# Patient Record
Sex: Female | Born: 1945 | Race: Asian | Hispanic: No | Marital: Married | Smoking: Never smoker
Health system: Southern US, Community
[De-identification: ages and names within clinical notes are randomized; demographics above are authoritative.]

## PROBLEM LIST (undated history)

## (undated) DIAGNOSIS — K219 Gastro-esophageal reflux disease without esophagitis: Secondary | ICD-10-CM

## (undated) DIAGNOSIS — I10 Essential (primary) hypertension: Secondary | ICD-10-CM

## (undated) DIAGNOSIS — E785 Hyperlipidemia, unspecified: Secondary | ICD-10-CM

## (undated) DIAGNOSIS — M1990 Unspecified osteoarthritis, unspecified site: Secondary | ICD-10-CM

## (undated) DIAGNOSIS — R7303 Prediabetes: Secondary | ICD-10-CM

## (undated) HISTORY — DX: Hyperlipidemia, unspecified: E78.5

## (undated) HISTORY — DX: Essential (primary) hypertension: I10

## (undated) HISTORY — PX: COLONOSCOPY: SHX174

---

## 2013-09-28 HISTORY — PX: EYE SURGERY: SHX253

## 2015-09-29 HISTORY — PX: CATARACT EXTRACTION, BILATERAL: SHX1313

## 2017-10-18 ENCOUNTER — Ambulatory Visit: Payer: Medicaid Other | Attending: Internal Medicine | Admitting: Internal Medicine

## 2017-10-18 ENCOUNTER — Encounter: Payer: Self-pay | Admitting: Internal Medicine

## 2017-10-18 VITALS — BP 138/84 | HR 61 | Temp 98.2°F | Resp 16 | Ht 61.0 in | Wt 182.0 lb

## 2017-10-18 DIAGNOSIS — G8929 Other chronic pain: Secondary | ICD-10-CM

## 2017-10-18 DIAGNOSIS — E785 Hyperlipidemia, unspecified: Secondary | ICD-10-CM | POA: Diagnosis not present

## 2017-10-18 DIAGNOSIS — E669 Obesity, unspecified: Secondary | ICD-10-CM | POA: Diagnosis not present

## 2017-10-18 DIAGNOSIS — Z6830 Body mass index (BMI) 30.0-30.9, adult: Secondary | ICD-10-CM | POA: Diagnosis not present

## 2017-10-18 DIAGNOSIS — I10 Essential (primary) hypertension: Secondary | ICD-10-CM | POA: Insufficient documentation

## 2017-10-18 DIAGNOSIS — H5712 Ocular pain, left eye: Secondary | ICD-10-CM | POA: Diagnosis not present

## 2017-10-18 DIAGNOSIS — M5416 Radiculopathy, lumbar region: Secondary | ICD-10-CM

## 2017-10-18 DIAGNOSIS — R300 Dysuria: Secondary | ICD-10-CM | POA: Diagnosis not present

## 2017-10-18 DIAGNOSIS — M5410 Radiculopathy, site unspecified: Secondary | ICD-10-CM | POA: Diagnosis not present

## 2017-10-18 DIAGNOSIS — Z1231 Encounter for screening mammogram for malignant neoplasm of breast: Secondary | ICD-10-CM | POA: Diagnosis not present

## 2017-10-18 DIAGNOSIS — Z1239 Encounter for other screening for malignant neoplasm of breast: Secondary | ICD-10-CM

## 2017-10-18 DIAGNOSIS — M170 Bilateral primary osteoarthritis of knee: Secondary | ICD-10-CM | POA: Insufficient documentation

## 2017-10-18 LAB — POCT URINALYSIS DIPSTICK
Bilirubin, UA: NEGATIVE
Blood, UA: NEGATIVE
Glucose, UA: NEGATIVE
KETONES UA: NEGATIVE
Leukocytes, UA: NEGATIVE
NITRITE UA: NEGATIVE
PROTEIN UA: NEGATIVE
Spec Grav, UA: 1.01 (ref 1.010–1.025)
Urobilinogen, UA: 0.2 E.U./dL
pH, UA: 7 (ref 5.0–8.0)

## 2017-10-18 MED ORDER — ATORVASTATIN CALCIUM 10 MG PO TABS: 10.0000 mg | ORAL_TABLET | Freq: Every day | ORAL | 3 refills | Status: DC

## 2017-10-18 MED ORDER — MELOXICAM 15 MG PO TABS: 15.0000 mg | ORAL_TABLET | Freq: Every day | ORAL | 0 refills | Status: DC

## 2017-10-18 MED ORDER — METOPROLOL TARTRATE 50 MG PO TABS: 50.0000 mg | ORAL_TABLET | ORAL | 3 refills | Status: DC

## 2017-10-18 MED ORDER — FUROSEMIDE 20 MG PO TABS: 20.0000 mg | ORAL_TABLET | Freq: Every day | ORAL | 3 refills | Status: DC

## 2017-10-18 NOTE — Patient Instructions (Signed)
Please sign a release today for me to get your medical records from your physician in [place].   Try to walk several times a week for exercise and to lose some weight.  Regular exercise and weight loss will help with knee pain.  Stop Advil.  Use Meloxicam for the knee and back pain.  Try to schedule an eye appointment with an optometrist or ophthalmologist as soon as possible.

## 2017-10-18 NOTE — Progress Notes (Addendum)
Patient ID: Katie Burns, female    DOB: Nov 02, 1945  MRN: [id number]  CC: New Patient (Initial Visit)   Subjective: Katie Burns is a 72 y.o. female who presents for new pt visit.  Katie Burns from [employer] and daughter, Katie Burns are with her.  Her concerns today include:  Pt from [place].  [place].  Pt with hx of HTN, HL LBP  HTN:  No device to check BP.  Compliant with meds -does not limit salt -LE edema intermittently.  No CP/SOB Does not get in much exercise There are no active problems to display for this patient.   Pain in knees and behind  Knees LT>RT x 10 yrs Never had x-rays Little swelling.  Locking in LT knee when she stands up for too long.  No morning stiffness No falls Takes Advil one pill daily with good relief.  LBP:  Constant pain that waxes and wanes Had x-rays done in [place] a long time ago and told she has disc problem Worse with bending and lifting Some radiation down legs.  No numbness in legs.    HM: due for MMG. Had flu shot in 06/2017. Never had colonoscopy.   No current outpatient medications on file prior to visit.   No current facility-administered medications on file prior to visit.     No Known Allergies  Social History   Socioeconomic History  . Marital status: Married    Spouse name: Not on file  . Number of children: 6  . Years of education: 5 th grade  . Highest education level: Not on file  Social Needs  . Financial resource strain: Not on file  . Food insecurity - worry: Not on file  . Food insecurity - inability: Not on file  . Transportation needs - medical: Not on file  . Transportation needs - non-medical: Not on file  Occupational History  . Occupation: unempolyed  Tobacco Use  . Smoking status: Never Smoker  Substance and Sexual Activity  . Alcohol use: No    Frequency: Never  . Drug use: No  . Sexual activity: Not on file  Other Topics Concern  . Not on file  Social History  Narrative  . Not on file    Family History  Problem Relation Age of Onset  . Hypertension Daughter     ROS: Review of Systems  Eyes: Positive for visual disturbance (was having some pain in LT eye).  Gastrointestinal: Negative for abdominal pain, blood in stool and constipation.  Genitourinary: Positive for dysuria. Negative for hematuria.       Reports frequent UTI. Last 05/2017  Neurological: Negative for dizziness and seizures.  Psychiatric/Behavioral: Negative for dysphoric mood. The patient is not nervous/anxious.     PHYSICAL EXAM: BP 138/84   Pulse 61   Temp 98.2 F (36.8 C) (Oral)   Resp 16   Ht 5\' 1"  (1.549 m)   Wt 182 lb (82.6 kg)   SpO2 95%   BMI 34.39 kg/m   Physical Exam  General appearance - alert, well appearing, obese pleasant elderly [place] female and in no distress Mental status - alert, oriented to person, place, and time, normal mood, behavior, speech, dress, motor activity, and thought processes Eyes - pupils equal and reactive, extraocular eye movements intact Mouth - mucous membranes moist, pharynx normal without lesions Neck - supple, no significant adenopathy Chest - clear to auscultation, no wheezes, rales or rhonchi, symmetric air entry Heart - normal  rate, regular rhythm, normal S1, S2, no murmurs, rubs, clicks or gallops Musculoskeletal -knees: Large body habitus.  Mild tenderness on palpation of medial joint lines bilaterally mild crepitus on passive movement  No tenderness on palpation of the lumbar spine.  Mild tenderness on palpation over both SI joints. extremities -no lower extremity edema.  Spider veins on both thighs.  Results for orders placed or performed in visit on 10/18/17  Urinalysis Dipstick  Result Value Ref Range   Color, UA yellow    Clarity, UA clear    Glucose, UA negative    Bilirubin, UA negative    Ketones, UA negative    Spec Grav, UA 1.010 1.010 - 1.025   Blood, UA negative    pH, UA 7.0 5.0 - 8.0   Protein, UA  negative    Urobilinogen, UA 0.2 0.2 or 1.0 E.U./dL   Nitrite, UA negative    Leukocytes, UA Negative Negative   Appearance     Odor      ASSESSMENT AND PLAN: 1. Essential hypertension At goal.  Refill given on metoprolol and furosemide.  Low-salt diet encouraged. - furosemide (LASIX) 20 MG tablet; Take 1 tablet (20 mg total) by mouth daily.  Dispense: 90 tablet; Refill: 3 - metoprolol tartrate (LOPRESSOR) 50 MG tablet; Take 1 tablet (50 mg total) by mouth 1 day or 1 dose.  Dispense: 90 tablet; Refill: 3 - CBC - Comprehensive metabolic panel  2. Hyperlipidemia, unspecified hyperlipidemia type - atorvastatin (LIPITOR) 10 MG tablet; Take 1 tablet (10 mg total) by mouth daily.  Dispense: 90 tablet; Refill: 3 - Lipid panel  3. Primary osteoarthritis of both knees Stop Advil.  Start meloxicam. Encourage her to walk a few times a week. Encourage weight loss. - meloxicam (MOBIC) 15 MG tablet; Take 1 tablet (15 mg total) by mouth daily.  Dispense: 30 tablet; Refill: 0 - DG Knee Complete 4 Views Left; Future - DG Knee Complete 4 Views Right; Future  4. Chronic radicular pain of lower back Weight loss encouraged. - DG Lumbar Spine Complete; Future  5. Dysuria - Urinalysis Dipstick  6. Pain of left eye Advised patient's daughter to call and schedule an appointment for her to see an optometrist or ophthalmologist  7. Breast cancer screening - MM Digital Screening; Future  8. Obesity (BMI 30.0-34.9) See #3 above.  Patient to sign a release for me to get her records from her previous physician in [place].  Patient was given the opportunity to ask questions.  Patient verbalized understanding of the plan and was able to repeat key elements of the plan.    Addendum:  X-rays of knees show significant OA with loose body.  X-ray of LS spine revealed DDD.   Will refer to orthopedics. Orders Placed This Encounter  Procedures  . MM Digital Screening  . DG Knee Complete 4 Views Left    . DG Knee Complete 4 Views Right  . DG Lumbar Spine Complete  . CBC  . Comprehensive metabolic panel  . Lipid panel  . Urinalysis Dipstick     Requested Prescriptions   Signed Prescriptions Disp Refills  . atorvastatin (LIPITOR) 10 MG tablet 90 tablet 3    Sig: Take 1 tablet (10 mg total) by mouth daily.  . furosemide (LASIX) 20 MG tablet 90 tablet 3    Sig: Take 1 tablet (20 mg total) by mouth daily.  . metoprolol tartrate (LOPRESSOR) 50 MG tablet 90 tablet 3    Sig: Take 1 tablet (  50 mg total) by mouth 1 day or 1 dose.  . meloxicam (MOBIC) 15 MG tablet 30 tablet 0    Sig: Take 1 tablet (15 mg total) by mouth daily.    Return in about 3 months (around 01/16/2018).  Jonah Blue, MD, FACP

## 2017-10-19 ENCOUNTER — Ambulatory Visit (HOSPITAL_COMMUNITY)
Admission: RE | Admit: 2017-10-19 | Discharge: 2017-10-19 | Disposition: A | Discharge status: Home / Self Care | Payer: Medicaid Other | Source: Ambulatory Visit | Attending: Internal Medicine | Admitting: Internal Medicine

## 2017-10-19 DIAGNOSIS — M2341 Loose body in knee, right knee: Secondary | ICD-10-CM | POA: Insufficient documentation

## 2017-10-19 DIAGNOSIS — R937 Abnormal findings on diagnostic imaging of other parts of musculoskeletal system: Secondary | ICD-10-CM | POA: Insufficient documentation

## 2017-10-19 DIAGNOSIS — M170 Bilateral primary osteoarthritis of knee: Secondary | ICD-10-CM | POA: Diagnosis not present

## 2017-10-19 DIAGNOSIS — M5416 Radiculopathy, lumbar region: Secondary | ICD-10-CM | POA: Diagnosis not present

## 2017-10-19 DIAGNOSIS — G8929 Other chronic pain: Secondary | ICD-10-CM | POA: Diagnosis not present

## 2017-10-19 DIAGNOSIS — M5137 Other intervertebral disc degeneration, lumbosacral region: Secondary | ICD-10-CM | POA: Insufficient documentation

## 2017-10-19 LAB — COMPREHENSIVE METABOLIC PANEL
A/G RATIO: 1.2 (ref 1.2–2.2)
ALBUMIN: 4.3 g/dL (ref 3.5–4.8)
ALT: 11 IU/L (ref 0–32)
AST: 16 IU/L (ref 0–40)
Alkaline Phosphatase: 92 IU/L (ref 39–117)
BILIRUBIN TOTAL: 0.2 mg/dL (ref 0.0–1.2)
BUN / CREAT RATIO: 22 (ref 12–28)
BUN: 15 mg/dL (ref 8–27)
CHLORIDE: 101 mmol/L (ref 96–106)
CO2: 23 mmol/L (ref 20–29)
Calcium: 9.9 mg/dL (ref 8.7–10.3)
Creatinine, Ser: 0.68 mg/dL (ref 0.57–1.00)
GFR calc non Af Amer: 89 mL/min/{1.73_m2} (ref >59)
GFR, EST AFRICAN AMERICAN: 102 mL/min/{1.73_m2} (ref >59)
GLOBULIN, TOTAL: 3.5 g/dL (ref 1.5–4.5)
Glucose: 95 mg/dL (ref 65–99)
POTASSIUM: 4.1 mmol/L (ref 3.5–5.2)
SODIUM: 140 mmol/L (ref 134–144)
TOTAL PROTEIN: 7.8 g/dL (ref 6.0–8.5)

## 2017-10-19 LAB — CBC
HEMATOCRIT: 36.4 % (ref 34.0–46.6)
Hemoglobin: 12.6 g/dL (ref 11.1–15.9)
MCH: 30.5 pg (ref 26.6–33.0)
MCHC: 34.6 g/dL (ref 31.5–35.7)
MCV: 88 fL (ref 79–97)
PLATELETS: 217 10*3/uL (ref 150–379)
RBC: 4.13 x10E6/uL (ref 3.77–5.28)
RDW: 13.1 % (ref 12.3–15.4)
WBC: 10.8 10*3/uL (ref 3.4–10.8)

## 2017-10-19 LAB — LIPID PANEL
CHOL/HDL RATIO: 3.4 ratio (ref 0.0–4.4)
Cholesterol, Total: 244 mg/dL — ABNORMAL HIGH (ref 100–199)
HDL: 71 mg/dL (ref >39)
LDL Calculated: 144 mg/dL — ABNORMAL HIGH (ref 0–99)
Triglycerides: 144 mg/dL (ref 0–149)
VLDL Cholesterol Cal: 29 mg/dL (ref 5–40)

## 2017-10-19 NOTE — Addendum Note (Signed)
Addended by: Jonah BlueJOHNSON, DEBORAH B on: 10/19/2017 02:06 PM   Modules accepted: Orders

## 2017-10-25 ENCOUNTER — Ambulatory Visit (INDEPENDENT_AMBULATORY_CARE_PROVIDER_SITE_OTHER): Payer: Self-pay | Admitting: Orthopaedic Surgery

## 2017-11-04 ENCOUNTER — Encounter (INDEPENDENT_AMBULATORY_CARE_PROVIDER_SITE_OTHER): Payer: Self-pay | Admitting: Orthopaedic Surgery

## 2017-11-04 ENCOUNTER — Ambulatory Visit (INDEPENDENT_AMBULATORY_CARE_PROVIDER_SITE_OTHER): Payer: Medicaid Other | Admitting: Orthopaedic Surgery

## 2017-11-04 ENCOUNTER — Ambulatory Visit (INDEPENDENT_AMBULATORY_CARE_PROVIDER_SITE_OTHER): Payer: Self-pay | Admitting: Orthopaedic Surgery

## 2017-11-04 DIAGNOSIS — M1711 Unilateral primary osteoarthritis, right knee: Secondary | ICD-10-CM | POA: Diagnosis not present

## 2017-11-04 DIAGNOSIS — M1712 Unilateral primary osteoarthritis, left knee: Secondary | ICD-10-CM

## 2017-11-04 NOTE — Progress Notes (Signed)
   Office Visit Note   Patient: Katie Burns           Date of Birth: 19-Oct-1945           MRN: [id number] Visit Date: 11/04/2017              Requested by: Marcine MatarJohnson, Deborah B, MD [address] [place], [place] [id number] PCP: Marcine MatarJohnson, Deborah B, MD   Assessment & Plan: Visit Diagnoses:  1. Unilateral primary osteoarthritis, left knee   2. Unilateral primary osteoarthritis, right knee     Plan: Impression is 72 year old female with advanced bilateral knee degenerative joint disease.  Patient declined injections and does not want a total knee replacement.  She wants to continue to use meloxicam as needed.  Questions encouraged and answered.  Follow-up as needed.  Follow-Up Instructions: Return if symptoms worsen or fail to improve.   Orders:  No orders of the defined types were placed in this encounter.  No orders of the defined types were placed in this encounter.     Procedures: No procedures performed   Clinical Data: No additional findings.   Subjective: Chief Complaint  Patient presents with  . Right Knee - Pain  . Left Knee - Pain     Patient is a 72 year old [place] female comes in with bilateral knee pain for 6 months.  Denies any injuries.  The pain is worse with activity and standing.  Denies any numbness and tingling.  Currently takes meloxicam with partial relief.    Review of Systems  Constitutional: Negative.   HENT: Negative.   Eyes: Negative.   Respiratory: Negative.   Cardiovascular: Negative.   Endocrine: Negative.   Musculoskeletal: Negative.   Neurological: Negative.   Hematological: Negative.   Psychiatric/Behavioral: Negative.   All other systems reviewed and are negative.    Objective: Vital Signs: There were no vitals taken for this visit.  Physical Exam  Constitutional: She is oriented to person, place, and time. She appears well-developed and well-nourished.  HENT:  Head: Normocephalic and atraumatic.  Eyes: EOM are normal.    Neck: Neck supple.  Pulmonary/Chest: Effort normal.  Abdominal: Soft.  Neurological: She is alert and oriented to person, place, and time.  Skin: Skin is warm. Capillary refill takes less than 2 seconds.  Psychiatric: She has a normal mood and affect. Her behavior is normal. Judgment and thought content normal.  Nursing note and vitals reviewed.   Ortho Exam Bilateral knee exam shows no joint effusion.  Collaterals and cruciates are stable.  Positive patellar crepitus. Specialty Comments:  No specialty comments available.  Imaging: No results found.   PMFS History: Patient Active Problem List   Diagnosis Date Noted  . Essential hypertension 10/18/2017  . Hyperlipidemia 10/18/2017  . Primary osteoarthritis of both knees 10/18/2017  . Obesity (BMI 30.0-34.9) 10/18/2017   Past Medical History:  Diagnosis Date  . Hyperlipidemia   . Hypertension     Family History  Problem Relation Age of Onset  . Hypertension Daughter     Past Surgical History:  Procedure Laterality Date  . EYE SURGERY Bilateral 2015   cataract extractionwith lens implant   Social History   Occupational History  . Occupation: unempolyed  Tobacco Use  . Smoking status: Never Smoker  . Smokeless tobacco: Never Used  Substance and Sexual Activity  . Alcohol use: No    Frequency: Never  . Drug use: No  . Sexual activity: Not on file

## 2017-12-02 ENCOUNTER — Telehealth: Payer: Self-pay | Admitting: Internal Medicine

## 2017-12-02 DIAGNOSIS — M170 Bilateral primary osteoarthritis of knee: Secondary | ICD-10-CM

## 2017-12-02 MED ORDER — MELOXICAM 15 MG PO TABS: 15.0000 mg | ORAL_TABLET | Freq: Every day | ORAL | 0 refills | Status: DC

## 2017-12-02 NOTE — Telephone Encounter (Signed)
Refilled

## 2017-12-02 NOTE — Telephone Encounter (Signed)
Pt. called requesting a refill on meloxicam (MOBIC) 15 MG tablet Pt. Uses [facility] [address] Please f/u

## 2017-12-03 ENCOUNTER — Ambulatory Visit: Payer: Medicaid Other

## 2017-12-22 ENCOUNTER — Ambulatory Visit
Admission: RE | Admit: 2017-12-22 | Discharge: 2017-12-22 | Disposition: A | Discharge status: Home / Self Care | Payer: Self-pay | Source: Ambulatory Visit | Attending: Internal Medicine | Admitting: Internal Medicine

## 2017-12-22 DIAGNOSIS — Z1239 Encounter for other screening for malignant neoplasm of breast: Secondary | ICD-10-CM

## 2017-12-22 DIAGNOSIS — Z1231 Encounter for screening mammogram for malignant neoplasm of breast: Secondary | ICD-10-CM | POA: Diagnosis not present

## 2017-12-24 ENCOUNTER — Telehealth: Payer: Self-pay

## 2017-12-24 NOTE — Telephone Encounter (Signed)
Contacted pt to go over mm results spoke with pt son in law and made aware of results and he doesn't have any questions or concerns

## 2018-01-17 ENCOUNTER — Ambulatory Visit: Payer: Medicaid Other | Attending: Internal Medicine | Admitting: Internal Medicine

## 2018-01-17 ENCOUNTER — Encounter: Payer: Self-pay | Admitting: Internal Medicine

## 2018-01-17 VITALS — BP 126/72 | HR 69 | Temp 97.7°F | Resp 16 | Wt 177.0 lb

## 2018-01-17 DIAGNOSIS — I10 Essential (primary) hypertension: Secondary | ICD-10-CM | POA: Insufficient documentation

## 2018-01-17 DIAGNOSIS — H5712 Ocular pain, left eye: Secondary | ICD-10-CM | POA: Insufficient documentation

## 2018-01-17 DIAGNOSIS — E669 Obesity, unspecified: Secondary | ICD-10-CM | POA: Diagnosis not present

## 2018-01-17 DIAGNOSIS — Z1211 Encounter for screening for malignant neoplasm of colon: Secondary | ICD-10-CM | POA: Diagnosis not present

## 2018-01-17 DIAGNOSIS — E785 Hyperlipidemia, unspecified: Secondary | ICD-10-CM | POA: Diagnosis not present

## 2018-01-17 DIAGNOSIS — Z9842 Cataract extraction status, left eye: Secondary | ICD-10-CM | POA: Diagnosis not present

## 2018-01-17 DIAGNOSIS — M170 Bilateral primary osteoarthritis of knee: Secondary | ICD-10-CM | POA: Diagnosis not present

## 2018-01-17 DIAGNOSIS — Z9841 Cataract extraction status, right eye: Secondary | ICD-10-CM | POA: Diagnosis not present

## 2018-01-17 DIAGNOSIS — Z79899 Other long term (current) drug therapy: Secondary | ICD-10-CM | POA: Insufficient documentation

## 2018-01-17 DIAGNOSIS — H538 Other visual disturbances: Secondary | ICD-10-CM

## 2018-01-17 DIAGNOSIS — Z6833 Body mass index (BMI) 33.0-33.9, adult: Secondary | ICD-10-CM | POA: Diagnosis not present

## 2018-01-17 MED ORDER — MELOXICAM 15 MG PO TABS: 15.0000 mg | ORAL_TABLET | Freq: Every day | ORAL | 6 refills | Status: DC

## 2018-01-17 NOTE — Progress Notes (Signed)
Patient ID: Katie Burns, female    DOB: [birth year]  MRN: [id number]  CC: Hypertension   Subjective: Jennaya Pogue is a 72 y.o. female who presents for chronic ds management.  Riz, from [employer] and pt's daughter are with her Her concerns today include:  Pt with hx of HTN, HL, LBP, obesity and OA knees  1.  OA:  X-rays reveal significant OA of knees.  Saw Dr. Roda Shutters.  Pt declined joint injection and consideration for knee replacement surgery.  She reports that she is doing well on the meloxicam.  She is able to climb steps without any problems.  She has been walking more.  Denies any falls..    2.  HTN:  Compliant with Metoprolol and salt restriction.  No chest pains or shortness of breath.  No swelling in the legs.  She is compliant with metoprolol.  3.  Pain Lt eye and draining x 3-4 mths.  Breasts referral to eye doctor. -had cataract extraction BL 2 yrs ago.  HM: reports having had her pneumonia  And Tdap vaccines in [place].  Never had colonoscopy. Patient Active Problem List   Diagnosis Date Noted  . Essential hypertension 10/18/2017  . Hyperlipidemia 10/18/2017  . Primary osteoarthritis of both knees 10/18/2017  . Obesity (BMI 30.0-34.9) 10/18/2017     Current Outpatient Medications on File Prior to Visit  Medication Sig Dispense Refill  . atorvastatin (LIPITOR) 10 MG tablet Take 1 tablet (10 mg total) by mouth daily. 90 tablet 3  . furosemide (LASIX) 20 MG tablet Take 1 tablet (20 mg total) by mouth daily. 90 tablet 3  . metoprolol tartrate (LOPRESSOR) 50 MG tablet Take 1 tablet (50 mg total) by mouth 1 day or 1 dose. 90 tablet 3   No current facility-administered medications on file prior to visit.     No Known Allergies  Social History   Socioeconomic History  . Marital status: Married    Spouse name: Not on file  . Number of children: 6  . Years of education: 5 th grade  . Highest education level: Not on file  Occupational History  . Occupation:  unempolyed  Social Needs  . Financial resource strain: Not on file  . Food insecurity:    Worry: Not on file    Inability: Not on file  . Transportation needs:    Medical: Not on file    Non-medical: Not on file  Tobacco Use  . Smoking status: Never Smoker  . Smokeless tobacco: Never Used  Substance and Sexual Activity  . Alcohol use: No    Frequency: Never  . Drug use: No  . Sexual activity: Not on file  Lifestyle  . Physical activity:    Days per week: Not on file    Minutes per session: Not on file  . Stress: Not on file  Relationships  . Social connections:    Talks on phone: Not on file    Gets together: Not on file    Attends religious service: Not on file    Active member of club or organization: Not on file    Attends meetings of clubs or organizations: Not on file    Relationship status: Not on file  . Intimate partner violence:    Fear of current or ex partner: Not on file    Emotionally abused: Not on file    Physically abused: Not on file    Forced sexual activity: Not on file  Other Topics  Concern  . Not on file  Social History Narrative  . Not on file    Family History  Problem Relation Age of Onset  . Hypertension Daughter   . Breast cancer Neg Hx     Past Surgical History:  Procedure Laterality Date  . CATARACT EXTRACTION, BILATERAL Bilateral 2017   with lens implant  . EYE SURGERY Bilateral 2015   cataract extractionwith lens implant    ROS: Review of Systems Negative except as stated above. PHYSICAL EXAM: BP 126/72   Pulse 69   Temp 97.7 F (36.5 C) (Oral)   Resp 16   Wt 177 lb (80.3 kg)   SpO2 96%   BMI 33.44 kg/m   Physical Exam  General appearance - alert, well appearing, older female and in no distress Mental status -and answers questions appropriately Eyes: No conjunctival injection.  No drainage noted.  Extraocular movement intact. Neck - supple, no significant adenopathy Chest - clear to auscultation, no wheezes, rales  or rhonchi, symmetric air entry Heart - normal rate, regular rhythm, normal S1, S2, no murmurs, rubs, clicks or gallops Musculoskeletal -knees: Joints are enlarged.  No point tenderness.  Good range of motion.  Mild crepitus on passive range of motion of the right knee. Extremities - peripheral pulses normal, no pedal edema, no clubbing or cyanosis   ASSESSMENT AND PLAN: 1. Primary osteoarthritis of both knees Patient doing well on meloxicam.  Refill given.  Encourage her to walk as much as tolerated - meloxicam (MOBIC) 15 MG tablet; Take 1 tablet (15 mg total) by mouth daily.  Dispense: 30 tablet; Refill: 6  2. Essential hypertension At goal.  Continue metoprolol.  3. Blurred vision - Ambulatory referral to Ophthalmology  4. Colon cancer screening Discussed colon cancer screening with her.  Patient agreeable to having colonoscopy. - Ambulatory referral to Gastroenterology  Patient was given the opportunity to ask questions.  Patient verbalized understanding of the plan and was able to repeat key elements of the plan.   Orders Placed This Encounter  Procedures  . Ambulatory referral to Ophthalmology  . Ambulatory referral to Gastroenterology     Requested Prescriptions   Signed Prescriptions Disp Refills  . meloxicam (MOBIC) 15 MG tablet 30 tablet 6    Sig: Take 1 tablet (15 mg total) by mouth daily.    Return in about 4 months (around 05/19/2018).  Jonah Blueeborah Johnson, MD, FACP

## 2018-01-18 ENCOUNTER — Encounter: Payer: Self-pay | Admitting: Gastroenterology

## 2018-01-31 ENCOUNTER — Telehealth: Payer: Self-pay

## 2018-01-31 NOTE — Telephone Encounter (Signed)
Patient daughter called regarding to put an eye referral I checked she has a referral that was put in on 01/17/18 but there is no update or any notes. Patient daughter went to [facility] they told her that she needs a referral from her pcp, patient has a lot pian in the eyes and can not open her eyes

## 2018-01-31 NOTE — Telephone Encounter (Signed)
Noted   Patient request  . Sent Referral  To  [facility]  [address] [place] [address] [phone]  FAX [phone]

## 2018-02-02 DIAGNOSIS — H11002 Unspecified pterygium of left eye: Secondary | ICD-10-CM | POA: Diagnosis not present

## 2018-02-02 DIAGNOSIS — H538 Other visual disturbances: Secondary | ICD-10-CM | POA: Diagnosis not present

## 2018-02-02 DIAGNOSIS — H04123 Dry eye syndrome of bilateral lacrimal glands: Secondary | ICD-10-CM | POA: Diagnosis not present

## 2018-02-02 DIAGNOSIS — H16142 Punctate keratitis, left eye: Secondary | ICD-10-CM | POA: Diagnosis not present

## 2018-02-02 DIAGNOSIS — Z961 Presence of intraocular lens: Secondary | ICD-10-CM | POA: Diagnosis not present

## 2018-04-01 ENCOUNTER — Encounter: Payer: Self-pay | Admitting: Internal Medicine

## 2018-04-13 ENCOUNTER — Encounter: Payer: Medicaid Other | Admitting: Gastroenterology

## 2018-06-28 DIAGNOSIS — H04129 Dry eye syndrome of unspecified lacrimal gland: Secondary | ICD-10-CM | POA: Diagnosis not present

## 2018-06-28 DIAGNOSIS — H16142 Punctate keratitis, left eye: Secondary | ICD-10-CM | POA: Diagnosis not present

## 2018-07-18 ENCOUNTER — Ambulatory Visit: Payer: Medicaid Other | Attending: Internal Medicine | Admitting: Internal Medicine

## 2018-07-18 ENCOUNTER — Encounter: Payer: Self-pay | Admitting: Internal Medicine

## 2018-07-18 VITALS — BP 127/76 | HR 67 | Temp 98.7°F | Resp 16 | Wt 173.0 lb

## 2018-07-18 DIAGNOSIS — Z791 Long term (current) use of non-steroidal anti-inflammatories (NSAID): Secondary | ICD-10-CM | POA: Insufficient documentation

## 2018-07-18 DIAGNOSIS — E669 Obesity, unspecified: Secondary | ICD-10-CM | POA: Diagnosis not present

## 2018-07-18 DIAGNOSIS — Z6832 Body mass index (BMI) 32.0-32.9, adult: Secondary | ICD-10-CM | POA: Diagnosis not present

## 2018-07-18 DIAGNOSIS — M170 Bilateral primary osteoarthritis of knee: Secondary | ICD-10-CM | POA: Insufficient documentation

## 2018-07-18 DIAGNOSIS — Z23 Encounter for immunization: Secondary | ICD-10-CM | POA: Insufficient documentation

## 2018-07-18 DIAGNOSIS — Z8249 Family history of ischemic heart disease and other diseases of the circulatory system: Secondary | ICD-10-CM | POA: Diagnosis not present

## 2018-07-18 DIAGNOSIS — E785 Hyperlipidemia, unspecified: Secondary | ICD-10-CM | POA: Diagnosis not present

## 2018-07-18 DIAGNOSIS — I10 Essential (primary) hypertension: Secondary | ICD-10-CM | POA: Insufficient documentation

## 2018-07-18 MED ORDER — ATORVASTATIN CALCIUM 10 MG PO TABS: 10.0000 mg | ORAL_TABLET | Freq: Every day | ORAL | 3 refills | Status: DC

## 2018-07-18 MED ORDER — METOPROLOL TARTRATE 50 MG PO TABS: 50.0000 mg | ORAL_TABLET | ORAL | 3 refills | Status: DC

## 2018-07-18 MED ORDER — FUROSEMIDE 20 MG PO TABS: 20.0000 mg | ORAL_TABLET | Freq: Every day | ORAL | 3 refills | Status: DC

## 2018-07-18 NOTE — Patient Instructions (Signed)
Pneumococcal Conjugate Vaccine (PCV13) What You Need to Know 1. Why get vaccinated? Vaccination can protect both children and adults from pneumococcal disease. Pneumococcal disease is caused by bacteria that can spread from person to person through close contact. It can cause ear infections, and it can also lead to more serious infections of the:  Lungs (pneumonia),  Blood (bacteremia), and  Covering of the brain and spinal cord (meningitis).  Pneumococcal pneumonia is most common among adults. Pneumococcal meningitis can cause deafness and brain damage, and it kills about 1 child in 10 who get it. Anyone can get pneumococcal disease, but children under 2 years of age and adults 65 years and older, people with certain medical conditions, and cigarette smokers are at the highest risk. Before there was a vaccine, the [place] saw:  more than 700 cases of meningitis,  about 13,000 blood infections,  about 5 million ear infections, and  about 200 deaths  in children under 5 each year from pneumococcal disease. Since vaccine became available, severe pneumococcal disease in these children has fallen by 88%. About 18,000 older adults die of pneumococcal disease each year in the [place]. Treatment of pneumococcal infections with penicillin and other drugs is not as effective as it used to be, because some strains of the disease have become resistant to these drugs. This makes prevention of the disease, through vaccination, even more important. 2. PCV13 vaccine Pneumococcal conjugate vaccine (called PCV13) protects against 13 types of pneumococcal bacteria. PCV13 is routinely given to children at 2, 4, 6, and 12-15 months of age. It is also recommended for children and adults 2 to 64 years of age with certain health conditions, and for all adults 65 years of age and older. Your doctor can give you details. 3. Some people should not get this vaccine Anyone who has ever had a  life-threatening allergic reaction to a dose of this vaccine, to an earlier pneumococcal vaccine called PCV7, or to any vaccine containing diphtheria toxoid (for example, DTaP), should not get PCV13. Anyone with a severe allergy to any component of PCV13 should not get the vaccine. Tell your doctor if the person being vaccinated has any severe allergies. If the person scheduled for vaccination is not feeling well, your healthcare provider might decide to reschedule the shot on another day. 4. Risks of a vaccine reaction With any medicine, including vaccines, there is a chance of reactions. These are usually mild and go away on their own, but serious reactions are also possible. Problems reported following PCV13 varied by age and dose in the series. The most common problems reported among children were:  About half became drowsy after the shot, had a temporary loss of appetite, or had redness or tenderness where the shot was given.  About 1 out of 3 had swelling where the shot was given.  About 1 out of 3 had a mild fever, and about 1 in 20 had a fever over 102.2F.  Up to about 8 out of 10 became fussy or irritable.  Adults have reported pain, redness, and swelling where the shot was given; also mild fever, fatigue, headache, chills, or muscle pain. Young children who get PCV13 along with inactivated flu vaccine at the same time may be at increased risk for seizures caused by fever. Ask your doctor for more information. Problems that could happen after any vaccine:  People sometimes faint after a medical procedure, including vaccination. Sitting or lying down for about 15 minutes can help prevent   fainting, and injuries caused by a fall. Tell your doctor if you feel dizzy, or have vision changes or ringing in the ears.  Some older children and adults get severe pain in the shoulder and have difficulty moving the arm where a shot was given. This happens very rarely.  Any medication can cause a  severe allergic reaction. Such reactions from a vaccine are very rare, estimated at about 1 in a million doses, and would happen within a few minutes to a few hours after the vaccination. As with any medicine, there is a very small chance of a vaccine causing a serious injury or death. The safety of vaccines is always being monitored. For more information, visit: www.cdc.gov/vaccinesafety/ 5. What if there is a serious reaction? What should I look for? Look for anything that concerns you, such as signs of a severe allergic reaction, very high fever, or unusual behavior. Signs of a severe allergic reaction can include hives, swelling of the face and throat, difficulty breathing, a fast heartbeat, dizziness, and weakness-usually within a few minutes to a few hours after the vaccination. What should I do?  If you think it is a severe allergic reaction or other emergency that can't wait, call 9-1-1 or get the person to the nearest hospital. Otherwise, call your doctor.  Reactions should be reported to the Vaccine Adverse Event Reporting System (VAERS). Your doctor should file this report, or you can do it yourself through the VAERS web site at www.vaers.hhs.gov, or by calling 1-800-822-7967. ? VAERS does not give medical advice. 6. The National Vaccine Injury Compensation Program The National Vaccine Injury Compensation Program (VICP) is a federal program that was created to compensate people who may have been injured by certain vaccines. Persons who believe they may have been injured by a vaccine can learn about the program and about filing a claim by calling 1-800-338-2382 or visiting the VICP website at www.hrsa.gov/vaccinecompensation. There is a time limit to file a claim for compensation. 7. How can I learn more?  Ask your healthcare provider. He or she can give you the vaccine package insert or suggest other sources of information.  Call your local or state health department.  Contact the  Centers for Disease Control and Prevention (CDC): ? Call 1-800-232-4636 (1-800-CDC-INFO) or ? Visit CDC's website at www.cdc.gov/vaccines Vaccine Information Statement, PCV13 Vaccine (08/02/2014) This information is not intended to replace advice given to you by your health care provider. Make sure you discuss any questions you have with your health care provider. Document Released: 07/12/2006 Document Revised: 06/04/2016 Document Reviewed: 06/04/2016 Elsevier Interactive Patient Education  2017 Elsevier Inc.   Influenza Virus Vaccine injection (Fluarix) What is this medicine? INFLUENZA VIRUS VACCINE (in floo EN zuh VAHY ruhs vak SEEN) helps to reduce the risk of getting influenza also known as the flu. This medicine may be used for other purposes; ask your health care provider or pharmacist if you have questions. COMMON BRAND NAME(S): Fluarix, Fluzone What should I tell my health care provider before I take this medicine? They need to know if you have any of these conditions: -bleeding disorder like hemophilia -fever or infection -Guillain-Barre syndrome or other neurological problems -immune system problems -infection with the human immunodeficiency virus (HIV) or AIDS -low blood platelet counts -multiple sclerosis -an unusual or allergic reaction to influenza virus vaccine, eggs, chicken proteins, latex, gentamicin, other medicines, foods, dyes or preservatives -pregnant or trying to get pregnant -breast-feeding How should I use this medicine? This vaccine is for   injection into a muscle. It is given by a health care professional. A copy of Vaccine Information Statements will be given before each vaccination. Read this sheet carefully each time. The sheet may change frequently. Talk to your pediatrician regarding the use of this medicine in children. Special care may be needed. Overdosage: If you think you have taken too much of this medicine contact a poison control center or  emergency room at once. NOTE: This medicine is only for you. Do not share this medicine with others. What if I miss a dose? This does not apply. What may interact with this medicine? -chemotherapy or radiation therapy -medicines that lower your immune system like etanercept, anakinra, infliximab, and adalimumab -medicines that treat or prevent blood clots like warfarin -phenytoin -steroid medicines like prednisone or cortisone -theophylline -vaccines This list may not describe all possible interactions. Give your health care provider a list of all the medicines, herbs, non-prescription drugs, or dietary supplements you use. Also tell them if you smoke, drink alcohol, or use illegal drugs. Some items may interact with your medicine. What should I watch for while using this medicine? Report any side effects that do not go away within 3 days to your doctor or health care professional. Call your health care provider if any unusual symptoms occur within 6 weeks of receiving this vaccine. You may still catch the flu, but the illness is not usually as bad. You cannot get the flu from the vaccine. The vaccine will not protect against colds or other illnesses that may cause fever. The vaccine is needed every year. What side effects may I notice from receiving this medicine? Side effects that you should report to your doctor or health care professional as soon as possible: -allergic reactions like skin rash, itching or hives, swelling of the face, lips, or tongue Side effects that usually do not require medical attention (report to your doctor or health care professional if they continue or are bothersome): -fever -headache -muscle aches and pains -pain, tenderness, redness, or swelling at site where injected -weak or tired This list may not describe all possible side effects. Call your doctor for medical advice about side effects. You may report side effects to FDA at 1-800-FDA-1088. Where should I  keep my medicine? This vaccine is only given in a clinic, pharmacy, doctor's office, or other health care setting and will not be stored at home. NOTE: This sheet is a summary. It may not cover all possible information. If you have questions about this medicine, talk to your doctor, pharmacist, or health care provider.  2018 Elsevier/Gold Standard (2008-04-11 09:30:40)  

## 2018-07-18 NOTE — Progress Notes (Signed)
Patient ID: Katie Burns, female    DOB: 1946-05-03  MRN: [id number]  CC: Hypertension   Subjective: Katie Burns is a 72 y.o. female who presents for chronic ds management.   [employer] interpreter, Pharmacist, hospital, is with her.  Her concerns today include:  Pt with hx of HTN, HL, LBP, obesity and OA knees  OA knees:  Takes Mobic and reports good results with this.  She is very active.  She walks daily for about 30 minutes.  She also has stairs in her house and goes up and down the stairs without difficulty.  HTN:  No device to check BP she limits salt in foods No HA/dizziness/CP/SOB  Patient Active Problem List   Diagnosis Date Noted  . Essential hypertension 10/18/2017  . Hyperlipidemia 10/18/2017  . Primary osteoarthritis of both knees 10/18/2017  . Obesity (BMI 30.0-34.9) 10/18/2017     Current Outpatient Medications on File Prior to Visit  Medication Sig Dispense Refill  . meloxicam (MOBIC) 15 MG tablet Take 1 tablet (15 mg total) by mouth daily. 30 tablet 6   No current facility-administered medications on file prior to visit.     No Known Allergies  Social History   Socioeconomic History  . Marital status: Married    Spouse name: Not on file  . Number of children: 6  . Years of education: 5 th grade  . Highest education level: Not on file  Occupational History  . Occupation: unempolyed  Social Needs  . Financial resource strain: Not on file  . Food insecurity:    Worry: Not on file    Inability: Not on file  . Transportation needs:    Medical: Not on file    Non-medical: Not on file  Tobacco Use  . Smoking status: Never Smoker  . Smokeless tobacco: Never Used  Substance and Sexual Activity  . Alcohol use: No    Frequency: Never  . Drug use: No  . Sexual activity: Not on file  Lifestyle  . Physical activity:    Days per week: Not on file    Minutes per session: Not on file  . Stress: Not on file  Relationships  . Social connections:    Talks on  phone: Not on file    Gets together: Not on file    Attends religious service: Not on file    Active member of club or organization: Not on file    Attends meetings of clubs or organizations: Not on file    Relationship status: Not on file  . Intimate partner violence:    Fear of current or ex partner: Not on file    Emotionally abused: Not on file    Physically abused: Not on file    Forced sexual activity: Not on file  Other Topics Concern  . Not on file  Social History Narrative  . Not on file    Family History  Problem Relation Age of Onset  . Hypertension Daughter   . Breast cancer Neg Hx     Past Surgical History:  Procedure Laterality Date  . CATARACT EXTRACTION, BILATERAL Bilateral 2017   with lens implant  . EYE SURGERY Bilateral 2015   cataract extractionwith lens implant    ROS: Review of Systems  Constitutional: Negative for activity change and appetite change.  Respiratory: Negative for cough, chest tightness and shortness of breath.   Cardiovascular: Negative for chest pain and leg swelling.  Gastrointestinal: Negative for blood in stool.  Genitourinary:  Negative for difficulty urinating.  Psychiatric/Behavioral: Negative for dysphoric mood.    PHYSICAL EXAM: BP 127/76   Pulse 67   Temp 98.7 F (37.1 C) (Oral)   Resp 16   Wt 173 lb (78.5 kg)   SpO2 99%   BMI 32.69 kg/m   Physical Exam  General appearance - alert, well appearing, and in no distress Mental status - normal mood, behavior, speech, dress, motor activity, and thought processes Neck - supple, no significant adenopathy Chest - clear to auscultation, no wheezes, rales or rhonchi, symmetric air entry Heart - normal rate, regular rhythm, normal S1, S2, no murmurs, rubs, clicks or gallops Extremities - peripheral pulses normal, no pedal edema, no clubbing or cyanosis  ASSESSMENT AND PLAN: 1. Essential hypertension At goal.  Continue metoprolol - metoprolol tartrate (LOPRESSOR) 50 MG  tablet; Take 1 tablet (50 mg total) by mouth 1 day or 1 dose.  Dispense: 90 tablet; Refill: 3 - furosemide (LASIX) 20 MG tablet; Take 1 tablet (20 mg total) by mouth daily.  Dispense: 90 tablet; Refill: 3  2. Hyperlipidemia, unspecified hyperlipidemia type - atorvastatin (LIPITOR) 10 MG tablet; Take 1 tablet (10 mg total) by mouth daily.  Dispense: 90 tablet; Refill: 3  3. Primary osteoarthritis of both knees Doing well on meloxicam.  4. Need for influenza vaccination   5. Need for vaccination against Streptococcus pneumoniae using pneumococcal conjugate vaccine 13   Patient was given the opportunity to ask questions.  Patient verbalized understanding of the plan and was able to repeat key elements of the plan.   Orders Placed This Encounter  Procedures  . Pneumococcal conjugate vaccine 13-valent     Requested Prescriptions   Signed Prescriptions Disp Refills  . atorvastatin (LIPITOR) 10 MG tablet 90 tablet 3    Sig: Take 1 tablet (10 mg total) by mouth daily.  . metoprolol tartrate (LOPRESSOR) 50 MG tablet 90 tablet 3    Sig: Take 1 tablet (50 mg total) by mouth 1 day or 1 dose.  . furosemide (LASIX) 20 MG tablet 90 tablet 3    Sig: Take 1 tablet (20 mg total) by mouth daily.    Return in about 3 months (around 10/18/2018).  Jonah Blue, MD, FACP

## 2018-08-04 DIAGNOSIS — H11002 Unspecified pterygium of left eye: Secondary | ICD-10-CM | POA: Diagnosis not present

## 2018-08-04 DIAGNOSIS — H04129 Dry eye syndrome of unspecified lacrimal gland: Secondary | ICD-10-CM | POA: Diagnosis not present

## 2018-10-18 ENCOUNTER — Encounter: Payer: Self-pay | Admitting: Internal Medicine

## 2018-10-18 ENCOUNTER — Ambulatory Visit: Payer: Medicaid Other | Attending: Internal Medicine | Admitting: Internal Medicine

## 2018-10-18 VITALS — BP 134/70 | HR 66 | Temp 97.6°F | Resp 16 | Ht <= 58 in | Wt 174.0 lb

## 2018-10-18 DIAGNOSIS — E785 Hyperlipidemia, unspecified: Secondary | ICD-10-CM | POA: Insufficient documentation

## 2018-10-18 DIAGNOSIS — D649 Anemia, unspecified: Secondary | ICD-10-CM

## 2018-10-18 DIAGNOSIS — Z79899 Other long term (current) drug therapy: Secondary | ICD-10-CM | POA: Diagnosis not present

## 2018-10-18 DIAGNOSIS — M170 Bilateral primary osteoarthritis of knee: Secondary | ICD-10-CM

## 2018-10-18 DIAGNOSIS — Z791 Long term (current) use of non-steroidal anti-inflammatories (NSAID): Secondary | ICD-10-CM | POA: Diagnosis not present

## 2018-10-18 DIAGNOSIS — Z6836 Body mass index (BMI) 36.0-36.9, adult: Secondary | ICD-10-CM | POA: Insufficient documentation

## 2018-10-18 DIAGNOSIS — Z1211 Encounter for screening for malignant neoplasm of colon: Secondary | ICD-10-CM | POA: Diagnosis not present

## 2018-10-18 DIAGNOSIS — I10 Essential (primary) hypertension: Secondary | ICD-10-CM | POA: Diagnosis not present

## 2018-10-18 DIAGNOSIS — Z23 Encounter for immunization: Secondary | ICD-10-CM | POA: Diagnosis not present

## 2018-10-18 DIAGNOSIS — E669 Obesity, unspecified: Secondary | ICD-10-CM | POA: Diagnosis not present

## 2018-10-18 MED ORDER — ATORVASTATIN CALCIUM 10 MG PO TABS: 10.0000 mg | ORAL_TABLET | Freq: Every day | ORAL | 3 refills | Status: DC

## 2018-10-18 MED ORDER — MELOXICAM 15 MG PO TABS: 15.0000 mg | ORAL_TABLET | Freq: Every day | ORAL | 6 refills | Status: DC

## 2018-10-18 MED ORDER — FUROSEMIDE 20 MG PO TABS: 20.0000 mg | ORAL_TABLET | Freq: Every day | ORAL | 3 refills | Status: DC

## 2018-10-18 NOTE — Progress Notes (Addendum)
Patient ID: Katie Burns, female    DOB: 06-Oct-1945  MRN: [id number]  CC: Hypertension   Subjective: Katie Burns is a 73 y.o. female who presents for chronic disease management. Son-in-law, Katie Burns, is with and interprets Her concerns today include:  Pt with hx of HTN, HL, LBP, obesity and OA knees  HTN:  Has a device at home but does not check blood pressure.  Reports compliance with metoprolol and furosemide.  She limits salt in the foods.  Denies any chest pains or shortness of breath.  No lower extremity edema.  She has not taken medicines as yet for today Takes in evening  HL: Reports compliance with atorvastatin.  OA knees:  Takes meloxicam only when she feels pain.  Exercises at home by going up and down stairs.  She has not had any falls.  Patient Active Problem List   Diagnosis Date Noted  . Essential hypertension 10/18/2017  . Hyperlipidemia 10/18/2017  . Primary osteoarthritis of both knees 10/18/2017  . Obesity (BMI 30.0-34.9) 10/18/2017     Current Outpatient Medications on File Prior to Visit  Medication Sig Dispense Refill  . metoprolol tartrate (LOPRESSOR) 50 MG tablet Take 1 tablet (50 mg total) by mouth 1 day or 1 dose. 90 tablet 3   No current facility-administered medications on file prior to visit.     No Known Allergies  Social History   Socioeconomic History  . Marital status: Married    Spouse name: Not on file  . Number of children: 6  . Years of education: 5 th grade  . Highest education level: Not on file  Occupational History  . Occupation: unempolyed  Social Needs  . Financial resource strain: Not on file  . Food insecurity:    Worry: Not on file    Inability: Not on file  . Transportation needs:    Medical: Not on file    Non-medical: Not on file  Tobacco Use  . Smoking status: Never Smoker  . Smokeless tobacco: Never Used  Substance and Sexual Activity  . Alcohol use: No    Frequency: Never  . Drug use: No  . Sexual  activity: Not on file  Lifestyle  . Physical activity:    Days per week: Not on file    Minutes per session: Not on file  . Stress: Not on file  Relationships  . Social connections:    Talks on phone: Not on file    Gets together: Not on file    Attends religious service: Not on file    Active member of club or organization: Not on file    Attends meetings of clubs or organizations: Not on file    Relationship status: Not on file  . Intimate partner violence:    Fear of current or ex partner: Not on file    Emotionally abused: Not on file    Physically abused: Not on file    Forced sexual activity: Not on file  Other Topics Concern  . Not on file  Social History Narrative  . Not on file    Family History  Problem Relation Age of Onset  . Hypertension Daughter   . Breast cancer Neg Hx     Past Surgical History:  Procedure Laterality Date  . CATARACT EXTRACTION, BILATERAL Bilateral 2017   with lens implant  . EYE SURGERY Bilateral 2015   cataract extractionwith lens implant    ROS: Review of Systems Negative except as above. PHYSICAL EXAM:  BP 134/70   Pulse 66   Temp 97.6 F (36.4 C) (Oral)   Resp 16   Ht 4\' 10"  (1.473 m)   Wt 174 lb (78.9 kg)   SpO2 95%   BMI 36.37 kg/m   Wt Readings from Last 3 Encounters:  10/18/18 174 lb (78.9 kg)  07/18/18 173 lb (78.5 kg)  01/17/18 177 lb (80.3 kg)   Blood pressure 134/70 Physical Exam  General appearance - alert, well appearing, and in no distress Mental status - normal mood, behavior, speech, dress, motor activity, and thought processes Neck - supple, no significant adenopathy Chest - clear to auscultation, no wheezes, rales or rhonchi, symmetric air entry Heart - normal rate, regular rhythm, normal S1, S2, no murmurs, rubs, clicks or gallops Extremities - peripheral pulses normal, no pedal edema, no clubbing or cyanosis  ASSESSMENT AND PLAN: 1. Primary osteoarthritis of both knees Continue meloxicam which  she apparently has been taking only when she needs to.  Encouraged her to continue regular exercise as she has been doing. - CBC - Comprehensive metabolic panel - Lipid panel - meloxicam (MOBIC) 15 MG tablet; Take 1 tablet (15 mg total) by mouth daily.  Dispense: 30 tablet; Refill: 6  2. Hyperlipidemia, unspecified hyperlipidemia type - atorvastatin (LIPITOR) 10 MG tablet; Take 1 tablet (10 mg total) by mouth daily.  Dispense: 90 tablet; Refill: 3  3. Essential hypertension Dose to goal.  Continue current medications - furosemide (LASIX) 20 MG tablet; Take 1 tablet (20 mg total) by mouth daily.  Dispense: 90 tablet; Refill: 3  4. Colon cancer screening Discussed colon cancer screening with her.  She was referred to gastroenterology last year but apparently never got the appointment.  She is agreeable for me to submit the referral again - Ambulatory referral to Gastroenterology  Patient was given the opportunity to ask questions.  Patient verbalized understanding of the plan and was able to repeat key elements of the plan.   Addendum:  Pt with mild anemia.  Iron studies and Vit B12 level added. Orders Placed This Encounter  Procedures  . Tdap vaccine greater than or equal to 7yo IM  . CBC  . Comprehensive metabolic panel  . Lipid panel  . Ambulatory referral to Gastroenterology     Requested Prescriptions   Signed Prescriptions Disp Refills  . meloxicam (MOBIC) 15 MG tablet 30 tablet 6    Sig: Take 1 tablet (15 mg total) by mouth daily.  Marland Kitchen atorvastatin (LIPITOR) 10 MG tablet 90 tablet 3    Sig: Take 1 tablet (10 mg total) by mouth daily.  . furosemide (LASIX) 20 MG tablet 90 tablet 3    Sig: Take 1 tablet (20 mg total) by mouth daily.    Return in about 4 months (around 02/16/2019).  Jonah Blue, MD, FACP

## 2018-10-18 NOTE — Patient Instructions (Addendum)
Please check your blood pressure at least twice a week.  The goal is 130/80 or lower.  Continue to limit salt in the foods.  Td Vaccine (Tetanus and Diphtheria): What You Need to Know 1. Why get vaccinated? Tetanus  and diphtheria are very serious diseases. They are rare in the [place] today, but people who do become infected often have severe complications. Td vaccine is used to protect adolescents and adults from both of these diseases. Both tetanus and diphtheria are infections caused by bacteria. Diphtheria spreads from person to person through coughing or sneezing. Tetanus-causing bacteria enter the body through cuts, scratches, or wounds. TETANUS (Lockjaw) causes painful muscle tightening and stiffness, usually all over the body.  It can lead to tightening of muscles in the head and neck so you can't open your mouth, swallow, or sometimes even breathe. Tetanus kills about 1 out of every 10 people who are infected even after receiving the best medical care. DIPHTHERIA can cause a thick coating to form in the back of the throat.  It can lead to breathing problems, paralysis, heart failure, and death. Before vaccines, as many as 200,000 cases of diphtheria and hundreds of cases of tetanus were reported in the [place] each year. Since vaccination began, reports of cases for both diseases have dropped by about 99%. 2. Td vaccine Td vaccine can protect adolescents and adults from tetanus and diphtheria. Td is usually given as a booster dose every 10 years but it can also be given earlier after a severe and dirty wound or burn. Another vaccine, called Tdap, which protects against pertussis in addition to tetanus and diphtheria, is sometimes recommended instead of Td vaccine. Your doctor or the person giving you the vaccine can give you more information. Td may safely be given at the same time as other vaccines. 3. Some people should not get this vaccine  A person who has ever had a  life-threatening allergic reaction after a previous dose of any tetanus or diphtheria containing vaccine, OR has a severe allergy to any part of this vaccine, should not get Td vaccine. Tell the person giving the vaccine about any severe allergies.  Talk to your doctor if you: ? had severe pain or swelling after any vaccine containing diphtheria or tetanus, ? ever had a condition called Guillain Barr Syndrome (GBS), ? aren't feeling well on the day the shot is scheduled. 4. Risks of a vaccine reaction With any medicine, including vaccines, there is a chance of side effects. These are usually mild and go away on their own. Serious reactions are also possible but are rare. Most people who get Td vaccine do not have any problems with it. Mild Problems following Td vaccine: (Did not interfere with activities)  Pain where the shot was given (about 8 people in 10)  Redness or swelling where the shot was given (about 1 person in 4)  Mild fever (rare)  Headache (about 1 person in 4)  Tiredness (about 1 person in 4) Moderate Problems following Td vaccine: (Interfered with activities, but did not require medical attention)  Fever over 102F (rare) Severe Problems following Td vaccine: (Unable to perform usual activities; required medical attention)  Swelling, severe pain, bleeding and/or redness in the arm where the shot was given (rare). Problems that could happen after any vaccine:  People sometimes faint after a medical procedure, including vaccination. Sitting or lying down for about 15 minutes can help prevent fainting, and injuries caused by a fall. Tell  your doctor if you feel dizzy, or have vision changes or ringing in the ears.  Some people get severe pain in the shoulder and have difficulty moving the arm where a shot was given. This happens very rarely.  Any medication can cause a severe allergic reaction. Such reactions from a vaccine are very rare, estimated at fewer than 1 in  a million doses, and would happen within a few minutes to a few hours after the vaccination. As with any medicine, there is a very remote chance of a vaccine causing a serious injury or death. The safety of vaccines is always being monitored. For more information, visit: http://www.aguilar.org/ 5. What if there is a serious reaction? What should I look for?  Look for anything that concerns you, such as signs of a severe allergic reaction, very high fever, or unusual behavior. Signs of a severe allergic reaction can include hives, swelling of the face and throat, difficulty breathing, a fast heartbeat, dizziness, and weakness. These would usually start a few minutes to a few hours after the vaccination. What should I do?  If you think it is a severe allergic reaction or other emergency that can't wait, call 9-1-1 or get the person to the nearest hospital. Otherwise, call your doctor.  Afterward, the reaction should be reported to the Vaccine Adverse Event Reporting System (VAERS). Your doctor might file this report, or you can do it yourself through the VAERS web site at www.vaers.SamedayNews.es, or by calling 979-010-8697. VAERS does not give medical advice. 6. The National Vaccine Injury Compensation Program The [employer] Vaccine Injury Compensation Program (VICP) is a federal program that was created to compensate people who may have been injured by certain vaccines. Persons who believe they may have been injured by a vaccine can learn about the program and about filing a claim by calling 913-838-5829 or visiting the [place] website at GoldCloset.com.ee. There is a time limit to file a claim for compensation. 7. How can I learn more?  Ask your doctor. He or she can give you the vaccine package insert or suggest other sources of information.  Call your local or state health department.  Contact the Centers for Disease Control and Prevention (CDC): ? Call 670-744-4134  (1-800-CDC-INFO) ? Visit CDC's website at http://hunter.com/ Vaccine Information Statement Td Vaccine (01/07/16) This information is not intended to replace advice given to you by your health care provider. Make sure you discuss any questions you have with your health care provider. Document Released: 07/12/2006 Document Revised: 05/02/2018 Document Reviewed: 05/02/2018 Elsevier Interactive Patient Education  2019 [employer].

## 2018-10-19 ENCOUNTER — Other Ambulatory Visit: Payer: Self-pay | Admitting: Internal Medicine

## 2018-10-19 DIAGNOSIS — E785 Hyperlipidemia, unspecified: Secondary | ICD-10-CM

## 2018-10-19 LAB — COMPREHENSIVE METABOLIC PANEL
ALT: 13 IU/L (ref 0–32)
AST: 16 IU/L (ref 0–40)
Albumin/Globulin Ratio: 1.5 (ref 1.2–2.2)
Albumin: 4.3 g/dL (ref 3.7–4.7)
Alkaline Phosphatase: 91 IU/L (ref 39–117)
BUN/Creatinine Ratio: 24 (ref 12–28)
BUN: 20 mg/dL (ref 8–27)
Bilirubin Total: <0.2 mg/dL (ref 0.0–1.2)
CHLORIDE: 99 mmol/L (ref 96–106)
CO2: 20 mmol/L (ref 20–29)
CREATININE: 0.83 mg/dL (ref 0.57–1.00)
Calcium: 9.6 mg/dL (ref 8.7–10.3)
GFR calc Af Amer: 82 mL/min/{1.73_m2} (ref >59)
GFR calc non Af Amer: 71 mL/min/{1.73_m2} (ref >59)
GLUCOSE: 94 mg/dL (ref 65–99)
Globulin, Total: 2.8 g/dL (ref 1.5–4.5)
POTASSIUM: 4.2 mmol/L (ref 3.5–5.2)
Sodium: 138 mmol/L (ref 134–144)
Total Protein: 7.1 g/dL (ref 6.0–8.5)

## 2018-10-19 LAB — CBC
Hematocrit: 33.7 % — ABNORMAL LOW (ref 34.0–46.6)
Hemoglobin: 11.5 g/dL (ref 11.1–15.9)
MCH: 31 pg (ref 26.6–33.0)
MCHC: 34.1 g/dL (ref 31.5–35.7)
MCV: 91 fL (ref 79–97)
Platelets: 183 10*3/uL (ref 150–450)
RBC: 3.71 x10E6/uL — ABNORMAL LOW (ref 3.77–5.28)
RDW: 12.2 % (ref 11.7–15.4)
WBC: 9.6 10*3/uL (ref 3.4–10.8)

## 2018-10-19 LAB — LIPID PANEL
Chol/HDL Ratio: 3.3 ratio (ref 0.0–4.4)
Cholesterol, Total: 205 mg/dL — ABNORMAL HIGH (ref 100–199)
HDL: 62 mg/dL (ref >39)
LDL Calculated: 113 mg/dL — ABNORMAL HIGH (ref 0–99)
Triglycerides: 152 mg/dL — ABNORMAL HIGH (ref 0–149)
VLDL Cholesterol Cal: 30 mg/dL (ref 5–40)

## 2018-10-19 MED ORDER — ATORVASTATIN CALCIUM 20 MG PO TABS: 20.0000 mg | ORAL_TABLET | Freq: Every day | ORAL | 6 refills | Status: DC

## 2018-10-19 NOTE — Addendum Note (Signed)
Addended by: Jonah Blue B on: 10/19/2018 10:38 AM   Modules accepted: Orders

## 2018-10-21 ENCOUNTER — Telehealth: Payer: Self-pay

## 2018-10-21 NOTE — Telephone Encounter (Signed)
Contacted pt son to go over lab results pt son is aware and doesn't have any questions or concerns  °

## 2018-10-22 ENCOUNTER — Other Ambulatory Visit: Payer: Self-pay | Admitting: Internal Medicine

## 2018-10-22 LAB — IRON,TIBC AND FERRITIN PANEL
Ferritin: 39 ng/mL (ref 15–150)
Iron Saturation: 12 % — ABNORMAL LOW (ref 15–55)
Iron: 37 ug/dL (ref 27–139)
TIBC: 321 ug/dL (ref 250–450)
UIBC: 284 ug/dL (ref 118–369)

## 2018-10-22 LAB — VITAMIN B12: Vitamin B-12: 718 pg/mL (ref 232–1245)

## 2018-10-22 LAB — SPECIMEN STATUS REPORT

## 2018-10-22 MED ORDER — FERROUS SULFATE 325 (65 FE) MG PO TABS: 325.0000 mg | ORAL_TABLET | Freq: Every day | ORAL | 1 refills | Status: DC

## 2018-10-31 ENCOUNTER — Telehealth: Payer: Self-pay

## 2018-10-31 NOTE — Telephone Encounter (Signed)
Contacted pt pt/son to go over lab results left a detailed vm informing pt/son of results and if they have any questions or concerns to give me a call

## 2018-11-14 DIAGNOSIS — K219 Gastro-esophageal reflux disease without esophagitis: Secondary | ICD-10-CM | POA: Diagnosis not present

## 2018-11-14 DIAGNOSIS — D500 Iron deficiency anemia secondary to blood loss (chronic): Secondary | ICD-10-CM | POA: Diagnosis not present

## 2018-11-14 DIAGNOSIS — Z1211 Encounter for screening for malignant neoplasm of colon: Secondary | ICD-10-CM | POA: Diagnosis not present

## 2018-11-24 DIAGNOSIS — K449 Diaphragmatic hernia without obstruction or gangrene: Secondary | ICD-10-CM | POA: Diagnosis not present

## 2018-11-24 DIAGNOSIS — K222 Esophageal obstruction: Secondary | ICD-10-CM | POA: Diagnosis not present

## 2018-11-24 DIAGNOSIS — K2930 Chronic superficial gastritis without bleeding: Secondary | ICD-10-CM | POA: Diagnosis not present

## 2018-11-24 DIAGNOSIS — R12 Heartburn: Secondary | ICD-10-CM | POA: Diagnosis not present

## 2018-11-24 DIAGNOSIS — K210 Gastro-esophageal reflux disease with esophagitis: Secondary | ICD-10-CM | POA: Diagnosis not present

## 2018-11-24 DIAGNOSIS — Z1211 Encounter for screening for malignant neoplasm of colon: Secondary | ICD-10-CM | POA: Diagnosis not present

## 2018-11-24 DIAGNOSIS — D509 Iron deficiency anemia, unspecified: Secondary | ICD-10-CM | POA: Diagnosis not present

## 2018-11-24 DIAGNOSIS — B9681 Helicobacter pylori [H. pylori] as the cause of diseases classified elsewhere: Secondary | ICD-10-CM | POA: Diagnosis not present

## 2018-12-06 ENCOUNTER — Encounter: Payer: Self-pay | Admitting: Internal Medicine

## 2018-12-06 NOTE — Progress Notes (Signed)
Received note from [facility] gastroenterology Dr.Karki.  Patient had colonoscopy on 11/24/2018.  The entire colon was normal.  He also had EGD which revealed irregular Z line, widely patent scattered skis ring and 2 cm hiatal hernia.  Erythematous mucosa in the antrum was biopsied.

## 2018-12-28 ENCOUNTER — Encounter: Payer: Self-pay | Admitting: Internal Medicine

## 2018-12-28 NOTE — Progress Notes (Signed)
Had EGD done 12/02/2018. + h.pylori, no celiac or Barrett's.

## 2019-02-16 ENCOUNTER — Encounter: Payer: Self-pay | Admitting: Internal Medicine

## 2019-02-16 ENCOUNTER — Ambulatory Visit: Payer: Medicaid Other | Attending: Internal Medicine | Admitting: Internal Medicine

## 2019-02-16 ENCOUNTER — Other Ambulatory Visit: Payer: Self-pay

## 2019-02-16 VITALS — BP 150/82 | HR 64 | Temp 98.2°F | Resp 16 | Wt 173.6 lb

## 2019-02-16 DIAGNOSIS — D509 Iron deficiency anemia, unspecified: Secondary | ICD-10-CM | POA: Insufficient documentation

## 2019-02-16 DIAGNOSIS — I10 Essential (primary) hypertension: Secondary | ICD-10-CM | POA: Diagnosis not present

## 2019-02-16 DIAGNOSIS — M170 Bilateral primary osteoarthritis of knee: Secondary | ICD-10-CM | POA: Diagnosis not present

## 2019-02-16 DIAGNOSIS — E785 Hyperlipidemia, unspecified: Secondary | ICD-10-CM

## 2019-02-16 DIAGNOSIS — Z79899 Other long term (current) drug therapy: Secondary | ICD-10-CM | POA: Diagnosis not present

## 2019-02-16 DIAGNOSIS — E669 Obesity, unspecified: Secondary | ICD-10-CM | POA: Insufficient documentation

## 2019-02-16 DIAGNOSIS — Z8249 Family history of ischemic heart disease and other diseases of the circulatory system: Secondary | ICD-10-CM | POA: Insufficient documentation

## 2019-02-16 DIAGNOSIS — Z791 Long term (current) use of non-steroidal anti-inflammatories (NSAID): Secondary | ICD-10-CM | POA: Diagnosis not present

## 2019-02-16 MED ORDER — BLOOD PRESSURE MONITOR DEVI: 0 refills | Status: AC

## 2019-02-16 MED ORDER — AMLODIPINE BESYLATE 5 MG PO TABS: 2.5000 mg | ORAL_TABLET | Freq: Every day | ORAL | 3 refills | Status: DC

## 2019-02-16 NOTE — Patient Instructions (Signed)
Start Amlodipine 5 mg 1/2 tab daily for better blood pressure control.    I have sent a prescription to your pharmacy for a blood pressure monitoring device.  Check your blood pressure once a week.  Goal is 130/80 or lower.

## 2019-02-16 NOTE — Progress Notes (Signed)
Patient ID: Katie Burns, female    DOB: 01/09/1946  MRN: [id number]  CC: Hypertension   Subjective: Katie Burns is a 73 y.o. female who presents for chronic disease management.  Son-in-law, Dineen Kid, is with and interprets.  Patient was last seen in January. Her concerns today include:  Pt with hx of HTN, HL, LBP, obesity and OA knees  Anemia: Since last visit with me iron studies was suggestive of early iron deficiency anemia.  Saw [facility] and had EGD.  This revealed hiatal hernia and erythematous mucosa in the antrum.  Tested positive for H. pylori and was treated.  Colonoscopy was essentially normal.  She continues iron supplement.  HTN: No device at home but would like to get one. Reports compliance with metoprolol. Denies chest pain, shortness of breath, lower extremity edema, PND or palpitations.  She tries to limit salt in the foods.  HL: On last visit cholesterol was not at goal.  We increased the Lipitor to 20 mg daily.  She has her bottles with her.  She has bottles for both 10 mg and 20 mg tablets.  Obesity/OA of the knees: Reports that the knees are doing okay on meloxicam.  She tries to walk several times a week.  Doing okay with eating habits.    Patient Active Problem List   Diagnosis Date Noted  . Essential hypertension 10/18/2017  . Hyperlipidemia 10/18/2017  . Primary osteoarthritis of both knees 10/18/2017  . Obesity (BMI 30.0-34.9) 10/18/2017     Current Outpatient Medications on File Prior to Visit  Medication Sig Dispense Refill  . atorvastatin (LIPITOR) 20 MG tablet Take 1 tablet (20 mg total) by mouth daily. 30 tablet 6  . ferrous sulfate 325 (65 FE) MG tablet Take 1 tablet (325 mg total) by mouth daily with breakfast. 90 tablet 1  . furosemide (LASIX) 20 MG tablet Take 1 tablet (20 mg total) by mouth daily. 90 tablet 3  . meloxicam (MOBIC) 15 MG tablet Take 1 tablet (15 mg total) by mouth daily. 30 tablet 6  . metoprolol tartrate (LOPRESSOR) 50 MG  tablet Take 1 tablet (50 mg total) by mouth 1 day or 1 dose. 90 tablet 3   No current facility-administered medications on file prior to visit.     No Known Allergies  Social History   Socioeconomic History  . Marital status: Married    Spouse name: Not on file  . Number of children: 6  . Years of education: 5 th grade  . Highest education level: Not on file  Occupational History  . Occupation: unempolyed  Social Needs  . Financial resource strain: Not on file  . Food insecurity:    Worry: Not on file    Inability: Not on file  . Transportation needs:    Medical: Not on file    Non-medical: Not on file  Tobacco Use  . Smoking status: Never Smoker  . Smokeless tobacco: Never Used  Substance and Sexual Activity  . Alcohol use: No    Frequency: Never  . Drug use: No  . Sexual activity: Not on file  Lifestyle  . Physical activity:    Days per week: Not on file    Minutes per session: Not on file  . Stress: Not on file  Relationships  . Social connections:    Talks on phone: Not on file    Gets together: Not on file    Attends religious service: Not on file    Active  member of club or organization: Not on file    Attends meetings of clubs or organizations: Not on file    Relationship status: Not on file  . Intimate partner violence:    Fear of current or ex partner: Not on file    Emotionally abused: Not on file    Physically abused: Not on file    Forced sexual activity: Not on file  Other Topics Concern  . Not on file  Social History Narrative  . Not on file    Family History  Problem Relation Age of Onset  . Hypertension Daughter   . Breast cancer Neg Hx     Past Surgical History:  Procedure Laterality Date  . CATARACT EXTRACTION, BILATERAL Bilateral 2017   with lens implant  . EYE SURGERY Bilateral 2015   cataract extractionwith lens implant    ROS: Review of Systems Negative except as stated above  PHYSICAL EXAM: BP (!) 150/82   Pulse 64    Temp 98.2 F (36.8 C) (Oral)   Resp 16   Wt 173 lb 9.6 oz (78.7 kg)   SpO2 98%   BMI 36.28 kg/m   Physical Exam BP 150/82 General appearance - alert, well appearing, older female and in no distress Mental status - normal mood, behavior, speech, dress, motor activity, and thought processes Neck - supple, no significant adenopathy Chest - clear to auscultation, no wheezes, rales or rhonchi, symmetric air entry Heart - normal rate, regular rhythm, normal S1, S2, no murmurs, rubs, clicks or gallops Extremities - peripheral pulses normal, no pedal edema, no clubbing or cyanosis   CMP Latest Ref Rng & Units 10/18/2018 10/18/2017  Glucose 65 - 99 mg/dL 94 95  BUN 8 - 27 mg/dL 20 15  Creatinine 5.360.57 - 1.00 mg/dL 6.440.83 0.340.68  Sodium 742134 - 144 mmol/L 138 140  Potassium 3.5 - 5.2 mmol/L 4.2 4.1  Chloride 96 - 106 mmol/L 99 101  CO2 20 - 29 mmol/L 20 23  Calcium 8.7 - 10.3 mg/dL 9.6 9.9  Total Protein 6.0 - 8.5 g/dL 7.1 7.8  Total Bilirubin 0.0 - 1.2 mg/dL <5.9<0.2 0.2  Alkaline Phos 39 - 117 IU/L 91 92  AST 0 - 40 IU/L 16 16  ALT 0 - 32 IU/L 13 11   Lipid Panel     Component Value Date/Time   CHOL 205 (H) 10/18/2018 1614   TRIG 152 (H) 10/18/2018 1614   HDL 62 10/18/2018 1614   CHOLHDL 3.3 10/18/2018 1614   LDLCALC 113 (H) 10/18/2018 1614    CBC    Component Value Date/Time   WBC 9.6 10/18/2018 1614   RBC 3.71 (L) 10/18/2018 1614   HGB 11.5 10/18/2018 1614   HCT 33.7 (L) 10/18/2018 1614   PLT 183 10/18/2018 1614   MCV 91 10/18/2018 1614   MCH 31.0 10/18/2018 1614   MCHC 34.1 10/18/2018 1614   RDW 12.2 10/18/2018 1614   Iron/TIBC/Ferritin/ %Sat    Component Value Date/Time   IRON 37 10/18/2018 1614   TIBC 321 10/18/2018 1614   FERRITIN 39 10/18/2018 1614   IRONPCTSAT 12 (L) 10/18/2018 1614    ASSESSMENT AND PLAN: 1. Essential hypertension Not at goal.  Continue metoprolol.  Add low-dose of amlodipine.  Prescription given for blood pressure monitoring device.  Advised to  check blood pressure once to twice a week with goal being 130/80 or lower - amLODipine (NORVASC) 5 MG tablet; Take 0.5 tablets (2.5 mg total) by mouth daily.  Dispense:  30 tablet; Refill: 3 - Blood Pressure Monitor DEVI; Use as directed to check home blood pressure 2-3 times a week  Dispense: 1 Device; Refill: 0  2. Hyperlipidemia, unspecified hyperlipidemia type Continue atorvastatin.  I told her that she should be on the 20 mg tablets.  She will take 2 of the 10 mg tablets until she completes that bottle and then start taking the 20 mg tablet once a day  3. Iron deficiency anemia, unspecified iron deficiency anemia type GI work-up completed.  4. Primary osteoarthritis of both knees Doing okay on meloxicam.  Encourage to work on weight loss through healthy eating and continuing to remain active..  5. Obesity (BMI 30-39.9) See #4 above    Patient was given the opportunity to ask questions.  Patient verbalized understanding of the plan and was able to repeat key elements of the plan.   No orders of the defined types were placed in this encounter.    Requested Prescriptions   Signed Prescriptions Disp Refills  . amLODipine (NORVASC) 5 MG tablet 30 tablet 3    Sig: Take 0.5 tablets (2.5 mg total) by mouth daily.  . Blood Pressure Monitor DEVI 1 Device 0    Sig: Use as directed to check home blood pressure 2-3 times a week    Return in about 3 months (around 05/19/2019).  Jonah Blue, MD, FACP

## 2019-04-11 DIAGNOSIS — R6889 Other general symptoms and signs: Secondary | ICD-10-CM | POA: Diagnosis not present

## 2019-04-12 ENCOUNTER — Ambulatory Visit: Payer: Medicaid Other | Attending: Family Medicine | Admitting: Physician Assistant

## 2019-04-12 ENCOUNTER — Other Ambulatory Visit: Payer: Self-pay

## 2019-04-12 DIAGNOSIS — R52 Pain, unspecified: Secondary | ICD-10-CM

## 2019-04-12 DIAGNOSIS — R51 Headache: Secondary | ICD-10-CM | POA: Diagnosis not present

## 2019-04-12 DIAGNOSIS — R059 Cough, unspecified: Secondary | ICD-10-CM

## 2019-04-12 DIAGNOSIS — R05 Cough: Secondary | ICD-10-CM

## 2019-04-12 DIAGNOSIS — M791 Myalgia, unspecified site: Secondary | ICD-10-CM

## 2019-04-12 NOTE — Progress Notes (Signed)
Patient verified DOB Patient has eaten today. Patient has taken medication. Patient has body aches. Patient has no fever. Patient denies N/V or diarrhea. Patient denies SOB. Patient has had symptoms for 5 days. BP: 124/76

## 2019-04-12 NOTE — Progress Notes (Signed)
Patient ID: Katie Burns, female   DOB: [birth year], 73 y.o.   MRN: [id number] Virtual Visit via Telephone Note  I connected with Katie Burns on 04/12/19 at 11:10 AM EDT by telephone and verified that I am speaking with the correct person using two identifiers.   I discussed the limitations, risks, security and privacy concerns of performing an evaluation and management service by telephone and the availability of in person appointments. I also discussed with the patient that there may be a patient responsible charge related to this service. The patient expressed understanding and agreed to proceed.  Patient location:  home My Location:  [place] office Persons on the call:  Daughter(Paraam), myself, and the patient  History of Present Illness: 5 day h/o bodyaches, dry cough, HA. Temp 97.0. Her husband has the same symptoms.  No N/V/D.  Appetite is good. No respiratory distress. Husband has the same symptoms that started 2 days after her symptoms.  No known exposure to Covid or recent travel.  5 people live in househould  Tylenol and nyquil help some   Observations/Objective: A&O X3   Assessment and Plan: 1. Body aches Fluids, rest, respiratory care - Novel Coronavirus, NAA ([facility])  2. Cough Fluids, rest, respiratory care    Follow Up Instructions: Prn with PCP   I discussed the assessment and treatment plan with the patient. The patient was provided an opportunity to ask questions and all were answered. The patient agreed with the plan and demonstrated an understanding of the instructions.   The patient was advised to call back or seek an in-person evaluation if the symptoms worsen or if the condition fails to improve as anticipated.  I provided 14 minutes of non-face-to-face time during this encounter.   Freeman Caldron, PA-C

## 2019-04-14 ENCOUNTER — Other Ambulatory Visit: Payer: Self-pay

## 2019-04-14 DIAGNOSIS — Z20822 Contact with and (suspected) exposure to covid-19: Secondary | ICD-10-CM

## 2019-04-17 ENCOUNTER — Telehealth: Payer: Self-pay | Admitting: Internal Medicine

## 2019-04-17 NOTE — Telephone Encounter (Signed)
Pt calling for covid test results. Return call to [phone].

## 2019-04-17 NOTE — Telephone Encounter (Signed)
We have not received COVID test yet once we receive it will give pt a call  

## 2019-04-18 LAB — NOVEL CORONAVIRUS, NAA: SARS-CoV-2, NAA: DETECTED — AB

## 2019-04-21 NOTE — Telephone Encounter (Signed)
-----   Message from Jackelyn Knife, [place] sent at 04/20/2019 10:01 AM EDT ----- Can you please move up this patient's appointment? She will need tele or virtual visit with PCP next week.

## 2019-04-21 NOTE — Telephone Encounter (Signed)
PCP was double booked morning and afternoon till 8/10, scheduled appt for then.

## 2019-04-27 ENCOUNTER — Other Ambulatory Visit: Payer: Self-pay | Admitting: Internal Medicine

## 2019-05-08 ENCOUNTER — Other Ambulatory Visit: Payer: Self-pay

## 2019-05-08 ENCOUNTER — Ambulatory Visit: Payer: Medicaid Other | Attending: Internal Medicine | Admitting: Internal Medicine

## 2019-05-19 ENCOUNTER — Ambulatory Visit: Payer: Medicaid Other | Admitting: Internal Medicine

## 2019-08-09 ENCOUNTER — Other Ambulatory Visit: Payer: Self-pay | Admitting: Internal Medicine

## 2019-08-09 DIAGNOSIS — Z1231 Encounter for screening mammogram for malignant neoplasm of breast: Secondary | ICD-10-CM

## 2019-08-15 ENCOUNTER — Other Ambulatory Visit: Payer: Self-pay | Admitting: Internal Medicine

## 2019-08-15 DIAGNOSIS — I10 Essential (primary) hypertension: Secondary | ICD-10-CM

## 2019-08-22 ENCOUNTER — Other Ambulatory Visit: Payer: Self-pay | Admitting: Internal Medicine

## 2019-08-22 DIAGNOSIS — I10 Essential (primary) hypertension: Secondary | ICD-10-CM

## 2019-09-24 ENCOUNTER — Other Ambulatory Visit: Payer: Self-pay | Admitting: Internal Medicine

## 2019-09-24 DIAGNOSIS — I10 Essential (primary) hypertension: Secondary | ICD-10-CM

## 2019-10-03 ENCOUNTER — Ambulatory Visit: Payer: Medicaid Other

## 2019-10-18 ENCOUNTER — Other Ambulatory Visit: Payer: Self-pay | Admitting: Internal Medicine

## 2019-10-18 DIAGNOSIS — I10 Essential (primary) hypertension: Secondary | ICD-10-CM

## 2019-10-18 DIAGNOSIS — E785 Hyperlipidemia, unspecified: Secondary | ICD-10-CM

## 2019-10-23 ENCOUNTER — Telehealth: Payer: Self-pay | Admitting: Internal Medicine

## 2019-10-23 NOTE — Telephone Encounter (Signed)
1) Medication(s) Requested (by name): atorvastatin (LIPITOR) 20 MG tablet [[id number]]  metoprolol tartrate (LOPRESSOR) 50 MG tablet [[id number]]  furosemide (LASIX) 20 MG tablet [[id number]]   2) Pharmacy of Choice: [id number] - [place], [place] - [address] AT [facility] OF [address]    Approved medications will be sent to pharmacy, we will reach out to you if there is an issue.  Requests made after 3pm may not be addressed until following business day!

## 2019-10-24 NOTE — Telephone Encounter (Signed)
Patient must have an appointment scheduled for refills. I will route this back to the front.

## 2019-10-24 NOTE — Telephone Encounter (Signed)
Please fill if appropriate.  

## 2019-10-26 ENCOUNTER — Telehealth: Payer: Self-pay | Admitting: Internal Medicine

## 2019-10-26 DIAGNOSIS — I10 Essential (primary) hypertension: Secondary | ICD-10-CM

## 2019-10-26 DIAGNOSIS — E785 Hyperlipidemia, unspecified: Secondary | ICD-10-CM

## 2019-10-26 MED ORDER — ATORVASTATIN CALCIUM 20 MG PO TABS: 20.0000 mg | ORAL_TABLET | Freq: Every day | ORAL | 0 refills | Status: DC

## 2019-10-26 MED ORDER — METOPROLOL TARTRATE 50 MG PO TABS: ORAL_TABLET | ORAL | 0 refills | Status: DC

## 2019-10-26 MED ORDER — FUROSEMIDE 20 MG PO TABS: 20.0000 mg | ORAL_TABLET | Freq: Every day | ORAL | 0 refills | Status: DC

## 2019-10-26 NOTE — Telephone Encounter (Signed)
Rx sent 

## 2019-10-26 NOTE — Telephone Encounter (Signed)
1) Medication(s) Requested (by name): -atorvastatin (LIPITOR) 20 MG tablet  -metoprolol tartrate (LOPRESSOR) 50 MG tablet -furosemide (LASIX) 20 MG tablet   2) Pharmacy of Choice: [id number] [place], [place] - [address]   3) Special Requests: -Pt would like at least enough until her appt 11/10/2019

## 2019-11-07 ENCOUNTER — Ambulatory Visit
Admission: RE | Admit: 2019-11-07 | Discharge: 2019-11-07 | Disposition: A | Discharge status: Home / Self Care | Payer: Medicaid Other | Source: Ambulatory Visit | Attending: Internal Medicine | Admitting: Internal Medicine

## 2019-11-07 ENCOUNTER — Other Ambulatory Visit: Payer: Self-pay

## 2019-11-07 DIAGNOSIS — Z1231 Encounter for screening mammogram for malignant neoplasm of breast: Secondary | ICD-10-CM

## 2019-11-10 ENCOUNTER — Other Ambulatory Visit: Payer: Self-pay

## 2019-11-10 ENCOUNTER — Ambulatory Visit: Payer: Medicaid Other | Attending: Internal Medicine | Admitting: Internal Medicine

## 2019-11-10 DIAGNOSIS — M170 Bilateral primary osteoarthritis of knee: Secondary | ICD-10-CM | POA: Diagnosis not present

## 2019-11-10 DIAGNOSIS — E785 Hyperlipidemia, unspecified: Secondary | ICD-10-CM | POA: Diagnosis not present

## 2019-11-10 DIAGNOSIS — I10 Essential (primary) hypertension: Secondary | ICD-10-CM

## 2019-11-10 DIAGNOSIS — R739 Hyperglycemia, unspecified: Secondary | ICD-10-CM

## 2019-11-10 DIAGNOSIS — D509 Iron deficiency anemia, unspecified: Secondary | ICD-10-CM

## 2019-11-10 DIAGNOSIS — Z23 Encounter for immunization: Secondary | ICD-10-CM

## 2019-11-10 MED ORDER — FUROSEMIDE 20 MG PO TABS: 20.0000 mg | ORAL_TABLET | Freq: Every day | ORAL | 6 refills | Status: DC

## 2019-11-10 MED ORDER — MELOXICAM 15 MG PO TABS: 15.0000 mg | ORAL_TABLET | Freq: Every day | ORAL | 6 refills | Status: DC

## 2019-11-10 MED ORDER — METOPROLOL TARTRATE 50 MG PO TABS: ORAL_TABLET | ORAL | 6 refills | Status: DC

## 2019-11-10 MED ORDER — AMLODIPINE BESYLATE 5 MG PO TABS: 2.5000 mg | ORAL_TABLET | Freq: Every day | ORAL | 3 refills | Status: DC

## 2019-11-10 MED ORDER — ATORVASTATIN CALCIUM 20 MG PO TABS: 20.0000 mg | ORAL_TABLET | Freq: Every day | ORAL | 6 refills | Status: DC

## 2019-11-10 NOTE — Progress Notes (Addendum)
Virtual Visit via Telephone Note Due to current restrictions/limitations of in-office visits due to the COVID-19 pandemic, this scheduled clinical appointment was converted to a telehealth visit  I connected with Katie Burns on 11/10/19 at 4:27 p.m by telephone and verified that I am speaking with the correct person using two identifiers. I am in my office.  The patient is at home.  Only the patient, her daughter, Paramjit, and myself participated in this encounter.  I discussed the limitations, risks, security and privacy concerns of performing an evaluation and management service by telephone and the availability of in person appointments. I also discussed with the patient that there may be a patient responsible charge related to this service. The patient expressed understanding and agreed to proceed.   History of Present Illness: Pt with hx of HTN, HL, LBP, obesity, IDA and OA knees.  I last saw patient 01/2019.  Purpose of today's visit is chronic disease management.  Patient's daughter interprets.  HYPERTENSION Currently taking: see medication list Med Adherence: []     [x]  No taking Norvasc.  Taking Metoprolol Medication side effects: []  Yes    [x]  No Adherence with salt restriction: [x]  Yes    []  No Home Monitoring?: [x]  Yes    []  No Monitoring Frequency: [x]  Yes twice a wk    []  No Home BP results range: 134/86, 130-140/80s SOB? []  Yes    [x]  No Chest Pain?: []  Yes    [x]  No Leg swelling?: []  Yes    [x]  No Headaches?: []  Yes    [x]  No Dizziness? []  Yes    [x]  No Comments:   HL:  compliant with taking atorvastatin.  No muscle aches or cramps.  OA Knees:  Needing RF on Meloxicam.  States that she takes it only when needed and not every day. No falls   History of iron deficiency anemia: She tells me that she is still taking the iron supplement.  HM;  Due for flu and Pneumovax.  She is willing to come and has these vaccines. Outpatient Encounter Medications as of 11/10/2019   Medication Sig  . amLODipine (NORVASC) 5 MG tablet Take 0.5 tablets (2.5 mg total) by mouth daily.  atorvastatin (LIPITOR) 20 MG tablet Take 1 tablet (20 mg total) by mouth daily.  . Blood Pressure Monitor DEVI Use as directed to check home blood pressure 2-3 times a week  . FEROSUL 325 (65 Fe) MG tablet TAKE 1 TABLET(325 MG) BY MOUTH DAILY WITH BREAKFAST  . furosemide (LASIX) 20 MG tablet Take 1 tablet (20 mg total) by mouth daily.  . meloxicam (MOBIC) 15 MG tablet Take 1 tablet (15 mg total) by mouth daily.  . metoprolol tartrate (LOPRESSOR) 50 MG tablet TAKE 1 TABLET(50 MG) BY MOUTH DAILY   No facility-administered encounter medications on file as of 11/10/2019.    Observations/Objective:   Chemistry      Component Value Date/Time   NA 138 10/18/2018 1614   K 4.2 10/18/2018 1614   CL 99 10/18/2018 1614   CO2 20 10/18/2018 1614   BUN 20 10/18/2018 1614   CREATININE 0.83 10/18/2018 1614      Component Value Date/Time   CALCIUM 9.6 10/18/2018 1614   ALKPHOS 91 10/18/2018 1614   AST 16 10/18/2018 1614   ALT 13 10/18/2018 1614   BILITOT <0.2 10/18/2018 1614     Lab Results  Component Value Date   WBC 9.6 10/18/2018   HGB 11.5 10/18/2018   HCT 33.7 (L) 10/18/2018  MCV 91 10/18/2018   PLT 183 10/18/2018     Assessment and Plan: 1. Essential hypertension Not at goal.  Continue metoprolol.  Add Norvasc as was discussed on her last visit with me.  Continue low-salt diet - CBC; Future - Comprehensive metabolic panel; Future - metoprolol tartrate (LOPRESSOR) 50 MG tablet; TAKE 1 TABLET(50 MG) BY MOUTH DAILY  Dispense: 30 tablet; Refill: 6 - furosemide (LASIX) 20 MG tablet; Take 1 tablet (20 mg total) by mouth daily.  Dispense: 30 tablet; Refill: 6 - amLODipine (NORVASC) 5 MG tablet; Take 0.5 tablets (2.5 mg total) by mouth daily.  Dispense: 30 tablet; Refill: 3  2. Hyperlipidemia, unspecified hyperlipidemia type - Lipid panel; Future - atorvastatin (LIPITOR) 20 MG  tablet; Take 1 tablet (20 mg total) by mouth daily.  Dispense: 30 tablet; Refill: 6  3. Iron deficiency anemia, unspecified iron deficiency anemia type Check CBC to see whether she still needs to be on iron supplement  4. Primary osteoarthritis of both knees  - meloxicam (MOBIC) 15 MG tablet; Take 1 tablet (15 mg total) by mouth daily.  Dispense: 30 tablet; Refill: 6  5. Need for influenza vaccination Patient will come to our facility to have vaccine next week  6. Need for vaccination against Streptococcus pneumoniae Patient will come to receive vaccine next week   Follow Up Instructions: 3 to 4 months   I discussed the assessment and treatment plan with the patient. The patient was provided an opportunity to ask questions and all were answered. The patient agreed with the plan and demonstrated an understanding of the instructions.   The patient was advised to call back or seek an in-person evaluation if the symptoms worsen or if the condition fails to improve as anticipated.  I provided 11 minutes of non-face-to-face time during this encounter.  Addendum:  A1C added due to BS of 137 on BMP.  Karle Plumber, MD

## 2019-11-14 ENCOUNTER — Ambulatory Visit: Payer: Medicaid Other | Attending: Internal Medicine

## 2019-11-14 ENCOUNTER — Other Ambulatory Visit: Payer: Self-pay

## 2019-11-14 DIAGNOSIS — I10 Essential (primary) hypertension: Secondary | ICD-10-CM

## 2019-11-14 DIAGNOSIS — E785 Hyperlipidemia, unspecified: Secondary | ICD-10-CM

## 2019-11-14 DIAGNOSIS — Z23 Encounter for immunization: Secondary | ICD-10-CM | POA: Diagnosis not present

## 2019-11-14 DIAGNOSIS — R739 Hyperglycemia, unspecified: Secondary | ICD-10-CM

## 2019-11-14 NOTE — Progress Notes (Signed)
Pt is here with her son as instructed by PCP for Pneumococcal 23 and Flu shot.  Denies any eggs allergies or previous vaccination allergy.Denies being sick. No distress noted. No evidence of acute illnesses noted. T 98.3 F recorded   Dicussed with patient possible SE or AR prior to injection administration. VIS and instructions given/ After care shots instructions reviewed/Verbalized understanding Injections Well tolerated/ No reaction noted post administration.  Discussed with patient that soreness around the injection area and pain is expected. Instructed to go to UC/ ED if severe allergic reaction occurs. Verbalized understanding

## 2019-11-15 ENCOUNTER — Telehealth: Payer: Self-pay

## 2019-11-15 LAB — COMPREHENSIVE METABOLIC PANEL
ALT: 14 IU/L (ref 0–32)
AST: 24 IU/L (ref 0–40)
Albumin/Globulin Ratio: 1.4 (ref 1.2–2.2)
Albumin: 4.2 g/dL (ref 3.7–4.7)
Alkaline Phosphatase: 98 IU/L (ref 39–117)
BUN/Creatinine Ratio: 15 (ref 12–28)
BUN: 13 mg/dL (ref 8–27)
Bilirubin Total: <0.2 mg/dL (ref 0.0–1.2)
CO2: 22 mmol/L (ref 20–29)
Calcium: 9.9 mg/dL (ref 8.7–10.3)
Chloride: 103 mmol/L (ref 96–106)
Creatinine, Ser: 0.89 mg/dL (ref 0.57–1.00)
GFR calc Af Amer: 75 mL/min/{1.73_m2} (ref >59)
GFR calc non Af Amer: 65 mL/min/{1.73_m2} (ref >59)
Globulin, Total: 3 g/dL (ref 1.5–4.5)
Glucose: 137 mg/dL — ABNORMAL HIGH (ref 65–99)
Potassium: 4.4 mmol/L (ref 3.5–5.2)
Sodium: 141 mmol/L (ref 134–144)
Total Protein: 7.2 g/dL (ref 6.0–8.5)

## 2019-11-15 LAB — CBC
Hematocrit: 35.8 % (ref 34.0–46.6)
Hemoglobin: 12.1 g/dL (ref 11.1–15.9)
MCH: 30.3 pg (ref 26.6–33.0)
MCHC: 33.8 g/dL (ref 31.5–35.7)
MCV: 90 fL (ref 79–97)
Platelets: 210 10*3/uL (ref 150–450)
RBC: 4 x10E6/uL (ref 3.77–5.28)
RDW: 12 % (ref 11.7–15.4)
WBC: 10.2 10*3/uL (ref 3.4–10.8)

## 2019-11-15 LAB — LIPID PANEL
Chol/HDL Ratio: 3 ratio (ref 0.0–4.4)
Cholesterol, Total: 198 mg/dL (ref 100–199)
HDL: 65 mg/dL (ref >39)
LDL Chol Calc (NIH): 106 mg/dL — ABNORMAL HIGH (ref 0–99)
Triglycerides: 155 mg/dL — ABNORMAL HIGH (ref 0–149)
VLDL Cholesterol Cal: 27 mg/dL (ref 5–40)

## 2019-11-15 NOTE — Telephone Encounter (Signed)
Contacted pt daughter Paramjit to go over pt lab results pt daughter is aware and doesn't have any questions or concerns

## 2019-11-15 NOTE — Addendum Note (Signed)
Addended by: Jonah Blue B on: 11/15/2019 11:09 AM   Modules accepted: Orders

## 2019-11-15 NOTE — Addendum Note (Signed)
Addended byMemory Dance on: 11/15/2019 11:43 AM   Modules accepted: Orders

## 2019-11-15 NOTE — Telephone Encounter (Signed)
Contacted pt daughter Paramjit to go over pt MM results pt daughter is aware and doesn't have any questions or concerns

## 2019-11-15 NOTE — Progress Notes (Signed)
Let patient know that her LDL cholesterol is 106 with goal being less than 100.  Make sure that she takes the atorvastatin every day as prescribed.  Her kidney and liver function tests are normal.  Blood sugar is mildly elevated.  We will add an additional lab test to screen for diabetes.  I will let her know when I get the results.  Her blood count is normal meaning no anemia.  She can start taking the iron tablet 4 times a week instead of daily.

## 2019-11-18 LAB — HEMOGLOBIN A1C
Est. average glucose Bld gHb Est-mCnc: 117 mg/dL
Hgb A1c MFr Bld: 5.7 % — ABNORMAL HIGH (ref 4.8–5.6)

## 2019-11-18 LAB — SPECIMEN STATUS REPORT

## 2019-11-19 ENCOUNTER — Encounter: Payer: Self-pay | Admitting: Internal Medicine

## 2019-11-19 DIAGNOSIS — R7303 Prediabetes: Secondary | ICD-10-CM | POA: Insufficient documentation

## 2019-11-20 ENCOUNTER — Telehealth: Payer: Self-pay | Admitting: *Deleted

## 2019-11-20 NOTE — Telephone Encounter (Signed)
Medical Assistant used [employer] to contact patient.  Interpreter Name: Interpreter #:  Patient is aware of prediabetes being present and needing to eliminate breads, rice and pasta and sugary drinks.

## 2019-11-20 NOTE — Telephone Encounter (Signed)
-----   Message from Marcine Matar, MD sent at 11/19/2019  8:53 AM EST ----- Let pt or her daughter know that she has prediabetes.  This means she is at risk to develop diabetes if steps are not taken to prevent it.  This includes changing eating habits and trying to move more.  Cut out sugary drinks like sodas, juices, sweet tea.  Drink more water instead.  Cut back on white stuff like bread, rice, potatoes, pasta.  Incorporate fresh fruits and veggies into the diet.

## 2020-02-12 ENCOUNTER — Encounter: Payer: Self-pay | Admitting: Internal Medicine

## 2020-02-12 ENCOUNTER — Ambulatory Visit: Payer: Medicaid Other | Attending: Internal Medicine | Admitting: Internal Medicine

## 2020-02-12 ENCOUNTER — Other Ambulatory Visit: Payer: Self-pay

## 2020-02-12 VITALS — BP 138/60 | HR 61 | Temp 97.2°F | Resp 16 | Wt 169.8 lb

## 2020-02-12 DIAGNOSIS — Z791 Long term (current) use of non-steroidal anti-inflammatories (NSAID): Secondary | ICD-10-CM | POA: Diagnosis not present

## 2020-02-12 DIAGNOSIS — Z6835 Body mass index (BMI) 35.0-35.9, adult: Secondary | ICD-10-CM

## 2020-02-12 DIAGNOSIS — M25539 Pain in unspecified wrist: Secondary | ICD-10-CM | POA: Insufficient documentation

## 2020-02-12 DIAGNOSIS — Z79899 Other long term (current) drug therapy: Secondary | ICD-10-CM | POA: Diagnosis not present

## 2020-02-12 DIAGNOSIS — D509 Iron deficiency anemia, unspecified: Secondary | ICD-10-CM

## 2020-02-12 DIAGNOSIS — M79642 Pain in left hand: Secondary | ICD-10-CM

## 2020-02-12 DIAGNOSIS — M170 Bilateral primary osteoarthritis of knee: Secondary | ICD-10-CM | POA: Diagnosis not present

## 2020-02-12 DIAGNOSIS — M79641 Pain in right hand: Secondary | ICD-10-CM | POA: Diagnosis not present

## 2020-02-12 DIAGNOSIS — I10 Essential (primary) hypertension: Secondary | ICD-10-CM | POA: Diagnosis not present

## 2020-02-12 DIAGNOSIS — E669 Obesity, unspecified: Secondary | ICD-10-CM | POA: Insufficient documentation

## 2020-02-12 DIAGNOSIS — R7303 Prediabetes: Secondary | ICD-10-CM

## 2020-02-12 DIAGNOSIS — E785 Hyperlipidemia, unspecified: Secondary | ICD-10-CM | POA: Diagnosis not present

## 2020-02-12 DIAGNOSIS — Z780 Asymptomatic menopausal state: Secondary | ICD-10-CM | POA: Diagnosis not present

## 2020-02-12 DIAGNOSIS — E6609 Other obesity due to excess calories: Secondary | ICD-10-CM | POA: Diagnosis not present

## 2020-02-12 MED ORDER — AMLODIPINE BESYLATE 5 MG PO TABS: 5.0000 mg | ORAL_TABLET | Freq: Every day | ORAL | 3 refills | Status: DC

## 2020-02-12 NOTE — Progress Notes (Signed)
Patient ID: Katie Burns, adult    DOB: [birth year]  MRN: [id number]  CC: Hypertension   Subjective: Katie Burns is a 74 y.o. adult who presents for chronic ds management.  Son-in-law, Katie Burns,  is with her and interpretes Her concerns today include:  Pt with hx of preDM, HTN, HL, LBP, obesity, IDA and OA knees.  I last saw patient 01/2019.  Purpose of today's visit is chronic disease management.  Pt c/o pain in thumb BL x 1 mth.  No swelling No initiating factors Takes Mobic every 3 days for knee OA.  It also helps for the pain that she has been having in the thumb.  HYPERTENSION Currently taking: see medication list Med Adherence: [x]  Yes    []  No Medication side effects: []  Yes    [x]  No Adherence with salt restriction: [x]  Yes    []  No Home Monitoring?: []  Yes    [x]  No - but does have a device. Monitoring Frequency: []  Yes    []  No Home BP results range: []  Yes    []  No SOB? []  Yes    [x]  No Chest Pain?: []  Yes    [x]  No Leg swelling?: []  Yes    [x]  No Headaches?: []  Yes    [x]  No Dizziness? []  Yes    [x]  No Comments:   HL:  Tolerating Lipitor  Obesity:  Positive for preDM on lab test done after last visit.  She has decrease sugar intake Walks up and down flight of stairs at home several times a day  Anemia:  Corrected on last test.  Advise to take iron supplement 4 x a wk.  She has been taking 3 x/wk  HM: completed COVID 19 vaccine 3/15 and 01/02/2020 Patient Active Problem List   Diagnosis Date Noted  . Prediabetes 11/19/2019  . Essential hypertension 10/18/2017  . Hyperlipidemia 10/18/2017  . Primary osteoarthritis of both knees 10/18/2017  . Obesity (BMI 30.0-34.9) 10/18/2017     Current Outpatient Medications on File Prior to Visit  Medication Sig Dispense Refill  . atorvastatin (LIPITOR) 20 MG tablet Take 1 tablet (20 mg total) by mouth daily. 30 tablet 6  . Blood Pressure Monitor DEVI Use as directed to check home blood pressure 2-3 times a week 1 Device  0  . FEROSUL 325 (65 Fe) MG tablet TAKE 1 TABLET(325 MG) BY MOUTH DAILY WITH BREAKFAST 90 tablet 0  . furosemide (LASIX) 20 MG tablet Take 1 tablet (20 mg total) by mouth daily. 30 tablet 6  . meloxicam (MOBIC) 15 MG tablet Take 1 tablet (15 mg total) by mouth daily. 30 tablet 6  . metoprolol tartrate (LOPRESSOR) 50 MG tablet TAKE 1 TABLET(50 MG) BY MOUTH DAILY 30 tablet 6   No current facility-administered medications on file prior to visit.    No Known Allergies  Social History   Socioeconomic History  . Marital status: Married    Spouse name: Not on file  . Number of children: 6  . Years of education: 5 th grade  . Highest education level: Not on file  Occupational History  . Occupation: unempolyed  Tobacco Use  . Smoking status: Never Smoker  . Smokeless tobacco: Never Used  Substance and Sexual Activity  . Alcohol use: No  . Drug use: No  . Sexual activity: Not on file  Other Topics Concern  . Not on file  Social History Narrative  . Not on file   Social Determinants of Health  Financial Resource Strain:   . Difficulty of Paying Living Expenses:   Food Insecurity:   . Worried About Programme researcher, broadcasting/film/video in the Last Year:   . Barista in the Last Year:   Transportation Needs:   . Freight forwarder (Medical):   Marland Kitchen Lack of Transportation (Non-Medical):   Physical Activity:   . Days of Exercise per Week:   . Minutes of Exercise per Session:   Stress:   . Feeling of Stress :   Social Connections:   . Frequency of Communication with Friends and Family:   . Frequency of Social Gatherings with Friends and Family:   . Attends Religious Services:   . Active Member of Clubs or Organizations:   . Attends Banker Meetings:   Marland Kitchen Marital Status:   Intimate Partner Violence:   . Fear of Current or Ex-Partner:   . Emotionally Abused:   Marland Kitchen Physically Abused:   . Sexually Abused:     Family History  Problem Relation Age of Onset  . Hypertension  Daughter   . Breast cancer Neg Hx     Past Surgical History:  Procedure Laterality Date  . CATARACT EXTRACTION, BILATERAL Bilateral 2017   with lens implant  . EYE SURGERY Bilateral 2015   cataract extractionwith lens implant    ROS: Review of Systems Negative except as stated above  PHYSICAL EXAM: BP 138/60   Pulse 61   Temp (!) 97.2 F (36.2 C)   Resp 16   Wt 169 lb 12.8 oz (77 kg)   SpO2 99%   BMI 35.49 kg/m   Wt Readings from Last 3 Encounters:  02/12/20 169 lb 12.8 oz (77 kg)  02/16/19 173 lb 9.6 oz (78.7 kg)  10/18/18 174 lb (78.9 kg)    Physical Exam  General appearance - alert, well appearing, and in no distress Mental status - normal mood, behavior, speech, dress, motor activity, and thought processes Eyes -slightly pale conjunctiva.   Neck - supple, no significant adenopathy Chest - clear to auscultation, no wheezes, rales or rhonchi, symmetric air entry Heart - normal rate, regular rhythm, normal S1, S2, no murmurs, rubs, clicks or gallops Extremities - peripheral pulses normal, no pedal edema, no clubbing or cyanosis mild varicose veins in the lower legs. MSK: Hands -no signs of active inflammation in the joints.  No joint deformity.  No discomfort with passive range of motion of the joints in both thumb Knees: Mild joint enlargement.  No discomfort with passive range of motion.   CMP Latest Ref Rng & Units 11/14/2019 10/18/2018 10/18/2017  Glucose 65 - 99 mg/dL 509(T) 94 95  BUN 8 - 27 mg/dL 13 20 15   Creatinine 0.57 - 1.00 mg/dL 2.67 1.24  Sodium 134 - 144 mmol/L 141 138 140  Potassium 3.5 - 5.2 mmol/L 4.4 4.2 4.1  Chloride 96 - 106 mmol/L 103 99 101  CO2 20 - 29 mmol/L 22 20 23   Calcium 8.7 - 10.3 mg/dL 9.9 9.6 9.9  Total Protein 6.0 - 8.5 g/dL 7.2 7.1 7.8  Total Bilirubin 0.0 - 1.2 mg/dL 5.80 0.2  Alkaline Phos 39 - 117 IU/L 98 91 92  AST 0 - 40 IU/L 24 16 16   ALT 0 - 32 IU/L 14 13 11    Lipid Panel     Component Value Date/Time    CHOL 198 11/14/2019 1651   TRIG 155 (H) 11/14/2019 1651   HDL 65 11/14/2019 1651  CHOLHDL 3.0 11/14/2019 1651   LDLCALC 106 (H) 11/14/2019 1651    CBC    Component Value Date/Time   WBC 10.2 11/14/2019 1651   RBC 4.00 11/14/2019 1651   HGB 12.1 11/14/2019 1651   HCT 35.8 11/14/2019 1651   PLT 210 11/14/2019 1651   MCV 90 11/14/2019 1651   MCH 30.3 11/14/2019 1651   MCHC 33.8 11/14/2019 1651   RDW 12.0 11/14/2019 1651    ASSESSMENT AND PLAN: 1. Essential hypertension Not at goal.  Increase amlodipine to 5 mg daily.  Continue low-salt diet - amLODipine (NORVASC) 5 MG tablet; Take 1 tablet (5 mg total) by mouth daily.  Dispense: 90 tablet; Refill: 3  2. Prediabetes 3. Class 2 obesity due to excess calories without serious comorbidity with body mass index (BMI) of 35.0 to 35.9 in adult -discuss and encourage healthy eating habits.  Encouraged her to move as much as her knees will allow  4. Pain in both hands Likely early arthritis.  Continue meloxicam as needed  5. Hyperlipidemia, unspecified hyperlipidemia type Continue Lipitor.  6. Postmenopausal estrogen deficiency Discussed osteoporosis screening.  Patient agreed to having bone density study - DG Bone Density; Future  7. Iron deficiency anemia, unspecified iron deficiency anemia type Continue iron supplement 3 times a week   Patient was given the opportunity to ask questions.  Patient verbalized understanding of the plan and was able to repeat key elements of the plan.   Orders Placed This Encounter  Procedures  . DG Bone Density     Requested Prescriptions   Signed Prescriptions Disp Refills  . amLODipine (NORVASC) 5 MG tablet 90 tablet 3    Sig: Take 1 tablet (5 mg total) by mouth daily.    Return in about 4 months (around 06/14/2020).  Jonah Blue, MD, FACP

## 2020-02-12 NOTE — Progress Notes (Signed)
Pt states she is also having wrist pain

## 2020-02-12 NOTE — Patient Instructions (Addendum)
Increase amlodipine to 5 mg daily. Check your blood pressure at least once a week.  The goal is 130/80 or lower.   Obesity, Adult Obesity is having too much body fat. Being obese means that your weight is more than what is healthy for you. BMI is a number that explains how much body fat you have. If you have a BMI of 30 or more, you are obese. Obesity is often caused by eating or drinking more calories than your body uses. Changing your lifestyle can help you lose weight. Obesity can cause serious health problems, such as:  Stroke.  Coronary artery disease (CAD).  Type 2 diabetes.  Some types of cancer, including cancers of the colon, breast, uterus, and gallbladder.  Osteoarthritis.  High blood pressure (hypertension).  High cholesterol.  Sleep apnea.  Gallbladder stones.  Infertility problems. What are the causes?  Eating meals each day that are high in calories, sugar, and fat.  Being born with genes that may make you more likely to become obese.  Having a medical condition that causes obesity.  Taking certain medicines.  Sitting a lot (having a sedentary lifestyle).  Not getting enough sleep.  Drinking a lot of drinks that have sugar in them. What increases the risk?  Having a family history of obesity.  Being an [place] American woman.  Being a Hispanic man.  Living in an area with limited access to: ? [place] [facility], recreation centers, or sidewalks. ? Healthy food choices, such as grocery stores and farmers' markets. What are the signs or symptoms? The main sign is having too much body fat. How is this treated?  Treatment for this condition often includes changing your lifestyle. Treatment may include: ? Changing your diet. This may include making a healthy meal plan. ? Exercise. This may include activity that causes your heart to beat faster (aerobic exercise) and strength training. Work with your doctor to design a program that works for you. ? Medicine to  help you lose weight. This may be used if you are not able to lose 1 pound a week after 6 weeks of healthy eating and more exercise. ? Treating conditions that cause the obesity. ? Surgery. Options may include gastric banding and gastric bypass. This may be done if:  Other treatments have not helped to improve your condition.  You have a BMI of 40 or higher.  You have life-threatening health problems related to obesity. Follow these instructions at home: Eating and drinking   Follow advice from your doctor about what to eat and drink. Your doctor may tell you to: ? Limit fast food, sweets, and processed snack foods. ? Choose low-fat options. For example, choose low-fat milk instead of whole milk. ? Eat 5 or more servings of fruits or vegetables each day. ? Eat at home more often. This gives you more control over what you eat. ? Choose healthy foods when you eat out. ? Learn to read food labels. This will help you learn how much food is in 1 serving. ? Keep low-fat snacks available. ? Avoid drinks that have a lot of sugar in them. These include soda, fruit juice, iced tea with sugar, and flavored milk.  Drink enough water to keep your pee (urine) pale yellow.  Do not go on fad diets. Physical activity  Exercise often, as told by your doctor. Most adults should get up to 150 minutes of moderate-intensity exercise every week.Ask your doctor: ? What types of exercise are safe for you. ? How  often you should exercise.  Warm up and stretch before being active.  Do slow stretching after being active (cool down).  Rest between times of being active. Lifestyle  Work with your doctor and a food expert (dietitian) to set a weight-loss goal that is best for you.  Limit your screen time.  Find ways to reward yourself that do not involve food.  Do not drink alcohol if: ? Your doctor tells you not to drink. ? You are pregnant, may be pregnant, or are planning to become pregnant.  If  you drink alcohol: ? Limit how much you use to:  0-1 drink a day for women.  0-2 drinks a day for men. ? Be aware of how much alcohol is in your drink. In the [place] one drink equals one 12 oz bottle of beer (355 mL), one 5 oz glass of wine (148 mL), or one 1 oz glass of hard liquor (44 mL). General instructions  Keep a weight-loss journal. This can help you keep track of: ? The food that you eat. ? How much exercise you get.  Take over-the-counter and prescription medicines only as told by your doctor.  Take vitamins and supplements only as told by your doctor.  Think about joining a support group.  Keep all follow-up visits as told by your doctor. This is important. Contact a doctor if:  You cannot meet your weight loss goal after you have changed your diet and lifestyle for 6 weeks. Get help right away if you:  Are having trouble breathing.  Are having thoughts of harming yourself. Summary  Obesity is having too much body fat.  Being obese means that your weight is more than what is healthy for you.  Work with your doctor to set a weight-loss goal.  Get regular exercise as told by your doctor. This information is not intended to replace advice given to you by your health care provider. Make sure you discuss any questions you have with your health care provider. Document Revised: 05/19/2018 Document Reviewed: 05/19/2018 Elsevier Patient Education  2020 Elsevier Inc.   Prediabetes Eating Plan Prediabetes is a condition that causes blood sugar (glucose) levels to be higher than normal. This increases the risk for developing diabetes. In order to prevent diabetes from developing, your health care provider may recommend a diet and other lifestyle changes to help you:  Control your blood glucose levels.  Improve your cholesterol levels.  Manage your blood pressure. Your health care provider may recommend working with a diet and nutrition specialist (dietitian) to make  a meal plan that is best for you. What are tips for following this plan? Lifestyle  Set weight loss goals with the help of your health care team. It is recommended that most people with prediabetes lose 7% of their current body weight.  Exercise for at least 30 minutes at least 5 days a week.  Attend a support group or seek ongoing support from a mental health counselor.  Take over-the-counter and prescription medicines only as told by your health care provider. Reading food labels  Read food labels to check the amount of fat, salt (sodium), and sugar in prepackaged foods. Avoid foods that have: ? Saturated fats. ? Trans fats. ? Added sugars.  Avoid foods that have more than 300 milligrams (mg) of sodium per serving. Limit your daily sodium intake to less than 2,300 mg each day. Shopping  Avoid buying pre-made and processed foods. Cooking  Cook with olive oil. Do not use  butter, lard, or ghee.  Bake, broil, grill, or boil foods. Avoid frying. Meal planning   Work with your dietitian to develop an eating plan that is right for you. This may include: ? Tracking how many calories you take in. Use a food diary, notebook, or mobile application to track what you eat at each meal. ? Using the glycemic index (GI) to plan your meals. The index tells you how quickly a food will raise your blood glucose. Choose low-GI foods. These foods take a longer time to raise blood glucose.  Consider following a Mediterranean diet. This diet includes: ? Several servings each day of fresh fruits and vegetables. ? Eating fish at least twice a week. ? Several servings each day of whole grains, beans, nuts, and seeds. ? Using olive oil instead of other fats. ? Moderate alcohol consumption. ? Eating small amounts of red meat and whole-fat dairy.  If you have high blood pressure, you may need to limit your sodium intake or follow a diet such as the DASH eating plan. DASH is an eating plan that aims to  lower high blood pressure. What foods are recommended? The items listed below may not be a complete list. Talk with your dietitian about what dietary choices are best for you. Grains Whole grains, such as whole-wheat or whole-grain breads, crackers, cereals, and pasta. Unsweetened oatmeal. Bulgur. Barley. Quinoa. Brown rice. Corn or whole-wheat flour tortillas or taco shells. Vegetables Lettuce. Spinach. Peas. Beets. Cauliflower. Cabbage. Broccoli. Carrots. Tomatoes. Squash. Eggplant. Herbs. Peppers. Onions. Cucumbers. Brussels sprouts. Fruits Berries. Bananas. Apples. Oranges. Grapes. Papaya. Mango. Pomegranate. Kiwi. Grapefruit. Cherries. Meats and other protein foods Seafood. Poultry without skin. Lean cuts of pork and beef. Tofu. Eggs. Nuts. Beans. Dairy Low-fat or fat-free dairy products, such as yogurt, cottage cheese, and cheese. Beverages Water. Tea. Coffee. Sugar-free or diet soda. Seltzer water. Lowfat or no-fat milk. Milk alternatives, such as soy or almond milk. Fats and oils Olive oil. Canola oil. Sunflower oil. Grapeseed oil. Avocado. Walnuts. Sweets and desserts Sugar-free or low-fat pudding. Sugar-free or low-fat ice cream and other frozen treats. Seasoning and other foods Herbs. Sodium-free spices. Mustard. Relish. Low-fat, low-sugar ketchup. Low-fat, low-sugar barbecue sauce. Low-fat or fat-free mayonnaise. What foods are not recommended? The items listed below may not be a complete list. Talk with your dietitian about what dietary choices are best for you. Grains Refined white flour and flour products, such as bread, pasta, snack foods, and cereals. Vegetables Canned vegetables. Frozen vegetables with butter or cream sauce. Fruits Fruits canned with syrup. Meats and other protein foods Fatty cuts of meat. Poultry with skin. Breaded or fried meat. Processed meats. Dairy Full-fat yogurt, cheese, or milk. Beverages Sweetened drinks, such as sweet iced tea and  soda. Fats and oils Butter. Lard. Ghee. Sweets and desserts Baked goods, such as cake, cupcakes, pastries, cookies, and cheesecake. Seasoning and other foods Spice mixes with added salt. Ketchup. Barbecue sauce. Mayonnaise. Summary  To prevent diabetes from developing, you may need to make diet and other lifestyle changes to help control blood sugar, improve cholesterol levels, and manage your blood pressure.  Set weight loss goals with the help of your health care team. It is recommended that most people with prediabetes lose 7 percent of their current body weight.  Consider following a Mediterranean diet that includes plenty of fresh fruits and vegetables, whole grains, beans, nuts, seeds, fish, lean meat, low-fat dairy, and healthy oils. This information is not intended to replace advice given to  you by your health care provider. Make sure you discuss any questions you have with your health care provider. Document Revised: 01/06/2019 Document Reviewed: 11/18/2016 Elsevier Patient Education  2020 [employer].

## 2020-02-14 ENCOUNTER — Other Ambulatory Visit: Payer: Medicaid Other

## 2020-03-19 ENCOUNTER — Other Ambulatory Visit: Payer: Self-pay | Admitting: Internal Medicine

## 2020-03-19 DIAGNOSIS — E785 Hyperlipidemia, unspecified: Secondary | ICD-10-CM

## 2020-04-11 ENCOUNTER — Other Ambulatory Visit: Payer: Medicaid Other

## 2020-04-18 ENCOUNTER — Other Ambulatory Visit: Payer: Self-pay | Admitting: Internal Medicine

## 2020-04-18 DIAGNOSIS — E785 Hyperlipidemia, unspecified: Secondary | ICD-10-CM

## 2020-04-18 DIAGNOSIS — I10 Essential (primary) hypertension: Secondary | ICD-10-CM

## 2020-04-28 ENCOUNTER — Other Ambulatory Visit: Payer: Self-pay | Admitting: Internal Medicine

## 2020-04-28 DIAGNOSIS — M170 Bilateral primary osteoarthritis of knee: Secondary | ICD-10-CM

## 2020-04-28 NOTE — Telephone Encounter (Signed)
Requested Prescriptions  Pending Prescriptions Disp Refills   meloxicam (MOBIC) 15 MG tablet [Pharmacy Med Name: MELOXICAM 15MG  TABLETS] 90 tablet 1    Sig: TAKE 1 TABLET(15 MG) BY MOUTH DAILY     Analgesics:  COX2 Inhibitors Passed - 04/28/2020  1:22 PM      Passed - HGB in normal range and within 360 days    Hemoglobin  Date Value Ref Range Status  11/14/2019 12.1 11.1 - 15.9 g/dL Final         Passed - Cr in normal range and within 360 days    Creatinine, Ser  Date Value Ref Range Status  11/14/2019 0.89 0.57 - 1.00 mg/dL Final         Passed - Patient is not pregnant      Passed - Valid encounter within last 12 months    Recent Outpatient Visits          2 months ago Essential hypertension   [facility] 11/16/2019, MD   5 months ago Essential hypertension   [facility] Marcine Matar, MD   1 year ago Body aches   [facility] [place], [place], [place]   1 year ago Essential hypertension   [facility] [place], [place]   1 year ago Essential hypertension   [facility] Marcine Matar, MD      Future Appointments            In 1 month Marcine Matar, Laural Benes, MD [facility]

## 2020-05-15 ENCOUNTER — Other Ambulatory Visit: Payer: Medicaid Other

## 2020-05-17 ENCOUNTER — Other Ambulatory Visit: Payer: Self-pay | Admitting: Internal Medicine

## 2020-05-17 DIAGNOSIS — I10 Essential (primary) hypertension: Secondary | ICD-10-CM

## 2020-05-17 MED ORDER — FUROSEMIDE 20 MG PO TABS: 20.0000 mg | ORAL_TABLET | Freq: Every day | ORAL | 2 refills | Status: DC

## 2020-05-17 NOTE — Addendum Note (Signed)
Addended by: Cleotilde Neer on: 05/17/2020 02:29 PM   Modules accepted: Orders

## 2020-05-17 NOTE — Telephone Encounter (Signed)
furosemide (LASIX) 20 MG tablet      Patient requesting refill .    Pharmacy : [id number] [place], [place] - [address] AT [facility] OF [facility] & [address] Phone:  [phone]  Fax:  [phone]

## 2020-06-12 ENCOUNTER — Other Ambulatory Visit: Payer: Self-pay | Admitting: Internal Medicine

## 2020-06-12 DIAGNOSIS — E785 Hyperlipidemia, unspecified: Secondary | ICD-10-CM

## 2020-06-14 ENCOUNTER — Other Ambulatory Visit: Payer: Self-pay

## 2020-06-14 ENCOUNTER — Ambulatory Visit: Payer: Medicaid Other | Attending: Internal Medicine | Admitting: Internal Medicine

## 2020-06-14 DIAGNOSIS — E785 Hyperlipidemia, unspecified: Secondary | ICD-10-CM | POA: Diagnosis not present

## 2020-06-14 DIAGNOSIS — I10 Essential (primary) hypertension: Secondary | ICD-10-CM | POA: Diagnosis not present

## 2020-06-14 DIAGNOSIS — M170 Bilateral primary osteoarthritis of knee: Secondary | ICD-10-CM | POA: Diagnosis not present

## 2020-06-14 DIAGNOSIS — D509 Iron deficiency anemia, unspecified: Secondary | ICD-10-CM | POA: Diagnosis not present

## 2020-06-14 DIAGNOSIS — Z23 Encounter for immunization: Secondary | ICD-10-CM

## 2020-06-14 MED ORDER — FERROUS SULFATE 325 (65 FE) MG PO TABS: ORAL_TABLET | ORAL | 0 refills | Status: DC

## 2020-06-14 MED ORDER — ATORVASTATIN CALCIUM 20 MG PO TABS: ORAL_TABLET | ORAL | 6 refills | Status: DC

## 2020-06-14 MED ORDER — AMLODIPINE BESYLATE 5 MG PO TABS: 7.5000 mg | ORAL_TABLET | Freq: Every day | ORAL | 3 refills | Status: DC

## 2020-06-14 NOTE — Progress Notes (Signed)
Virtual Visit via Telephone Note Due to current restrictions/limitations of in-office visits due to the COVID-19 pandemic, this scheduled clinical appointment was converted to a telehealth visit  I connected with Katie Burns on 06/14/20 at 4:22 p.m by telephone and verified that I am speaking with the correct person using two identifiers. I am in my office.  The patient is at home.  Only the patient, her daughter and myself participated in this encounter.  I discussed the limitations, risks, security and privacy concerns of performing an evaluation and management service by telephone and the availability of in person appointments. I also discussed with the patient that there may be a patient responsible charge related to this service. The patient expressed understanding and agreed to proceed.   History of Present Illness: Pt with hx of preDM, HTN, HL, LBP, obesity, IDAand OA knees.Patient last seen in May. Today's visit is chronic ds management.  Her daughter is on the line with our son interprets.  HTN: reports compliance with meds and low salt diet.  She is on amlodipine 5 mg and metoprolol. Checks BP 2 x a wk - most recent #s are 144/66, 132/72, 142/75 No CP, SOB, LE edema, HA, dizziness.   HL:  Out of Lipitor x 2 wks.  Needs RF to be sent to her pharmacy.  Tolerating the medication okay.  OA Knee:  Doing okay on Mobic.  No falls.  Pt very active at home cleaning   History of iron deficiency anemia: Anemia had corrected on last CBC.  She is taking iron 3 times a week.  She is out and request a refill. Outpatient Encounter Medications as of 06/14/2020  Medication Sig  . amLODipine (NORVASC) 5 MG tablet Take 1 tablet (5 mg total) by mouth daily.  Marland Kitchen atorvastatin (LIPITOR) 20 MG tablet TAKE 1 TABLET(20 MG) BY MOUTH DAILY  . Blood Pressure Monitor DEVI Use as directed to check home blood pressure 2-3 times a week  . FEROSUL 325 (65 Fe) MG tablet TAKE 1 TABLET(325 MG) BY MOUTH DAILY WITH  BREAKFAST  . furosemide (LASIX) 20 MG tablet Take 1 tablet (20 mg total) by mouth daily.  . meloxicam (MOBIC) 15 MG tablet TAKE 1 TABLET(15 MG) BY MOUTH DAILY  . metoprolol tartrate (LOPRESSOR) 50 MG tablet TAKE 1 TABLET(50 MG) BY MOUTH DAILY   No facility-administered encounter medications on file as of 06/14/2020.      Observations/Objective: Results for orders placed or performed in visit on 11/14/19  Lipid panel  Result Value Ref Range   Cholesterol, Total 198 100 - 199 mg/dL   Triglycerides 409 (H) 0 - 149 mg/dL   HDL 65 >81 mg/dL   VLDL Cholesterol Cal 27 5 - 40 mg/dL   LDL Chol Calc (NIH) 191 (H) 0 - 99 mg/dL   Chol/HDL Ratio 3.0 0.0 - 4.4 ratio  Comprehensive metabolic panel  Result Value Ref Range   Glucose 137 (H) 65 - 99 mg/dL   BUN 13 8 - 27 mg/dL   Creatinine, Ser 4.78 0.57 - 1.00 mg/dL   GFR calc non Af Amer 65 >59 mL/min/1.73   GFR calc Af Amer 75 >59 mL/min/1.73   BUN/Creatinine Ratio 15 12 - 28   Sodium 141 134 - 144 mmol/L   Potassium 4.4 3.5 - 5.2 mmol/L   Chloride 103 96 - 106 mmol/L   CO2 22 20 - 29 mmol/L   Calcium 9.9 8.7 - 10.3 mg/dL   Total Protein 7.2 6.0 - 8.5 g/dL  Albumin 4.2 3.7 - 4.7 g/dL   Globulin, Total 3.0 1.5 - 4.5 g/dL   Albumin/Globulin Ratio 1.4 1.2 - 2.2   Bilirubin Total <0.2 0.0 - 1.2 mg/dL   Alkaline Phosphatase 98 39 - 117 IU/L   AST 24 0 - 40 IU/L   ALT 14 0 - 32 IU/L  CBC  Result Value Ref Range   WBC 10.2 3.4 - 10.8 x10E3/uL   RBC 4.00 3.77 - 5.28 x10E6/uL   Hemoglobin 12.1 11.1 - 15.9 g/dL   Hematocrit 79.3 90.3 - 46.6 %   MCV 90 79 - 97 fL   MCH 30.3 26.6 - 33.0 pg   MCHC 33.8 31 - 35 g/dL   RDW 00.9 23.3 - 00.7 %   Platelets 210 150 - 450 x10E3/uL  Hemoglobin A1c  Result Value Ref Range   Hgb A1c MFr Bld 5.7 (H) 4.8 - 5.6 %   Est. average glucose Bld gHb Est-mCnc 117 mg/dL  Specimen status report  Result Value Ref Range   specimen status report Comment      Assessment and Plan: 1. Essential  hypertension Not at goal.  I recommend increasing amlodipine to 7.5 mg daily.  Continue metoprolol. - amLODipine (NORVASC) 5 MG tablet; Take 1.5 tablets (7.5 mg total) by mouth daily.  Dispense: 135 tablet; Refill: 3  2. Hyperlipidemia, unspecified hyperlipidemia type LDL close to goal on last lipid profile.  Continue atorvastatin. - atorvastatin (LIPITOR) 20 MG tablet; TAKE 1 TABLET(20 MG) BY MOUTH DAILY  Dispense: 30 tablet; Refill: 6  3. Primary osteoarthritis of both knees Encouraged her to continue to remain active.  Continue meloxicam  4. Iron deficiency anemia, unspecified iron deficiency anemia type - ferrous sulfate (FEROSUL) 325 (65 FE) MG tablet; Take one tab PO Q Mon/Wed and Frid.  Dispense: 90 tablet; Refill: 0  5. Need for influenza vaccination Schedule to see our clinical pharmacist to get her flu shot.   Follow Up Instructions: 4 months.   I discussed the assessment and treatment plan with the patient. The patient was provided an opportunity to ask questions and all were answered. The patient agreed with the plan and demonstrated an understanding of the instructions.   The patient was advised to call back or seek an in-person evaluation if the symptoms worsen or if the condition fails to improve as anticipated.  I provided 11 minutes of non-face-to-face time during this encounter.   Jonah Blue, MD

## 2020-06-24 ENCOUNTER — Other Ambulatory Visit: Payer: Medicaid Other

## 2020-06-25 ENCOUNTER — Ambulatory Visit: Payer: Medicaid Other | Admitting: Pharmacist

## 2020-07-16 ENCOUNTER — Ambulatory Visit: Payer: Medicaid Other | Attending: Internal Medicine | Admitting: Pharmacist

## 2020-07-16 ENCOUNTER — Other Ambulatory Visit: Payer: Self-pay

## 2020-07-16 DIAGNOSIS — Z23 Encounter for immunization: Secondary | ICD-10-CM | POA: Diagnosis not present

## 2020-07-16 NOTE — Progress Notes (Signed)
Patient presents for vaccination against influenza per orders of Dr. Johnson. Consent given. Counseling provided. No contraindications exists. Vaccine administered without incident.  ° °Luke Van Ausdall, PharmD, CPP °Clinical Pharmacist °[facility] °[phone] ° °

## 2020-08-15 ENCOUNTER — Other Ambulatory Visit: Payer: Self-pay | Admitting: Internal Medicine

## 2020-08-15 DIAGNOSIS — I10 Essential (primary) hypertension: Secondary | ICD-10-CM

## 2020-09-22 DIAGNOSIS — H5213 Myopia, bilateral: Secondary | ICD-10-CM | POA: Diagnosis not present

## 2020-10-04 ENCOUNTER — Other Ambulatory Visit: Payer: Medicaid Other

## 2020-10-14 ENCOUNTER — Other Ambulatory Visit: Payer: Self-pay

## 2020-10-14 ENCOUNTER — Other Ambulatory Visit: Payer: Self-pay | Admitting: Internal Medicine

## 2020-10-14 ENCOUNTER — Ambulatory Visit: Payer: Medicaid Other | Attending: Internal Medicine | Admitting: Internal Medicine

## 2020-10-14 DIAGNOSIS — Z1159 Encounter for screening for other viral diseases: Secondary | ICD-10-CM | POA: Diagnosis not present

## 2020-10-14 DIAGNOSIS — M170 Bilateral primary osteoarthritis of knee: Secondary | ICD-10-CM | POA: Diagnosis not present

## 2020-10-14 DIAGNOSIS — I10 Essential (primary) hypertension: Secondary | ICD-10-CM

## 2020-10-14 DIAGNOSIS — R7303 Prediabetes: Secondary | ICD-10-CM

## 2020-10-14 DIAGNOSIS — E785 Hyperlipidemia, unspecified: Secondary | ICD-10-CM

## 2020-10-14 MED ORDER — MELOXICAM 15 MG PO TABS: ORAL_TABLET | ORAL | 1 refills | Status: DC

## 2020-10-14 MED ORDER — FUROSEMIDE 20 MG PO TABS: ORAL_TABLET | ORAL | 2 refills | Status: DC

## 2020-10-14 MED ORDER — AMLODIPINE BESYLATE 5 MG PO TABS: 7.5000 mg | ORAL_TABLET | Freq: Every day | ORAL | 3 refills | Status: DC

## 2020-10-14 MED ORDER — ATORVASTATIN CALCIUM 20 MG PO TABS: ORAL_TABLET | ORAL | 6 refills | Status: DC

## 2020-10-14 MED ORDER — METOPROLOL TARTRATE 50 MG PO TABS: 50.0000 mg | ORAL_TABLET | Freq: Every day | ORAL | 6 refills | Status: DC

## 2020-10-14 NOTE — Progress Notes (Signed)
Virtual Visit via Telephone Note  I connected with Katie Burns on 10/14/20 at  2:10 PM EST by telephone and verified that I am speaking with the correct person using two identifiers.  Location: Patient:home Provider: Working remotely from home.  pt, her daughter Paramjit, my CMA Julius Bowels and myself participated.  Daughter interprets. I discussed the limitations, risks, security and privacy concerns of performing an evaluation and management service by telephone and the availability of in person appointments. I also discussed with the patient that there may be a patient responsible charge related to this service. The patient expressed understanding and agreed to proceed.   History of Present Illness: Pt with hx ofpreDM,HTN, HL, LBP, obesity, IDAand OA knees, COVID-19 infection 03/2019.  Last evaluation 05/2020.  Purpose of today's visit is chronic disease management.  HYPERTENSION Currently taking: see medication list Med Adherence: [x]  Yes  On Norvasc 7.5 mg and Metoprolol 50 mg daily Medication side effects: []  Yes    [x]  No Adherence with salt restriction: [x]  Yes    []  No Home Monitoring?: [x]  Yes    []  No Monitoring Frequency:  2-3x/wk Home BP results range: BP this a.mwas 130/80 SOB? []  Yes    [x]  No Chest Pain?: []  Yes    [x]  No Leg swelling?: []  Yes    [x]  No Headaches?: []  Yes    [x]  No Dizziness? []  Yes    [x]  No Comments:    Reports some dryness in throat that causes her to cough x 3 days. Better when she dranks water. No sore or itchy throat.  No fever, SOB, loss taste.   OA:  Takes Mobic several times a wk. No falls.  She remains independent with her ADLs.  Daughter states that she is very active around the house cleaning and cooking and doing other household chores.  HL:  Taking and tolerating Lipitor  HM: She has not taken COVID booster as yet  Outpatient Encounter Medications as of 10/14/2020  Medication Sig  . amLODipine (NORVASC) 5 MG tablet Take 1.5 tablets  (7.5 mg total) by mouth daily.  atorvastatin (LIPITOR) 20 MG tablet TAKE 1 TABLET(20 MG) BY MOUTH DAILY  . Blood Pressure Monitor DEVI Use as directed to check home blood pressure 2-3 times a week  . ferrous sulfate (FEROSUL) 325 (65 FE) MG tablet Take one tab PO Q Mon/Wed and Frid.  . furosemide (LASIX) 20 MG tablet TAKE 1 TABLET(20 MG) BY MOUTH DAILY  . meloxicam (MOBIC) 15 MG tablet TAKE 1 TABLET(15 MG) BY MOUTH DAILY  . metoprolol tartrate (LOPRESSOR) 50 MG tablet TAKE 1 TABLET(50 MG) BY MOUTH DAILY   No facility-administered encounter medications on file as of 10/14/2020.      Observations/Objective: No direct observation done as this was a telephone visit.  Assessment and Plan: 1. Essential hypertension At goal.  Continue current medications and low-salt diet. - CBC; Future - Comprehensive metabolic panel; Future - metoprolol tartrate (LOPRESSOR) 50 MG tablet; Take 1 tablet (50 mg total) by mouth daily.  Dispense: 30 tablet; Refill: 6 - furosemide (LASIX) 20 MG tablet; TAKE 1 TABLET(20 MG) BY MOUTH DAILY  Dispense: 30 tablet; Refill: 2 - amLODipine (NORVASC) 5 MG tablet; Take 1.5 tablets (7.5 mg total) by mouth daily.  Dispense: 135 tablet; Refill: 3  2. Hyperlipidemia, unspecified hyperlipidemia type Continue atorvastatin.  She will come to the lab later this week to have blood test done. - Lipid panel; Future - atorvastatin (LIPITOR) 20 MG tablet; TAKE 1 TABLET(20  MG) BY MOUTH DAILY  Dispense: 30 tablet; Refill: 6  3. Primary osteoarthritis of both knees Stable on meloxicam which she takes as needed.  Encouraged her to remain active - meloxicam (MOBIC) 15 MG tablet; TAKE 1 TABLET(15 MG) BY MOUTH DAILY  Dispense: 90 tablet; Refill: 1  4. Prediabetes Encourage healthy eating habits.  We will check A1c when she comes to the lab. - Hemoglobin A1c; Future  5. Need for hepatitis C screening test Due for screening. - Hepatitis C Antibody; Future  6.  COVID-19  vaccine Encourage patient to go and get the COVID-19 booster shot which she is due for.  Follow Up Instructions: 4 mths   I discussed the assessment and treatment plan with the patient. The patient was provided an opportunity to ask questions and all were answered. The patient agreed with the plan and demonstrated an understanding of the instructions.   The patient was advised to call back or seek an in-person evaluation if the symptoms worsen or if the condition fails to improve as anticipated.  I provided 10 minutes of non-face-to-face time during this encounter.   Jonah Blue, MD

## 2020-10-14 NOTE — Telephone Encounter (Signed)
Requested medication (s) are due for refill today:   Yes  Requested medication (s) are on the active medication list:   Yes  Future visit scheduled:   Today (10/14/2020) at 2:10 with Dr. Laural Benes.   Address refills during OV.   Last ordered: Has OV today.  Returned because pt has an appt today at 2:10 with Dr. Laural Benes.   Requested Prescriptions  Pending Prescriptions Disp Refills   metoprolol tartrate (LOPRESSOR) 50 MG tablet [Pharmacy Med Name: METOPROLOL TARTRATE 50MG  TABLETS] 30 tablet 6    Sig: TAKE 1 TABLET(50 MG) BY MOUTH DAILY      Cardiovascular:  Beta Blockers Passed - 10/14/2020  7:06 AM      Passed - Last BP in normal range    BP Readings from Last 1 Encounters:  02/12/20 138/60          Passed - Last Heart Rate in normal range    Pulse Readings from Last 1 Encounters:  02/12/20 61          Passed - Valid encounter within last 6 months    Recent Outpatient Visits           3 months ago Need for influenza vaccination   [facility] [place] [facility], Lois Huxley, RPH-CPP   4 months ago Essential hypertension   [facility] Cornelius Moras, MD   8 months ago Essential hypertension   [place] [facility] Marcine Matar, MD   11 months ago Essential hypertension   [place] [facility] Marcine Matar, MD   1 year ago Body aches   [facility] [place], [place], [place]       Future Appointments             Today [place], MD [place] [facility]               meloxicam (MOBIC) 15 MG tablet [Pharmacy Med Name: MELOXICAM 15MG  TABLETS] 90 tablet 1    Sig: TAKE 1 TABLET(15 MG) BY MOUTH DAILY      Analgesics:  COX2 Inhibitors Passed - 10/14/2020  7:06 AM      Passed - HGB in normal range and within 360 days    Hemoglobin  Date Value Ref Range Status  11/14/2019 12.1 11.1 - 15.9 g/dL Final           Passed - Cr in normal range and within 360 days    Creatinine, Ser  Date Value Ref Range Status  11/14/2019 0.89 0.57 - 1.00 mg/dL Final          Passed - Patient is not pregnant      Passed - Valid encounter within last 12 months    Recent Outpatient Visits           3 months ago Need for influenza vaccination   [facility] 11/16/2019, [facility], RPH-CPP   4 months ago Essential hypertension   [place] [facility] Lois Huxley, [place]   8 months ago Essential hypertension   [facility] Marcine Matar, MD   11 months ago Essential hypertension   [facility] Marcine Matar, MD   1 year ago Body aches   [facility] [place], [place], [place]  Future Appointments             Today Marcine Matar, MD [facility]

## 2020-10-25 ENCOUNTER — Other Ambulatory Visit: Payer: Self-pay | Admitting: Internal Medicine

## 2020-10-25 DIAGNOSIS — Z1231 Encounter for screening mammogram for malignant neoplasm of breast: Secondary | ICD-10-CM

## 2020-10-28 ENCOUNTER — Ambulatory Visit: Payer: Medicaid Other | Attending: Internal Medicine

## 2020-10-28 ENCOUNTER — Other Ambulatory Visit: Payer: Self-pay

## 2020-10-28 DIAGNOSIS — E785 Hyperlipidemia, unspecified: Secondary | ICD-10-CM | POA: Diagnosis not present

## 2020-10-28 DIAGNOSIS — R7303 Prediabetes: Secondary | ICD-10-CM

## 2020-10-28 DIAGNOSIS — I10 Essential (primary) hypertension: Secondary | ICD-10-CM | POA: Diagnosis not present

## 2020-10-28 DIAGNOSIS — Z1159 Encounter for screening for other viral diseases: Secondary | ICD-10-CM

## 2020-10-29 LAB — COMPREHENSIVE METABOLIC PANEL
ALT: 15 IU/L (ref 0–32)
AST: 17 IU/L (ref 0–40)
Albumin/Globulin Ratio: 1.6 (ref 1.2–2.2)
Albumin: 4.2 g/dL (ref 3.7–4.7)
Alkaline Phosphatase: 88 IU/L (ref 44–121)
BUN/Creatinine Ratio: 22 (ref 12–28)
BUN: 25 mg/dL (ref 8–27)
Bilirubin Total: 0.3 mg/dL (ref 0.0–1.2)
CO2: 21 mmol/L (ref 20–29)
Calcium: 10 mg/dL (ref 8.7–10.3)
Chloride: 101 mmol/L (ref 96–106)
Creatinine, Ser: 1.12 mg/dL — ABNORMAL HIGH (ref 0.57–1.00)
GFR calc Af Amer: 56 mL/min/{1.73_m2} — ABNORMAL LOW (ref >59)
GFR calc non Af Amer: 49 mL/min/{1.73_m2} — ABNORMAL LOW (ref >59)
Globulin, Total: 2.6 g/dL (ref 1.5–4.5)
Glucose: 98 mg/dL (ref 65–99)
Potassium: 5 mmol/L (ref 3.5–5.2)
Sodium: 136 mmol/L (ref 134–144)
Total Protein: 6.8 g/dL (ref 6.0–8.5)

## 2020-10-29 LAB — CBC
Hematocrit: 34.1 % (ref 34.0–46.6)
Hemoglobin: 11.5 g/dL (ref 11.1–15.9)
MCH: 31 pg (ref 26.6–33.0)
MCHC: 33.7 g/dL (ref 31.5–35.7)
MCV: 92 fL (ref 79–97)
Platelets: 215 10*3/uL (ref 150–450)
RBC: 3.71 x10E6/uL — ABNORMAL LOW (ref 3.77–5.28)
RDW: 12.3 % (ref 11.7–15.4)
WBC: 11.1 10*3/uL — ABNORMAL HIGH (ref 3.4–10.8)

## 2020-10-29 LAB — HEPATITIS C ANTIBODY: Hep C Virus Ab: <0.1 s/co ratio (ref 0.0–0.9)

## 2020-10-29 LAB — LIPID PANEL
Chol/HDL Ratio: 2.9 ratio (ref 0.0–4.4)
Cholesterol, Total: 180 mg/dL (ref 100–199)
HDL: 62 mg/dL (ref >39)
LDL Chol Calc (NIH): 90 mg/dL (ref 0–99)
Triglycerides: 165 mg/dL — ABNORMAL HIGH (ref 0–149)
VLDL Cholesterol Cal: 28 mg/dL (ref 5–40)

## 2020-10-29 LAB — HEMOGLOBIN A1C
Est. average glucose Bld gHb Est-mCnc: 123 mg/dL
Hgb A1c MFr Bld: 5.9 % — ABNORMAL HIGH (ref 4.8–5.6)

## 2020-10-30 ENCOUNTER — Telehealth: Payer: Self-pay

## 2020-10-30 NOTE — Progress Notes (Signed)
Let patient know that she is still in the range for prediabetes.  Continue healthy eating habits and try to stay active.  Screen for hepatitis C is negative.  Cholesterol level is good.  Continue atorvastatin.  Liver function tests normal.  Kidney function not 100% and has declined compared to 11 months ago when it was last checked.  Make sure she is drinking several glasses of water daily.  Try not to take the meloxicam every day.  If needed she can take Tylenol for her arthritis pain.  We will plan to recheck kidney function on subsequent visit.

## 2020-10-30 NOTE — Telephone Encounter (Signed)
Contacted pt to go over lab results spoke to pt daughter Katie Burns and provided results pt daughter doesn't have any questions or concerns

## 2020-12-13 ENCOUNTER — Ambulatory Visit
Admission: RE | Admit: 2020-12-13 | Discharge: 2020-12-13 | Disposition: A | Discharge status: Home / Self Care | Payer: Medicaid Other | Source: Ambulatory Visit | Attending: Internal Medicine | Admitting: Internal Medicine

## 2020-12-13 ENCOUNTER — Ambulatory Visit: Payer: Medicaid Other

## 2020-12-13 ENCOUNTER — Other Ambulatory Visit: Payer: Self-pay

## 2020-12-13 DIAGNOSIS — Z1231 Encounter for screening mammogram for malignant neoplasm of breast: Secondary | ICD-10-CM | POA: Diagnosis not present

## 2020-12-16 ENCOUNTER — Telehealth: Payer: Self-pay

## 2020-12-16 NOTE — Telephone Encounter (Signed)
Contacted pt to go over mm results spoke with pt daughter and she doesn't have any questions or concerns

## 2021-01-01 ENCOUNTER — Other Ambulatory Visit: Payer: Medicaid Other

## 2021-02-11 ENCOUNTER — Ambulatory Visit: Payer: Medicaid Other | Attending: Internal Medicine | Admitting: Internal Medicine

## 2021-02-11 ENCOUNTER — Encounter: Payer: Self-pay | Admitting: Internal Medicine

## 2021-02-11 ENCOUNTER — Other Ambulatory Visit: Payer: Self-pay | Admitting: Internal Medicine

## 2021-02-11 ENCOUNTER — Other Ambulatory Visit: Payer: Self-pay

## 2021-02-11 VITALS — BP 128/72 | HR 71 | Resp 16 | Wt 164.2 lb

## 2021-02-11 DIAGNOSIS — E785 Hyperlipidemia, unspecified: Secondary | ICD-10-CM | POA: Diagnosis not present

## 2021-02-11 DIAGNOSIS — I10 Essential (primary) hypertension: Secondary | ICD-10-CM

## 2021-02-11 DIAGNOSIS — N289 Disorder of kidney and ureter, unspecified: Secondary | ICD-10-CM | POA: Diagnosis not present

## 2021-02-11 DIAGNOSIS — E669 Obesity, unspecified: Secondary | ICD-10-CM

## 2021-02-11 DIAGNOSIS — M170 Bilateral primary osteoarthritis of knee: Secondary | ICD-10-CM | POA: Diagnosis not present

## 2021-02-11 NOTE — Telephone Encounter (Signed)
  Notes to clinic:  Patient has office visit today    Requested Prescriptions  Pending Prescriptions Disp Refills   furosemide (LASIX) 20 MG tablet [Pharmacy Med Name: FUROSEMIDE 20MG  TABLETS] 30 tablet 2    Sig: TAKE 1 TABLET(20 MG) BY MOUTH DAILY      Cardiovascular:  Diuretics - Loop Failed - 02/11/2021  7:03 AM      Failed - Cr in normal range and within 360 days    Creatinine, Ser  Date Value Ref Range Status  10/28/2020 1.12 (H) 0.57 - 1.00 mg/dL Final          Passed - K in normal range and within 360 days    Potassium  Date Value Ref Range Status  10/28/2020 5.0 3.5 - 5.2 mmol/L Final          Passed - Ca in normal range and within 360 days    Calcium  Date Value Ref Range Status  10/28/2020 10.0 8.7 - 10.3 mg/dL Final          Passed - Na in normal range and within 360 days    Sodium  Date Value Ref Range Status  10/28/2020 136 134 - 144 mmol/L Final          Passed - Last BP in normal range    BP Readings from Last 1 Encounters:  02/12/20 138/60          Passed - Valid encounter within last 6 months    Recent Outpatient Visits           4 months ago Essential hypertension   [place] [facility] 02/14/20, MD   7 months ago Need for influenza vaccination   [facility], Lois Huxley, RPH-CPP   8 months ago Essential hypertension   [place] [facility] Cornelius Moras, MD   1 year ago Essential hypertension   [place] [facility] Marcine Matar, MD   1 year ago Essential hypertension   [place] [facility] Marcine Matar, MD       Future Appointments             Today Marcine Matar, MD [facility]

## 2021-02-11 NOTE — Patient Instructions (Signed)
Please note that it is okay for you to use Tylenol as needed for your arthritis pain.  This can be purchased over-the-counter.  You can also purchase Voltaren gel over-the-counter and use it on your knees.  This is an anti-inflammatory gel rub that helps to reduce pain from arthritis.  We will recheck your kidney function today.  We will call you with the results.

## 2021-02-11 NOTE — Progress Notes (Signed)
Patient ID: Katie Burns, adult    DOB: [birth year]  MRN: [id number]  CC: Hypertension   Subjective: Katie Burns is a 75 y.o. adult who presents for chronic ds management.  Son-in-law Dineen Kid is with her and interprets.  Interpreter was offered through [facility] but they declined. Her concerns today include:  Pt with hx ofpreDM,HTN, HL, LBP, obesity, IDAand OA knees, COVID-19 infection 03/2019.   HTN: compliant with meds which are amlodipine, metoprolol and furosemide. Checks BP at home but forgot to bring readings. Daughter tells her it has been good.  Sometimes 140/60-80 Limits salt No CP/SOB/ Some LE edema with prolong sitting GFR on last blood test was 49 from 65 previously.  knees still bother her from time to time but she currently takes nothing for it.  She stopped Mobic after receiving news about the kidneys.  No falls Very active at home.  Wgh down 5 lbs over past yr Taking and tolerating atorvastatin.     Patient Active Problem List   Diagnosis Date Noted  . Prediabetes 11/19/2019  . Essential hypertension 10/18/2017  . Hyperlipidemia 10/18/2017  . Primary osteoarthritis of both knees 10/18/2017  . Obesity (BMI 30.0-34.9) 10/18/2017     Current Outpatient Medications on File Prior to Visit  Medication Sig Dispense Refill  . amLODipine (NORVASC) 5 MG tablet Take 1.5 tablets (7.5 mg total) by mouth daily. 135 tablet 3  . atorvastatin (LIPITOR) 20 MG tablet TAKE 1 TABLET(20 MG) BY MOUTH DAILY 30 tablet 6  . Blood Pressure Monitor DEVI Use as directed to check home blood pressure 2-3 times a week 1 Device 0  . ferrous sulfate (FEROSUL) 325 (65 FE) MG tablet Take one tab PO Q Mon/Wed and Frid. 90 tablet 0  . furosemide (LASIX) 20 MG tablet TAKE 1 TABLET(20 MG) BY MOUTH DAILY 30 tablet 2  . metoprolol tartrate (LOPRESSOR) 50 MG tablet Take 1 tablet (50 mg total) by mouth daily. 30 tablet 6   No current facility-administered medications on file prior to visit.     No Known Allergies  Social History   Socioeconomic History  . Marital status: Married    Spouse name: Not on file  . Number of children: 6  . Years of education: 5 th grade  . Highest education level: Not on file  Occupational History  . Occupation: unempolyed  Tobacco Use  . Smoking status: Never Smoker  . Smokeless tobacco: Never Used  Vaping Use  . Vaping Use: Never used  Substance and Sexual Activity  . Alcohol use: No  . Drug use: No  . Sexual activity: Not on file  Other Topics Concern  . Not on file  Social History Narrative  . Not on file   Social Determinants of Health   Financial Resource Strain: Not on file  Food Insecurity: Not on file  Transportation Needs: Not on file  Physical Activity: Not on file  Stress: Not on file  Social Connections: Not on file  Intimate Partner Violence: Not on file    Family History  Problem Relation Age of Onset  . Hypertension Daughter   . Breast cancer Neg Hx     Past Surgical History:  Procedure Laterality Date  . CATARACT EXTRACTION, BILATERAL Bilateral 2017   with lens implant  . EYE SURGERY Bilateral 2015   cataract extractionwith lens implant    ROS: Review of Systems Negative except as stated above  PHYSICAL EXAM: BP 128/72   Pulse 71   Resp  16   Wt 164 lb 3.2 oz (74.5 kg)   SpO2 97%   BMI 34.32 kg/m   Wt Readings from Last 3 Encounters:  02/11/21 164 lb 3.2 oz (74.5 kg)  02/12/20 169 lb 12.8 oz (77 kg)  02/16/19 173 lb 9.6 oz (78.7 kg)    Physical Exam  General appearance - alert, well appearing, and in no distress Mental status - normal mood, behavior, speech, dress, motor activity, and thought processes Chest - clear to auscultation, no wheezes, rales or rhonchi, symmetric air entry Heart - normal rate, regular rhythm, normal S1, S2, no murmurs, rubs, clicks or gallops Musculoskeletal -knee joints are enlarged.  She is slowed passive range of motion of both knee with mild to moderate  crepitus on the right.  Gait is slow Extremities - peripheral pulses normal, no pedal edema, no clubbing or cyanosis   CMP Latest Ref Rng & Units 10/28/2020 11/14/2019 10/18/2018  Glucose 65 - 99 mg/dL 98 749(S) 94  BUN 8 - 27 mg/dL 25 13 20   Creatinine 0.57 - 1.00 mg/dL ) 4.96(P 5.91  Sodium 134 - 144 mmol/L 136 141 138  Potassium 3.5 - 5.2 mmol/L 5.0 4.4 4.2  Chloride 96 - 106 mmol/L 101 103 99  CO2 20 - 29 mmol/L 21 22 20   Calcium 8.7 - 10.3 mg/dL 6.38 9.9 9.6  Total Protein 6.0 - 8.5 g/dL 6.8 7.2 7.1  Total Bilirubin 0.0 - 1.2 mg/dL 0.3 46.6  Alkaline Phos 44 - 121 IU/L 88 98 91  AST 0 - 40 IU/L 17 24 16   ALT 0 - 32 IU/L 15 14 13    Lipid Panel     Component Value Date/Time   CHOL 180 10/28/2020 1032   TRIG 165 (H) 10/28/2020 1032   HDL 62 10/28/2020 1032   CHOLHDL 2.9 10/28/2020 1032   LDLCALC 90 10/28/2020 1032    CBC    Component Value Date/Time   WBC 11.1 (H) 10/28/2020 1032   RBC 3.71 (L) 10/28/2020 1032   HGB 11.5 10/28/2020 1032   HCT 34.1 10/28/2020 1032   PLT 215 10/28/2020 1032   MCV 92 10/28/2020 1032   MCH 31.0 10/28/2020 1032   MCHC 33.7 10/28/2020 1032   RDW 12.3 10/28/2020 1032    ASSESSMENT AND PLAN: 1. Essential hypertension At goal.  Continue current medications and low-salt diet - Microalbumin / creatinine urine ratio - Basic metabolic panel  2. Primary osteoarthritis of both knees I recommend using Tylenol over-the-counter plus Voltaren gel.  Encourage further weight loss  3. Hyperlipidemia, unspecified hyperlipidemia type Continue atorvastatin.  4. Obesity (BMI 30.0-34.9) Encourage healthy eating habits.  Encouraged her to remain as active as she can given the arthritis in the knees.  5. Renal insufficiency Plan to recheck chemistry today.  We will also check a microalbumin.     Patient was given the opportunity to ask questions.  Patient verbalized understanding of the plan and was able to repeat key elements of the plan.    Orders Placed This Encounter  Procedures  . Microalbumin / creatinine urine ratio  . Basic metabolic panel     Requested Prescriptions    No prescriptions requested or ordered in this encounter    Return in about 4 months (around 06/14/2021).  10/30/2020, MD, FACP

## 2021-02-12 LAB — MICROALBUMIN / CREATININE URINE RATIO
Creatinine, Urine: 18 mg/dL
Microalb/Creat Ratio: <17 mg/g{creat} (ref 0–29)
Microalbumin, Urine: <3 ug/mL

## 2021-02-12 LAB — BASIC METABOLIC PANEL WITH GFR
BUN/Creatinine Ratio: 20 (ref 12–28)
BUN: 17 mg/dL (ref 8–27)
CO2: 23 mmol/L (ref 20–29)
Calcium: 10.2 mg/dL (ref 8.7–10.3)
Chloride: 100 mmol/L (ref 96–106)
Creatinine, Ser: 0.84 mg/dL (ref 0.57–1.00)
Glucose: 91 mg/dL (ref 65–99)
Potassium: 4.3 mmol/L (ref 3.5–5.2)
Sodium: 139 mmol/L (ref 134–144)
eGFR: 73 mL/min/{1.73_m2}

## 2021-02-17 ENCOUNTER — Telehealth: Payer: Self-pay

## 2021-02-17 NOTE — Telephone Encounter (Signed)
Contacted pt to go over lab results spoke with pt daughter and she doesn't have any questions or concerns

## 2021-04-07 ENCOUNTER — Other Ambulatory Visit: Payer: Self-pay | Admitting: Internal Medicine

## 2021-04-07 DIAGNOSIS — M170 Bilateral primary osteoarthritis of knee: Secondary | ICD-10-CM

## 2021-04-28 ENCOUNTER — Other Ambulatory Visit: Payer: Self-pay | Admitting: Internal Medicine

## 2021-04-28 DIAGNOSIS — I10 Essential (primary) hypertension: Secondary | ICD-10-CM

## 2021-04-28 NOTE — Telephone Encounter (Signed)
Requested Prescriptions  Pending Prescriptions Disp Refills  . furosemide (LASIX) 20 MG tablet [Pharmacy Med Name: FUROSEMIDE 20MG  TABLETS] 30 tablet 2    Sig: TAKE 1 TABLET(20 MG) BY MOUTH DAILY     Cardiovascular:  Diuretics - Loop Passed - 04/28/2021  8:38 PM      Passed - K in normal range and within 360 days    Potassium  Date Value Ref Range Status  02/11/2021 4.3 3.5 - 5.2 mmol/L Final         Passed - Ca in normal range and within 360 days    Calcium  Date Value Ref Range Status  02/11/2021 10.2 8.7 - 10.3 mg/dL Final         Passed - Na in normal range and within 360 days    Sodium  Date Value Ref Range Status  02/11/2021 139 134 - 144 mmol/L Final         Passed - Cr in normal range and within 360 days    Creatinine, Ser  Date Value Ref Range Status  02/11/2021 0.84 0.57 - 1.00 mg/dL Final         Passed - Last BP in normal range    BP Readings from Last 1 Encounters:  02/11/21 128/72         Passed - Valid encounter within last 6 months    Recent Outpatient Visits          2 months ago Essential hypertension   [facility] 02/13/21, [place]   6 months ago Essential hypertension   [place] [facility] Marcine Matar, MD   9 months ago Need for influenza vaccination   [facility], Lois Huxley, RPH-CPP   10 months ago Essential hypertension   [place] [facility] Cornelius Moras, MD   1 year ago Essential hypertension   [place] [facility] Marcine Matar, MD

## 2021-04-29 ENCOUNTER — Other Ambulatory Visit: Payer: Self-pay | Admitting: Internal Medicine

## 2021-04-29 DIAGNOSIS — I10 Essential (primary) hypertension: Secondary | ICD-10-CM

## 2021-04-29 NOTE — Telephone Encounter (Signed)
Requested Prescriptions  Pending Prescriptions Disp Refills  . metoprolol tartrate (LOPRESSOR) 50 MG tablet [Pharmacy Med Name: METOPROLOL TARTRATE 50MG  TABLETS] 30 tablet 5    Sig: TAKE 1 TABLET(50 MG) BY MOUTH DAILY     Cardiovascular:  Beta Blockers Passed - 04/29/2021  4:46 PM      Passed - Last BP in normal range    BP Readings from Last 1 Encounters:  02/11/21 128/72         Passed - Last Heart Rate in normal range    Pulse Readings from Last 1 Encounters:  02/11/21 71         Passed - Valid encounter within last 6 months    Recent Outpatient Visits          2 months ago Essential hypertension   [facility] 02/13/21, MD   6 months ago Essential hypertension   [facility], MD   9 months ago Need for influenza vaccination   [facility], Lois Huxley, RPH-CPP   10 months ago Essential hypertension   [facility] Cornelius Moras, MD   1 year ago Essential hypertension   [facility] Marcine Matar, MD

## 2021-08-24 ENCOUNTER — Other Ambulatory Visit: Payer: Self-pay | Admitting: Internal Medicine

## 2021-08-24 DIAGNOSIS — E785 Hyperlipidemia, unspecified: Secondary | ICD-10-CM

## 2021-08-24 DIAGNOSIS — I10 Essential (primary) hypertension: Secondary | ICD-10-CM

## 2021-09-11 ENCOUNTER — Other Ambulatory Visit: Payer: Self-pay

## 2021-09-11 ENCOUNTER — Encounter: Payer: Self-pay | Admitting: Internal Medicine

## 2021-09-11 ENCOUNTER — Ambulatory Visit: Payer: Medicaid Other | Attending: Internal Medicine | Admitting: Internal Medicine

## 2021-09-11 VITALS — BP 119/72 | HR 68 | Resp 16 | Ht 62.0 in | Wt 163.2 lb

## 2021-09-11 DIAGNOSIS — Z79899 Other long term (current) drug therapy: Secondary | ICD-10-CM | POA: Insufficient documentation

## 2021-09-11 DIAGNOSIS — Z8616 Personal history of COVID-19: Secondary | ICD-10-CM | POA: Diagnosis not present

## 2021-09-11 DIAGNOSIS — E663 Overweight: Secondary | ICD-10-CM | POA: Diagnosis not present

## 2021-09-11 DIAGNOSIS — M170 Bilateral primary osteoarthritis of knee: Secondary | ICD-10-CM | POA: Diagnosis not present

## 2021-09-11 DIAGNOSIS — D509 Iron deficiency anemia, unspecified: Secondary | ICD-10-CM | POA: Diagnosis not present

## 2021-09-11 DIAGNOSIS — Z6830 Body mass index (BMI) 30.0-30.9, adult: Secondary | ICD-10-CM | POA: Diagnosis not present

## 2021-09-11 DIAGNOSIS — Z23 Encounter for immunization: Secondary | ICD-10-CM | POA: Diagnosis not present

## 2021-09-11 DIAGNOSIS — Z6828 Body mass index (BMI) 28.0-28.9, adult: Secondary | ICD-10-CM | POA: Diagnosis not present

## 2021-09-11 DIAGNOSIS — I10 Essential (primary) hypertension: Secondary | ICD-10-CM | POA: Insufficient documentation

## 2021-09-11 DIAGNOSIS — M545 Low back pain, unspecified: Secondary | ICD-10-CM | POA: Diagnosis not present

## 2021-09-11 DIAGNOSIS — R7303 Prediabetes: Secondary | ICD-10-CM | POA: Insufficient documentation

## 2021-09-11 DIAGNOSIS — E785 Hyperlipidemia, unspecified: Secondary | ICD-10-CM | POA: Diagnosis not present

## 2021-09-11 MED ORDER — ZOSTER VAC RECOMB ADJUVANTED 50 MCG/0.5ML IM SUSR: 0.5000 mL | Freq: Once | INTRAMUSCULAR | 1 refills | Status: AC

## 2021-09-11 MED ORDER — AMLODIPINE BESYLATE 5 MG PO TABS: 7.5000 mg | ORAL_TABLET | Freq: Every day | ORAL | 3 refills | Status: DC

## 2021-09-11 MED ORDER — FERROUS SULFATE 325 (65 FE) MG PO TABS: ORAL_TABLET | ORAL | 1 refills | Status: DC

## 2021-09-11 MED ORDER — ZOSTER VAC RECOMB ADJUVANTED 50 MCG/0.5ML IM SUSR: 0.5000 mL | Freq: Once | INTRAMUSCULAR | 0 refills | Status: DC

## 2021-09-11 MED ORDER — DICLOFENAC SODIUM 1 % EX GEL: 2.0000 g | Freq: Four times a day (QID) | CUTANEOUS | 1 refills | Status: DC

## 2021-09-11 MED ORDER — METOPROLOL TARTRATE 50 MG PO TABS: ORAL_TABLET | ORAL | 2 refills | Status: DC

## 2021-09-11 NOTE — Progress Notes (Signed)
Patient ID: Katie Burns, adult    DOB: November 13, 1945  MRN: [id number]  CC: Hypertension   Subjective: Katie Burns is a 75 y.o. adult who presents for chronic ds management.  Patient's son, Ella Jubilee, is with her and interprets.  Her husband is also with her. His concerns today include:  Pt with hx of preDM, HTN, HL, LBP, obesity, IDA and OA knees, COVID-19 infection 03/2019.   HTN: taking and tolerating norvasc and metoprolol NO CP/SOB/HA/dizziness/ +edema with prolong stand  OA of the knees: Right knee is the one that bothers her the most.  She takes Tylenol but does not have to take it every day.  She walks up and down the stairs in the house for exercise during the day.  Weight has remained fairly stable from when I last saw her in May.  IDA:  taking iron.  Denies fatigue.  She reports good appetite.  She is sleeping well.  She is moving her bowel and bladder without difficulty.  Patient Active Problem List   Diagnosis Date Noted   Prediabetes 11/19/2019   Essential hypertension 10/18/2017   Hyperlipidemia 10/18/2017   Primary osteoarthritis of both knees 10/18/2017   Obesity (BMI 30.0-34.9) 10/18/2017     Current Outpatient Medications on File Prior to Visit  Medication Sig Dispense Refill   amLODipine (NORVASC) 5 MG tablet Take 1.5 tablets (7.5 mg total) by mouth daily. 135 tablet 3   atorvastatin (LIPITOR) 20 MG tablet TAKE 1 TABLET(20 MG) BY MOUTH DAILY 30 tablet 6   Blood Pressure Monitor DEVI Use as directed to check home blood pressure 2-3 times a week 1 Device 0   ferrous sulfate (FEROSUL) 325 (65 FE) MG tablet Take one tab PO Q Mon/Wed and Frid. 90 tablet 0   furosemide (LASIX) 20 MG tablet TAKE 1 TABLET(20 MG) BY MOUTH DAILY 30 tablet 2   metoprolol tartrate (LOPRESSOR) 50 MG tablet TAKE 1 TABLET(50 MG) BY MOUTH DAILY 30 tablet 5   No current facility-administered medications on file prior to visit.    No Known Allergies  Social History   Socioeconomic History    Marital status: Married    Spouse name: Not on file   Number of children: 6   Years of education: 5 th grade   Highest education level: Not on file  Occupational History   Occupation: unempolyed  Tobacco Use   Smoking status: Never   Smokeless tobacco: Never  Vaping Use   Vaping Use: Never used  Substance and Sexual Activity   Alcohol use: No   Drug use: No   Sexual activity: Not on file  Other Topics Concern   Not on file  Social History Narrative   Not on file   Social Determinants of Health   Financial Resource Strain: Not on file  Food Insecurity: Not on file  Transportation Needs: Not on file  Physical Activity: Not on file  Stress: Not on file  Social Connections: Not on file  Intimate Partner Violence: Not on file    Family History  Problem Relation Age of Onset   Hypertension Daughter    Breast cancer Neg Hx     Past Surgical History:  Procedure Laterality Date   CATARACT EXTRACTION, BILATERAL Bilateral 2017   with lens implant   EYE SURGERY Bilateral 2015   cataract extractionwith lens implant    ROS: Review of Systems Negative except as stated above  PHYSICAL EXAM: BP 119/72    Pulse 68  Resp 16    Ht 5\' 2"  (1.575 m)    Wt 163 lb 3.2 oz (74 kg)    SpO2 98%    BMI 29.85 kg/m   Wt Readings from Last 3 Encounters:  09/11/21 163 lb 3.2 oz (74 kg)  02/11/21 164 lb 3.2 oz (74.5 kg)  02/12/20 169 lb 12.8 oz (77 kg)    Physical Exam  General appearance - alert, well appearing, and in no distress Mental status - normal mood, behavior, speech, dress, motor activity, and thought processes Neck - supple, no significant adenopathy Chest - clear to auscultation, no wheezes, rales or rhonchi, symmetric air entry Heart - normal rate, regular rhythm, normal S1, S2, no murmurs, rubs, clicks or gallops Musculoskeletal -knees: Knee joints are enlarged.  No point tenderness.  She has mild discomfort with passive range of motion of the right  knee. Extremities -no lower extremity edema.  CMP Latest Ref Rng & Units 02/11/2021 10/28/2020 11/14/2019  Glucose 65 - 99 mg/dL 91 98 137(H)  BUN 8 - 27 mg/dL 17 25 13   Creatinine 0.57 - 1.00 mg/dL 0.84 1.12(H) 0.89  Sodium 134 - 144 mmol/L 139 136 141  Potassium 3.5 - 5.2 mmol/L 4.3 5.0 4.4  Chloride 96 - 106 mmol/L 100 101 103  CO2 20 - 29 mmol/L 23 21 22   Calcium 8.7 - 10.3 mg/dL 10.2 10.0 9.9  Total Protein 6.0 - 8.5 g/dL - 6.8 7.2  Total Bilirubin 0.0 - 1.2 mg/dL - 0.3 <0.2  Alkaline Phos 44 - 121 IU/L - 88 98  AST 0 - 40 IU/L - 17 24  ALT 0 - 32 IU/L - 15 14   Lipid Panel     Component Value Date/Time   CHOL 180 10/28/2020 1032   TRIG 165 (H) 10/28/2020 1032   HDL 62 10/28/2020 1032   CHOLHDL 2.9 10/28/2020 1032   LDLCALC 90 10/28/2020 1032    CBC    Component Value Date/Time   WBC 11.1 (H) 10/28/2020 1032   RBC 3.71 (L) 10/28/2020 1032   HGB 11.5 10/28/2020 1032   HCT 34.1 10/28/2020 1032   PLT 215 10/28/2020 1032   MCV 92 10/28/2020 1032   MCH 31.0 10/28/2020 1032   MCHC 33.7 10/28/2020 1032   RDW 12.3 10/28/2020 1032    ASSESSMENT AND PLAN: 1. Essential hypertension At goal.  She will continue current medications and low-salt diet. - CBC - Comprehensive metabolic panel - amLODipine (NORVASC) 5 MG tablet; Take 1.5 tablets (7.5 mg total) by mouth daily.  Dispense: 135 tablet; Refill: 3 - metoprolol tartrate (LOPRESSOR) 50 MG tablet; TAKE 1 TABLET(50 MG) BY MOUTH DAILY  Dispense: 90 tablet; Refill: 2  2. Primary osteoarthritis of both knees Continue Tylenol as needed.  Prescription given for Voltaren gel.  Taken off meloxicam earlier this year due to it causing some renal insufficiency. - diclofenac Sodium (VOLTAREN) 1 % GEL; Apply 2 g topically 4 (four) times daily.  Dispense: 100 g; Refill: 1  3. Hyperlipidemia, unspecified hyperlipidemia type Continue atorvastatin - Lipid panel  4. Over weight BMI is no longer in the range for obese.  She is  overweight.  Encouraged her to continue to move as much as she can.  5. Iron deficiency anemia, unspecified iron deficiency anemia type Recheck CBC today. - ferrous sulfate (FEROSUL) 325 (65 FE) MG tablet; Take one tab PO Q Mon/Wed and Frid.  Dispense: 90 tablet; Refill: 1  6. Need for immunization against influenza - Flu Vaccine  QUAD 35mo+IM (Fluarix, Fluzone & Alfiuria Quad PF)  7. Need for shingles vaccine Discussed recommendation for shingles vaccine.  She is agreeable to receiving the vaccine.  Prescription given for her to get it at an outside pharmacy.  Advised that the vaccine can cause swelling, pain and redness at the injection site for several days.  Advised that it is a 2 vaccine series.     Patient was given the opportunity to ask questions.  Patient verbalized understanding of the plan and was able to repeat key elements of the plan.   Orders Placed This Encounter  Procedures   Flu Vaccine QUAD 36mo+IM (Fluarix, Fluzone & Alfiuria Quad PF)     Requested Prescriptions    No prescriptions requested or ordered in this encounter    No follow-ups on file.  Jonah Blue, MD, FACP

## 2021-09-12 ENCOUNTER — Telehealth: Payer: Self-pay

## 2021-09-12 LAB — COMPREHENSIVE METABOLIC PANEL
ALT: 12 IU/L (ref 0–32)
AST: 16 IU/L (ref 0–40)
Albumin/Globulin Ratio: 1.8 (ref 1.2–2.2)
Albumin: 4.8 g/dL — ABNORMAL HIGH (ref 3.7–4.7)
Alkaline Phosphatase: 92 IU/L (ref 44–121)
BUN/Creatinine Ratio: 19 (ref 12–28)
BUN: 16 mg/dL (ref 8–27)
Bilirubin Total: <0.2 mg/dL (ref 0.0–1.2)
CO2: 22 mmol/L (ref 20–29)
Calcium: 9.7 mg/dL (ref 8.7–10.3)
Chloride: 99 mmol/L (ref 96–106)
Creatinine, Ser: 0.85 mg/dL (ref 0.57–1.00)
Globulin, Total: 2.6 g/dL (ref 1.5–4.5)
Glucose: 98 mg/dL (ref 70–99)
Potassium: 4.3 mmol/L (ref 3.5–5.2)
Sodium: 137 mmol/L (ref 134–144)
Total Protein: 7.4 g/dL (ref 6.0–8.5)
eGFR: 72 mL/min/{1.73_m2} (ref >59)

## 2021-09-12 LAB — CBC
Hematocrit: 38.3 % (ref 34.0–46.6)
Hemoglobin: 13 g/dL (ref 11.1–15.9)
MCH: 31 pg (ref 26.6–33.0)
MCHC: 33.9 g/dL (ref 31.5–35.7)
MCV: 91 fL (ref 79–97)
Platelets: 219 10*3/uL (ref 150–450)
RBC: 4.2 x10E6/uL (ref 3.77–5.28)
RDW: 12.2 % (ref 11.7–15.4)
WBC: 12.2 10*3/uL — ABNORMAL HIGH (ref 3.4–10.8)

## 2021-09-12 LAB — LIPID PANEL
Chol/HDL Ratio: 3.1 ratio (ref 0.0–4.4)
Cholesterol, Total: 184 mg/dL (ref 100–199)
HDL: 59 mg/dL (ref >39)
LDL Chol Calc (NIH): 99 mg/dL (ref 0–99)
Triglycerides: 151 mg/dL — ABNORMAL HIGH (ref 0–149)
VLDL Cholesterol Cal: 26 mg/dL (ref 5–40)

## 2021-09-12 NOTE — Telephone Encounter (Signed)
Contacted pt to go over lab results spoke with pt daughter and provided lab results. Pt daughter doesn't have any questions or concerns  °

## 2021-09-12 NOTE — Progress Notes (Signed)
Let patient and her son know that her blood cell count is normal except for mild elevation in her white blood cell count.  We will plan to recheck this for next visit.  Kidney and liver function test are good.  Cholesterol levels good.

## 2021-11-22 ENCOUNTER — Other Ambulatory Visit: Payer: Self-pay | Admitting: Internal Medicine

## 2021-11-22 DIAGNOSIS — I10 Essential (primary) hypertension: Secondary | ICD-10-CM

## 2021-11-24 NOTE — Telephone Encounter (Signed)
Requested Prescriptions  Pending Prescriptions Disp Refills   furosemide (LASIX) 20 MG tablet [Pharmacy Med Name: FUROSEMIDE 20MG  TABLETS] 30 tablet 1    Sig: TAKE 1 TABLET(20 MG) BY MOUTH DAILY     Cardiovascular:  Diuretics - Loop Failed - 11/22/2021  7:06 AM      Failed - Mg Level in normal range and within 180 days    No results found for: MG       Passed - K in normal range and within 180 days    Potassium  Date Value Ref Range Status  09/11/2021 4.3 3.5 - 5.2 mmol/L Final         Passed - Ca in normal range and within 180 days    Calcium  Date Value Ref Range Status  09/11/2021 9.7 8.7 - 10.3 mg/dL Final         Passed - Na in normal range and within 180 days    Sodium  Date Value Ref Range Status  09/11/2021 137 134 - 144 mmol/L Final         Passed - Cr in normal range and within 180 days    Creatinine, Ser  Date Value Ref Range Status  09/11/2021 0.85 0.57 - 1.00 mg/dL Final         Passed - Cl in normal range and within 180 days    Chloride  Date Value Ref Range Status  09/11/2021 99 96 - 106 mmol/L Final         Passed - Last BP in normal range    BP Readings from Last 1 Encounters:  09/11/21 119/72         Passed - Valid encounter within last 6 months    Recent Outpatient Visits          2 months ago Essential hypertension   [facility] 09/13/21, MD   9 months ago Essential hypertension   [facility] Marcine Matar, MD   1 year ago Essential hypertension   [facility] Marcine Matar, MD   1 year ago Need for influenza vaccination   [facility], Lois Huxley, RPH-CPP   1 year ago Essential hypertension   [facility] Cornelius Moras, MD      Future Appointments            In 1 month Marcine Matar Laural Benes, MD [facility]

## 2021-12-17 ENCOUNTER — Other Ambulatory Visit: Payer: Self-pay | Admitting: Internal Medicine

## 2021-12-17 DIAGNOSIS — Z1231 Encounter for screening mammogram for malignant neoplasm of breast: Secondary | ICD-10-CM

## 2021-12-24 ENCOUNTER — Ambulatory Visit
Admission: RE | Admit: 2021-12-24 | Discharge: 2021-12-24 | Disposition: A | Discharge status: Home / Self Care | Payer: Medicaid Other | Source: Ambulatory Visit | Attending: Internal Medicine | Admitting: Internal Medicine

## 2021-12-24 DIAGNOSIS — Z1231 Encounter for screening mammogram for malignant neoplasm of breast: Secondary | ICD-10-CM

## 2022-01-01 ENCOUNTER — Ambulatory Visit: Payer: Medicaid Other | Attending: Internal Medicine | Admitting: Physician Assistant

## 2022-01-01 ENCOUNTER — Encounter: Payer: Self-pay | Admitting: Physician Assistant

## 2022-01-01 VITALS — BP 150/83 | HR 94 | Resp 16 | Wt 164.2 lb

## 2022-01-01 DIAGNOSIS — M170 Bilateral primary osteoarthritis of knee: Secondary | ICD-10-CM | POA: Diagnosis not present

## 2022-01-01 DIAGNOSIS — G5602 Carpal tunnel syndrome, left upper limb: Secondary | ICD-10-CM | POA: Diagnosis not present

## 2022-01-01 DIAGNOSIS — E785 Hyperlipidemia, unspecified: Secondary | ICD-10-CM

## 2022-01-01 DIAGNOSIS — I10 Essential (primary) hypertension: Secondary | ICD-10-CM | POA: Diagnosis not present

## 2022-01-01 DIAGNOSIS — D509 Iron deficiency anemia, unspecified: Secondary | ICD-10-CM | POA: Diagnosis not present

## 2022-01-01 MED ORDER — FUROSEMIDE 20 MG PO TABS: ORAL_TABLET | ORAL | 1 refills | Status: DC

## 2022-01-01 MED ORDER — ATORVASTATIN CALCIUM 20 MG PO TABS: ORAL_TABLET | ORAL | 1 refills | Status: DC

## 2022-01-01 MED ORDER — AMLODIPINE BESYLATE 5 MG PO TABS: 7.5000 mg | ORAL_TABLET | Freq: Every day | ORAL | 3 refills | Status: DC

## 2022-01-01 MED ORDER — DICLOFENAC SODIUM 1 % EX GEL: 2.0000 g | Freq: Four times a day (QID) | CUTANEOUS | 1 refills | Status: DC

## 2022-01-01 MED ORDER — METOPROLOL TARTRATE 50 MG PO TABS: ORAL_TABLET | ORAL | 1 refills | Status: DC

## 2022-01-01 MED ORDER — FERROUS SULFATE 325 (65 FE) MG PO TABS: ORAL_TABLET | ORAL | 1 refills | Status: DC

## 2022-01-01 NOTE — Patient Instructions (Signed)
Carpal Tunnel Syndrome Carpal tunnel syndrome is a condition that causes pain, weakness, and numbness in your hand and arm. Numbness is when you cannot feel an area in your body. The carpal tunnel is a narrow area that is on the palm side of your wrist. Repeated wrist motion or certain diseases may cause swelling in the tunnel. This swelling can pinch the main nerve in the wrist. This nerve is called the median nerve. What are the causes? This condition may be caused by: Moving your hand and wrist over and over again while doing a task. Injury to the wrist. Arthritis. A sac of fluid (cyst) or abnormal growth (tumor) in the carpal tunnel. Fluid buildup during pregnancy. Use of tools that vibrate. Sometimes the cause is not known. What increases the risk? The following factors may make you more likely to have this condition: Having a job that makes you do these things: Move your hand over and over again. Work with tools that vibrate, such as drills or sanders. Being a woman. Having diabetes, obesity, thyroid problems, or kidney failure. What are the signs or symptoms? Symptoms of this condition include: A tingling feeling in your fingers. Tingling or loss of feeling in your hand. Pain in your entire arm. This pain may get worse when you bend your wrist and elbow for a long time. Pain in your wrist that goes up your arm to your shoulder. Pain that goes down into your palm or fingers. Weakness in your hands. You may find it hard to grab and hold items. You may feel worse at night. How is this treated? This condition may be treated with: Lifestyle changes. You will be asked to stop or change the activity that caused your problem. Doing exercises and activities that make bones, muscles, and tendons stronger (physical therapy). Learning how to use your hand again (occupational therapy). Medicines for pain and swelling. You may have injections in your wrist. A wrist splint or  brace. Surgery. Follow these instructions at home: If you have a splint or brace: Wear the splint or brace as told by your doctor. Take it off only as told by your doctor. Loosen the splint if your fingers: Tingle. Become numb. Turn cold and blue. Keep the splint or brace clean. If the splint or brace is not waterproof: Do not let it get wet. Cover it with a watertight covering when you take a bath or a shower. Managing pain, stiffness, and swelling If told, put ice on the painful area: If you have a removable splint or brace, remove it as told by your doctor. Put ice in a plastic bag. Place a towel between your skin and the bag. Leave the ice on for 20 minutes, 2-3 times per day. Do not fall asleep with the cold pack on your skin. Take off the ice if your skin turns bright red. This is very important. If you cannot feel pain, heat, or cold, you have a greater risk of damage to the area. Move your fingers often to reduce stiffness and swelling. General instructions Take over-the-counter and prescription medicines only as told by your doctor. Rest your wrist from any activity that may cause pain. If needed, talk with your boss at work about changes that can help your wrist heal. Do exercises as told by your doctor, physical therapist, or occupational therapist. Keep all follow-up visits. Contact a doctor if: You have new symptoms. Medicine does not help your pain. Your symptoms get worse. Get help right away   if: You have very bad numbness or tingling in your wrist or hand. Summary Carpal tunnel syndrome is a condition that causes pain in your hand and arm. It is often caused by repeated wrist motions. Lifestyle changes and medicines are used to treat this problem. Surgery may help in very bad cases. Follow your doctor's instructions about wearing a splint, resting your wrist, keeping follow-up visits, and calling for help. This information is not intended to replace advice given  to you by your health care provider. Make sure you discuss any questions you have with your health care provider. Document Revised: 01/25/2020 Document Reviewed: 01/25/2020 Elsevier Patient Education  2022 Elsevier Inc.  

## 2022-01-01 NOTE — Progress Notes (Signed)
? ?Katie Burns, is a 76 y.o. adult ? ?[id number] ? ?[id number] ? ?DOB - Jun 03, 1946 ? ?Chief Complaint  ?Patient presents with  ? Hypertension  ? Knee Pain  ?  B/l  ? Shoulder Pain  ?  B/l  ?    ? ?Subjective:  ? ?Katie Burns is a 76 y.o. adult here today [place]  She is going to be leaving April 10th.  She did not take amlodipine today.  Her son is here translating for her.   ? ?Her L hand has been causing her pain and waking her up from sleep with numbness of thumb and first 2 fingers.  This has been going on for a long times.  Seems to be worsening over last 6 months.   ? ?No HA/CP/SOB/Dizziness ? ?No problems updated. ? ?ALLERGIES: ?No Known Allergies ? ?PAST MEDICAL HISTORY: ?Past Medical History:  ?Diagnosis Date  ? Hyperlipidemia   ? Hypertension   ? ? ?MEDICATIONS AT HOME: ?Prior to Admission medications   ?Medication Sig Start Date End Date Taking? Authorizing Provider  ?amLODipine (NORVASC) 5 MG tablet Take 1.5 tablets (7.5 mg total) by mouth daily. 01/01/22   Anders Simmonds, PA-C  ?atorvastatin (LIPITOR) 20 MG tablet TAKE 1 TABLET(20 MG) BY MOUTH DAILY 01/01/22   Anders Simmonds, PA-C  ?Blood Pressure Monitor DEVI Use as directed to check home blood pressure 2-3 times a week 02/16/19   Marcine Matar, MD  ?diclofenac Sodium (VOLTAREN) 1 % GEL Apply 2 g topically 4 (four) times daily. 01/01/22   Anders Simmonds, PA-C  ?ferrous sulfate (FEROSUL) 325 (65 FE) MG tablet Take one tab PO Q Mon/Wed and Frid. 01/01/22   Anders Simmonds, PA-C  ?furosemide (LASIX) 20 MG tablet TAKE 1 TABLET(20 MG) BY MOUTH DAILY 01/01/22   Anders Simmonds, PA-C  ?metoprolol tartrate (LOPRESSOR) 50 MG tablet TAKE 1 TABLET(50 MG) BY MOUTH DAILY 01/01/22   Anders Simmonds, PA-C  ? ? ?ROS: ?Neg HEENT ?Neg resp ?Neg cardiac ?Neg GI ?Neg GU ?Neg psych ?Neg neuro ? ?Objective:  ? ?Vitals:  ? 01/01/22 1557  ?BP: (!) 150/83  ?Pulse: 94  ?Resp: 16  ?SpO2: 95%  ?Weight: 164 lb 3.2 oz (74.5 kg)   ? ?Exam ?General appearance : Awake, alert, not in any distress. Speech Clear. Not toxic looking ?HEENT: Atraumatic and Normocephalic ?Neck: Supple, no JVD. No cervical lymphadenopathy.  ?Chest: Good air entry bilaterally, CTAB.  No rales/rhonchi/wheezing ?CVS: S1 S2 regular, no murmurs.  ?+phalen's L hand.  Neg tinels.  Grip strength and ROM WNL B ?Extremities: B/L Lower Ext shows no edema, both legs are warm to touch ?Neurology: Awake alert, and oriented X 3, CN II-XII intact, Non focal ?Skin: No Rash ? ?Data Review ?Lab Results  ?Component Value Date  ? HGBA1C 5.9 (H) 10/28/2020  ? HGBA1C 5.7 (H) 11/14/2019  ? ? ?Assessment & Plan  ? ?1. Essential hypertension ?Continue current regimen.   ?- amLODipine (NORVASC) 5 MG tablet; Take 1.5 tablets (7.5 mg total) by mouth daily.  Dispense: 135 tablet; Refill: 3 ?- furosemide (LASIX) 20 MG tablet; TAKE 1 TABLET(20 MG) BY MOUTH DAILY  Dispense: 90 tablet; Refill: 1 ?- metoprolol tartrate (LOPRESSOR) 50 MG tablet; TAKE 1 TABLET(50 MG) BY MOUTH DAILY  Dispense: 270 tablet; Refill: 1 ? ?2. Hyperlipidemia, unspecified hyperlipidemia type ?Labs from december reviewed ?- atorvastatin (LIPITOR) 20 MG tablet; TAKE 1 TABLET(20 MG) BY MOUTH DAILY  Dispense: 90  tablet; Refill: 1 ? ?3. Carpal tunnel syndrome of left wrist ?Obtain wrist splint-showed images and sleep in every night ?- diclofenac Sodium (VOLTAREN) 1 % GEL; Apply 2 g topically 4 (four) times daily.  Dispense: 350 g; Refill: 1 ? ?4. Iron deficiency anemia, unspecified iron deficiency anemia type ?- ferrous sulfate (FEROSUL) 325 (65 FE) MG tablet; Take one tab PO Q Mon/Wed and Frid.  Dispense: 90 tablet; Refill: 1 ? ?5. Primary osteoarthritis of both knees ?- diclofenac Sodium (VOLTAREN) 1 % GEL; Apply 2 g topically 4 (four) times daily.  Dispense: 350 g; Refill: 1 ? ?Patient seen for Dr Laural Benes as she was running behind schedule ? ?Patient have been counseled extensively about nutrition and exercise. Other issues  discussed during this visit include: low cholesterol diet, weight control and daily exercise, foot care, annual eye examinations at Ophthalmology, importance of adherence with medications and regular follow-up. We also discussed long term complications of uncontrolled diabetes and hypertension.  ? ?Return for 4 to 6 months for chronic conditions. ? ?The patient was given clear instructions to go to ER or return to medical center if symptoms don't improve, worsen or new problems develop. The patient verbalized understanding. The patient was told to call to get lab results if they haven't heard anything in the next week.  ? ? ? ? ?Georgian Co, PA-C ?[facility] ?[place], [place] ?[phone]   ?01/01/2022, 4:48 PM Patient ID: Katie Burns, adult   DOB: 05-10-1946, 76 y.o.   MRN: [id number] ? ?

## 2022-08-06 ENCOUNTER — Encounter: Payer: Self-pay | Admitting: Physician Assistant

## 2022-08-06 ENCOUNTER — Ambulatory Visit: Payer: Medicaid Other | Attending: Physician Assistant | Admitting: Physician Assistant

## 2022-08-06 VITALS — BP 133/81 | HR 81 | Wt 169.6 lb

## 2022-08-06 DIAGNOSIS — R7303 Prediabetes: Secondary | ICD-10-CM | POA: Diagnosis not present

## 2022-08-06 DIAGNOSIS — Z23 Encounter for immunization: Secondary | ICD-10-CM | POA: Diagnosis not present

## 2022-08-06 DIAGNOSIS — D509 Iron deficiency anemia, unspecified: Secondary | ICD-10-CM

## 2022-08-06 DIAGNOSIS — E785 Hyperlipidemia, unspecified: Secondary | ICD-10-CM

## 2022-08-06 DIAGNOSIS — I10 Essential (primary) hypertension: Secondary | ICD-10-CM | POA: Diagnosis not present

## 2022-08-06 DIAGNOSIS — M170 Bilateral primary osteoarthritis of knee: Secondary | ICD-10-CM

## 2022-08-06 DIAGNOSIS — Z789 Other specified health status: Secondary | ICD-10-CM

## 2022-08-06 DIAGNOSIS — Z758 Other problems related to medical facilities and other health care: Secondary | ICD-10-CM

## 2022-08-06 MED ORDER — FERROUS SULFATE 325 (65 FE) MG PO TABS: ORAL_TABLET | ORAL | 1 refills | Status: AC

## 2022-08-06 MED ORDER — DICLOFENAC SODIUM 1 % EX GEL: 2.0000 g | Freq: Four times a day (QID) | CUTANEOUS | 1 refills | Status: DC

## 2022-08-06 MED ORDER — AMLODIPINE BESYLATE 5 MG PO TABS: 7.5000 mg | ORAL_TABLET | Freq: Every day | ORAL | 3 refills | Status: DC

## 2022-08-06 MED ORDER — FUROSEMIDE 20 MG PO TABS: ORAL_TABLET | ORAL | 1 refills | Status: DC

## 2022-08-06 MED ORDER — ATORVASTATIN CALCIUM 20 MG PO TABS: ORAL_TABLET | ORAL | 1 refills | Status: DC

## 2022-08-06 MED ORDER — MELOXICAM 7.5 MG PO TABS: 7.5000 mg | ORAL_TABLET | Freq: Every day | ORAL | 1 refills | Status: DC

## 2022-08-06 MED ORDER — METOPROLOL TARTRATE 50 MG PO TABS: ORAL_TABLET | ORAL | 1 refills | Status: DC

## 2022-08-06 NOTE — Progress Notes (Signed)
Patient ID: Katie Burns, adult   DOB: October 17, 1945, 76 y.o.   MRN: [id number]   Sema Stangler, is a 76 y.o. adult  [id number]  [id number]  DOB - Dec 31, 1945  Chief Complaint  Patient presents with   Medication Refill   Knee Pain       Subjective:   Katie Burns is a 76 y.o. adult here today for a follow up visit and to establish care. Patient has No headache, No chest pain, No abdominal pain - No Nausea, No new weakness tingling or numbness, No Cough - SOB.  Onset: Location: Duration: Characterization/quality: Alleviating/aggravating: Radiation: Severity:  No problems updated.  ALLERGIES: No Known Allergies  PAST MEDICAL HISTORY: Past Medical History:  Diagnosis Date   Hyperlipidemia    Hypertension     MEDICATIONS AT HOME: Prior to Admission medications   Medication Sig Start Date End Date Taking? Authorizing Provider  Blood Pressure Monitor DEVI Use as directed to check home blood pressure 2-3 times a week 02/16/19  Yes Marcine Matar, MD  meloxicam (MOBIC) 7.5 MG tablet Take 1 tablet (7.5 mg total) by mouth daily. Prn pain 08/06/22  Yes Anders Simmonds, PA-C  amLODipine (NORVASC) 5 MG tablet Take 1.5 tablets (7.5 mg total) by mouth daily. 08/06/22   Anders Simmonds, PA-C  atorvastatin (LIPITOR) 20 MG tablet TAKE 1 TABLET(20 MG) BY MOUTH DAILY 08/06/22   Georgian Co M, PA-C  diclofenac Sodium (VOLTAREN) 1 % GEL Apply 2 g topically 4 (four) times daily. 08/06/22   Anders Simmonds, PA-C  ferrous sulfate (FEROSUL) 325 (65 FE) MG tablet Take one tab PO Q Mon/Wed and Frid. 08/06/22   Anders Simmonds, PA-C  furosemide (LASIX) 20 MG tablet TAKE 1 TABLET(20 MG) BY MOUTH DAILY 08/06/22   Georgian Co M, PA-C  metoprolol tartrate (LOPRESSOR) 50 MG tablet TAKE 1 TABLET(50 MG) BY MOUTH DAILY 08/06/22   Copper Basnett, Marzella Schlein, PA-C    ROS: Neg HEENT Neg resp Neg cardiac Neg GI Neg GU Neg MS Neg psych Neg neuro  Objective:   Vitals:   08/06/22 1400  BP:  133/81  Pulse: 81  SpO2: 97%  Weight: 169 lb 9.6 oz (76.9 kg)   Exam General appearance : Awake, alert, not in any distress. Speech Clear. Not toxic looking HEENT: Atraumatic and Normocephalic Neck: Supple, no JVD. No cervical lymphadenopathy.  Chest: Good air entry bilaterally, CTAB.  No rales/rhonchi/wheezing CVS: S1 S2 regular, no murmurs.  B knees with crepitus on extension.  No ballotment effusion or erythema.  Ligaments stable Extremities: B/L Lower Ext shows no edema, both legs are warm to touch Neurology: Awake alert, and oriented X 3, CN II-XII intact, Non focal Skin: No Rash  Data Review Lab Results  Component Value Date   HGBA1C 5.9 (H) 10/28/2020   HGBA1C 5.7 (H) 11/14/2019    Assessment & Plan   1. Essential hypertension stable - Comprehensive metabolic panel - metoprolol tartrate (LOPRESSOR) 50 MG tablet; TAKE 1 TABLET(50 MG) BY MOUTH DAILY  Dispense: 270 tablet; Refill: 1 - furosemide (LASIX) 20 MG tablet; TAKE 1 TABLET(20 MG) BY MOUTH DAILY  Dispense: 90 tablet; Refill: 1 - amLODipine (NORVASC) 5 MG tablet; Take 1.5 tablets (7.5 mg total) by mouth daily.  Dispense: 135 tablet; Refill: 3  2. Hyperlipidemia, unspecified hyperlipidemia type - Comprehensive metabolic panel - Lipid panel - atorvastatin (LIPITOR) 20 MG tablet; TAKE 1 TABLET(20 MG) BY MOUTH DAILY  Dispense: 90 tablet; Refill: 1  3. Iron deficiency anemia, unspecified  iron deficiency anemia type - CBC with Differential/Platelet - ferrous sulfate (FEROSUL) 325 (65 FE) MG tablet; Take one tab PO Q Mon/Wed and Frid.  Dispense: 90 tablet; Refill: 1  4. Prediabetes - Hemoglobin A1c  5. Primary osteoarthritis of both knees - Ambulatory referral to Orthopedic Surgery - diclofenac Sodium (VOLTAREN) 1 % GEL; Apply 2 g topically 4 (four) times daily.  Dispense: 350 g; Refill: 1 - meloxicam (MOBIC) 7.5 MG tablet; Take 1 tablet (7.5 mg total) by mouth daily. Prn pain  Dispense: 30 tablet; Refill: 1  6.  Need for immunization against influenza Flu shot given  7. Language barrier Son interpreted used and additional time performing visit was required.     Return in about 6 months (around 02/04/2023) for PCP for chronic conditions.  The patient was given clear instructions to go to ER or return to medical center if symptoms don't improve, worsen or new problems develop. The patient verbalized understanding. The patient was told to call to get lab results if they haven't heard anything in the next week.      Georgian Co, PA-C [facility] [place], [place] [phone]   08/06/2022, 2:12 PM

## 2022-08-07 LAB — COMPREHENSIVE METABOLIC PANEL
ALT: 8 IU/L (ref 0–32)
AST: 17 IU/L (ref 0–40)
Albumin/Globulin Ratio: 1.6 (ref 1.2–2.2)
Albumin: 4.5 g/dL (ref 3.8–4.8)
Alkaline Phosphatase: 92 IU/L (ref 44–121)
BUN/Creatinine Ratio: 25 (ref 12–28)
BUN: 29 mg/dL — ABNORMAL HIGH (ref 8–27)
Bilirubin Total: <0.2 mg/dL (ref 0.0–1.2)
CO2: 21 mmol/L (ref 20–29)
Calcium: 10.1 mg/dL (ref 8.7–10.3)
Chloride: 102 mmol/L (ref 96–106)
Creatinine, Ser: 1.15 mg/dL — ABNORMAL HIGH (ref 0.57–1.00)
Globulin, Total: 2.8 g/dL (ref 1.5–4.5)
Glucose: 119 mg/dL — ABNORMAL HIGH (ref 70–99)
Potassium: 4.8 mmol/L (ref 3.5–5.2)
Sodium: 139 mmol/L (ref 134–144)
Total Protein: 7.3 g/dL (ref 6.0–8.5)
eGFR: 50 mL/min/{1.73_m2} — ABNORMAL LOW (ref >59)

## 2022-08-07 LAB — LIPID PANEL
Chol/HDL Ratio: 3.4 ratio (ref 0.0–4.4)
Cholesterol, Total: 191 mg/dL (ref 100–199)
HDL: 57 mg/dL (ref >39)
LDL Chol Calc (NIH): 93 mg/dL (ref 0–99)
Triglycerides: 248 mg/dL — ABNORMAL HIGH (ref 0–149)
VLDL Cholesterol Cal: 41 mg/dL — ABNORMAL HIGH (ref 5–40)

## 2022-08-07 LAB — HEMOGLOBIN A1C
Est. average glucose Bld gHb Est-mCnc: 117 mg/dL
Hgb A1c MFr Bld: 5.7 % — ABNORMAL HIGH (ref 4.8–5.6)

## 2022-08-07 LAB — CBC WITH DIFFERENTIAL/PLATELET
Basophils Absolute: 0.1 10*3/uL (ref 0.0–0.2)
Basos: 1 %
EOS (ABSOLUTE): 0.3 10*3/uL (ref 0.0–0.4)
Eos: 2 %
Hematocrit: 36.6 % (ref 34.0–46.6)
Hemoglobin: 12 g/dL (ref 11.1–15.9)
Immature Grans (Abs): 0 10*3/uL (ref 0.0–0.1)
Immature Granulocytes: 0 %
Lymphocytes Absolute: 4.1 10*3/uL — ABNORMAL HIGH (ref 0.7–3.1)
Lymphs: 36 %
MCH: 30.9 pg (ref 26.6–33.0)
MCHC: 32.8 g/dL (ref 31.5–35.7)
MCV: 94 fL (ref 79–97)
Monocytes Absolute: 1 10*3/uL — ABNORMAL HIGH (ref 0.1–0.9)
Monocytes: 9 %
Neutrophils Absolute: 5.9 10*3/uL (ref 1.4–7.0)
Neutrophils: 52 %
Platelets: 195 10*3/uL (ref 150–450)
RBC: 3.88 x10E6/uL (ref 3.77–5.28)
RDW: 11.7 % (ref 11.7–15.4)
WBC: 11.4 10*3/uL — ABNORMAL HIGH (ref 3.4–10.8)

## 2022-08-18 ENCOUNTER — Ambulatory Visit: Payer: Medicaid Other | Admitting: Orthopaedic Surgery

## 2022-08-19 ENCOUNTER — Ambulatory Visit (INDEPENDENT_AMBULATORY_CARE_PROVIDER_SITE_OTHER): Payer: Medicare Other | Admitting: Orthopaedic Surgery

## 2022-08-19 ENCOUNTER — Ambulatory Visit: Payer: Self-pay

## 2022-08-19 ENCOUNTER — Ambulatory Visit (INDEPENDENT_AMBULATORY_CARE_PROVIDER_SITE_OTHER): Payer: Medicare Other

## 2022-08-19 DIAGNOSIS — M1712 Unilateral primary osteoarthritis, left knee: Secondary | ICD-10-CM | POA: Diagnosis not present

## 2022-08-19 DIAGNOSIS — M1711 Unilateral primary osteoarthritis, right knee: Secondary | ICD-10-CM

## 2022-08-19 NOTE — Progress Notes (Signed)
Office Visit Note   Patient: Katie Burns           Date of Birth: [birth year]           MRN: [id number] Visit Date: 08/19/2022              Requested by: Anders Simmonds, PA-C [address] [place],  [place] [address] PCP: Marcine Matar, MD   Assessment & Plan: Visit Diagnoses:  1. Primary osteoarthritis of right knee   2. Primary osteoarthritis of left knee     Plan: Impression is severe bilateral knee degenerative joint disease secondary to Osteoarthritis.  Bone on bone joint space narrowing is seen on radiographs with significant varus alignment.  At this point, conservative treatments fail to provide any significant relief and the pain is severely affecting ADLs and quality of life.  Based on treatment options, the patient has elected to move forward with a knee replacement.  We have discussed the surgical risks that include but are not limited to infection, DVT, leg length discrepancy, stiffness, numbness, tingling, incomplete relief of pain.  Recovery and prognosis were also reviewed.    Patient would like to do the right knee replacement first.    Anticoagulants: No antithrombotic Postop anticoagulation: Aspirin 81 mg Nickel allergy: No Prior DVT/PE: No Tobacco use: No Clearances needed for surgery: Georgian Co, PA Anticipated discharge dispo: home with daughter and son in law   Follow-Up Instructions: No follow-ups on file.   Orders:  Orders Placed This Encounter  Procedures   XR KNEE 3 VIEW RIGHT   XR KNEE 3 VIEW LEFT   No orders of the defined types were placed in this encounter.     Procedures: No procedures performed   Clinical Data: No additional findings.   Subjective: Chief Complaint  Patient presents with   Right Knee - Pain   Left Knee - Pain    HPI Patient is a 76 year old [place] female who comes in with her son-in-law for chronic bilateral knee pain that has gotten worse.  I saw her about 4 years ago and at that time  she had bone-on-bone changes and at that time she was not interested in cortisone injections or surgery.  Since that time her symptoms have gotten worse.  She now falls frequently and her knees give out.  She is severely affected by her knees in regards to daily activities and quality of life.  She lives with her daughter and son-in-law.  She only speaks Hindi and her son-in-law is serving as the interpreter.  She has taken over-the-counter medications to help the pain.  Review of Systems  Constitutional: Negative.   HENT: Negative.    Eyes: Negative.   Respiratory: Negative.    Cardiovascular: Negative.   Endocrine: Negative.   Musculoskeletal: Negative.   Neurological: Negative.   Hematological: Negative.   Psychiatric/Behavioral: Negative.    All other systems reviewed and are negative.    Objective: Vital Signs: There were no vitals taken for this visit.  Physical Exam Vitals and nursing note reviewed.  Constitutional:      Appearance: He is well-developed.  HENT:     Head: Atraumatic.     Nose: Nose normal.  Eyes:     Extraocular Movements: Extraocular movements intact.  Cardiovascular:     Pulses: Normal pulses.  Pulmonary:     Effort: Pulmonary effort is normal.  Abdominal:     Palpations: Abdomen is soft.  Musculoskeletal:  Cervical back: Neck supple.  Skin:    General: Skin is warm.     Capillary Refill: Capillary refill takes less than 2 seconds.  Neurological:     Mental Status: He is alert. Mental status is at baseline.  Psychiatric:        Behavior: Behavior normal.        Thought Content: Thought content normal.        Judgment: Judgment normal.     Ortho Exam Examination of bilateral knees show varus deformities.  Mild flexion contracture.  Medial joint line tenderness.  No joint effusion.  2+ crepitus with range of motion.  There is pain with flexion past 45 degrees. Specialty Comments:  No specialty comments available.  Imaging: XR KNEE 3  VIEW LEFT  Result Date: 08/19/2022 Three-view x-rays of the left knee show severe tricompartmental osteoarthritis with varus deformity.  Medial compartment is bone-on-bone.  XR KNEE 3 VIEW RIGHT  Result Date: 08/19/2022 Three-view x-rays of the right knee show significant varus deformity.  Medial compartment is bone-on-bone.    PMFS History: Patient Active Problem List   Diagnosis Date Noted   Prediabetes 11/19/2019   Essential hypertension 10/18/2017   Hyperlipidemia 10/18/2017   Primary osteoarthritis of both knees 10/18/2017   Obesity (BMI 30.0-34.9) 10/18/2017   Past Medical History:  Diagnosis Date   Hyperlipidemia    Hypertension     Family History  Problem Relation Age of Onset   Hypertension Daughter    Breast cancer Neg Hx     Past Surgical History:  Procedure Laterality Date   CATARACT EXTRACTION, BILATERAL Bilateral 2017   with lens implant   EYE SURGERY Bilateral 2015   cataract extractionwith lens implant   Social History   Occupational History   Occupation: unempolyed  Tobacco Use   Smoking status: Never   Smokeless tobacco: Never  Vaping Use   Vaping Use: Never used  Substance and Sexual Activity   Alcohol use: No   Drug use: No   Sexual activity: Not on file

## 2022-09-24 IMAGING — MG MM DIGITAL SCREENING BILAT W/ TOMO AND CAD
8 series · 8 of 24 positions shown · non-contrast
Comparison: Previous exam(s).

CLINICAL DATA: Screening.

EXAM:
DIGITAL SCREENING BILATERAL MAMMOGRAM WITH TOMOSYNTHESIS AND CAD
TECHNIQUE: Bilateral screening digital craniocaudal and mediolateral oblique
mammograms were obtained. Bilateral screening digital breast
tomosynthesis was performed. The images were evaluated with
computer-aided detection.

[L CC synth-2D]
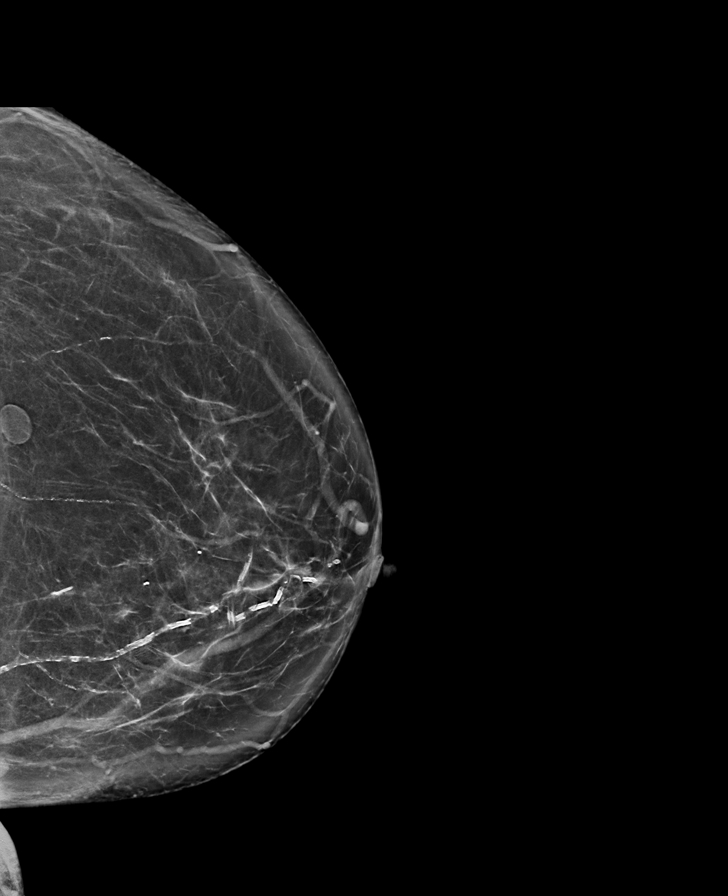

[R MLO synth-2D]
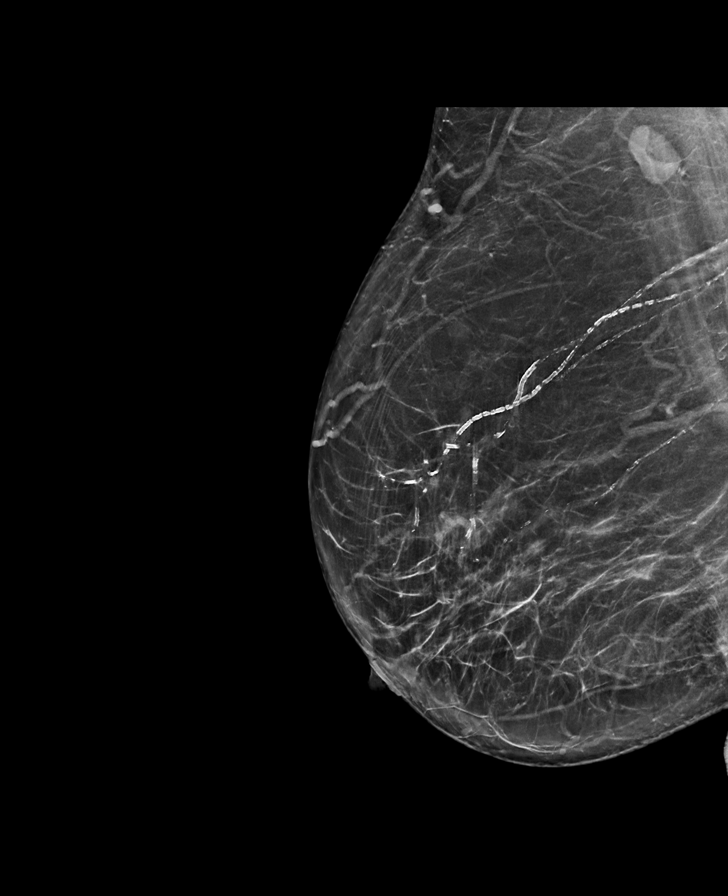

[L MLO synth-2D]
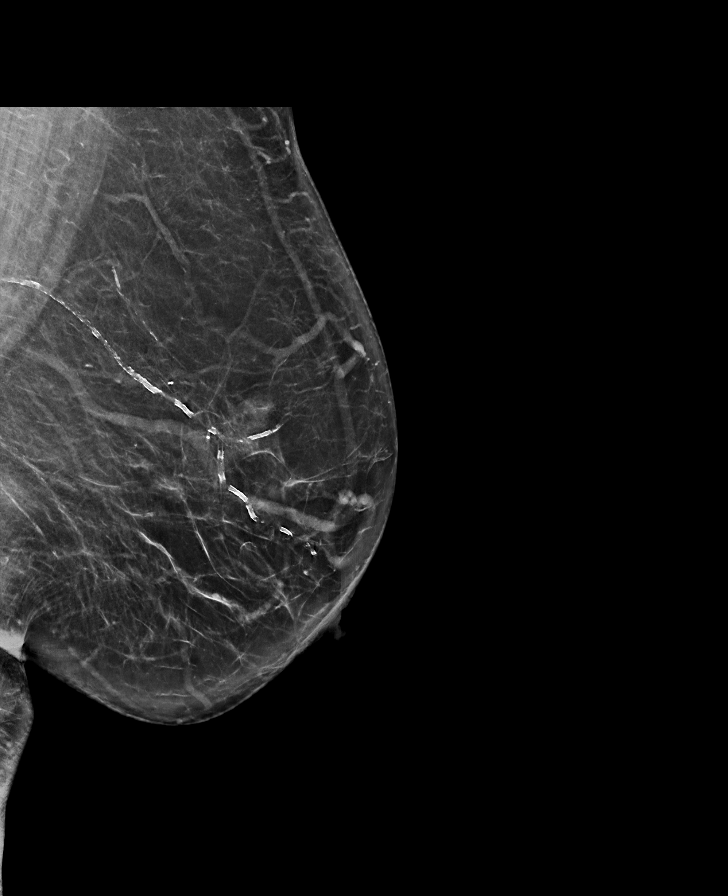

[R CC synth-2D]
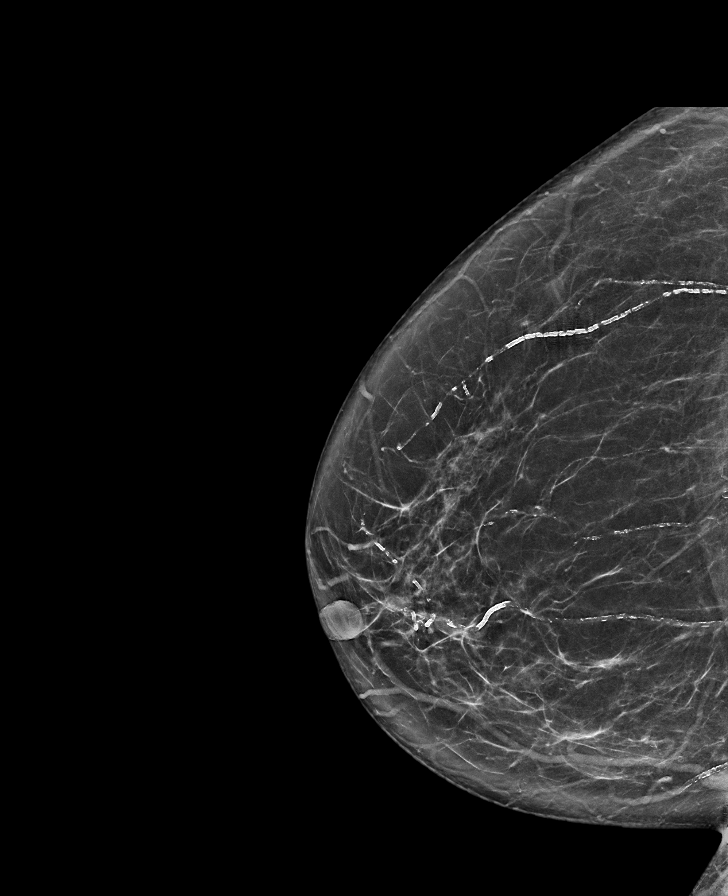

[L MLO tomo · tomo slice 41/81.0]
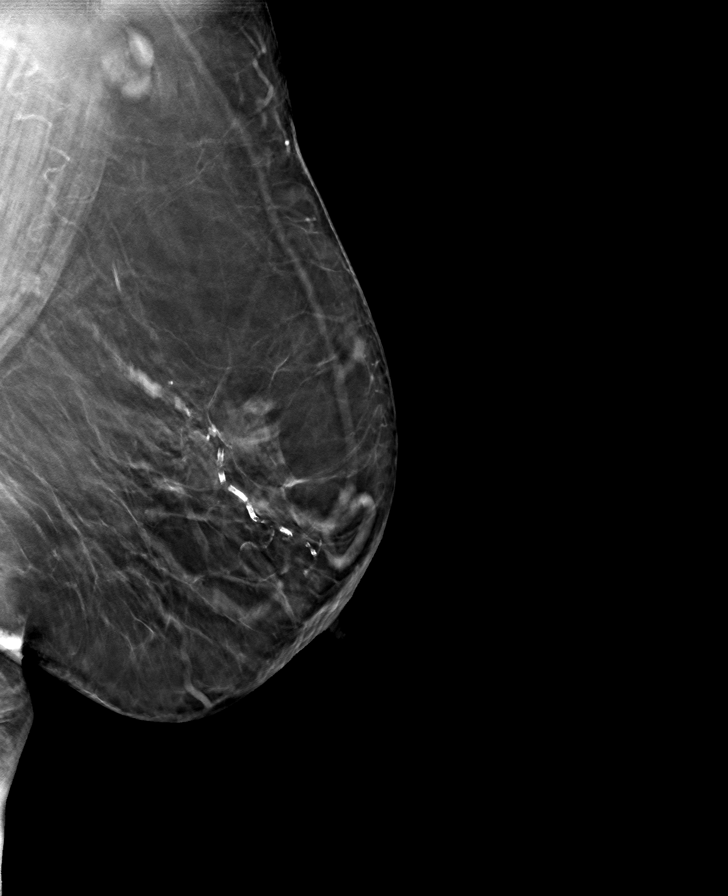

[R CC tomo · tomo slice 39/78.0]
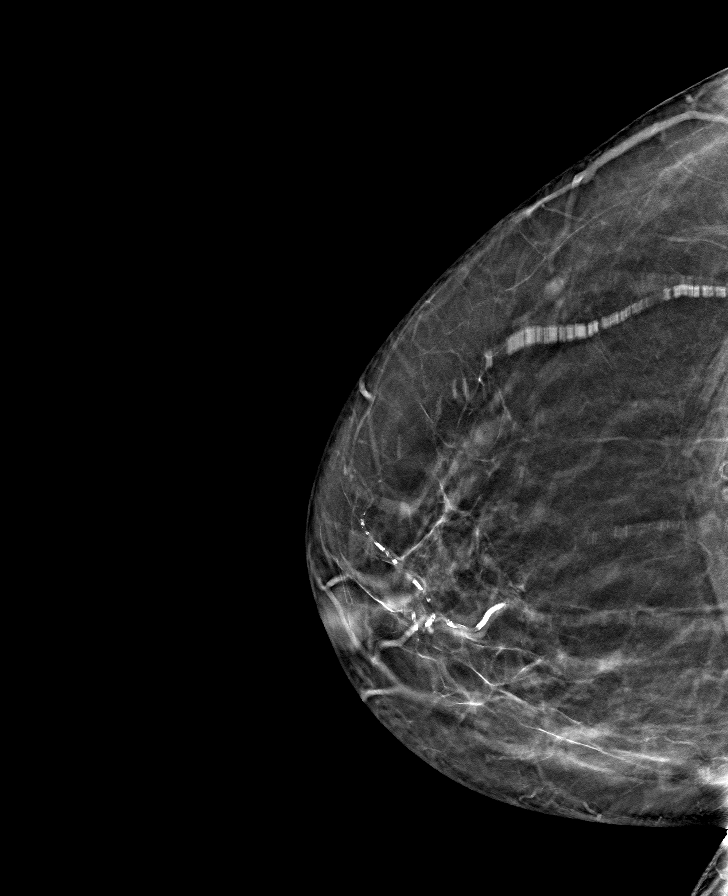

[L CC tomo · tomo slice 39/77.0]
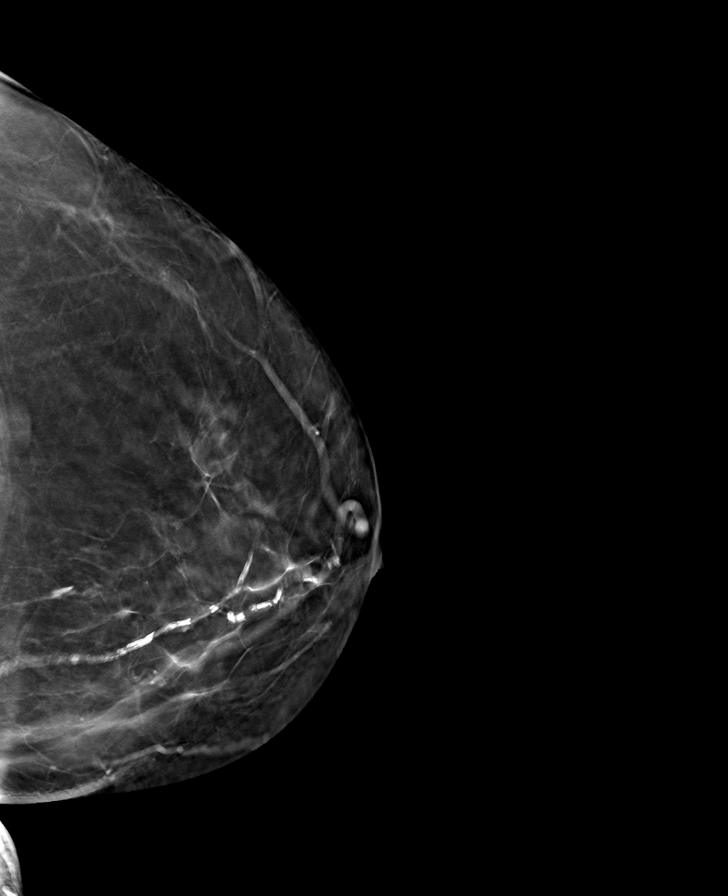

[R MLO tomo · tomo slice 43/85.0]
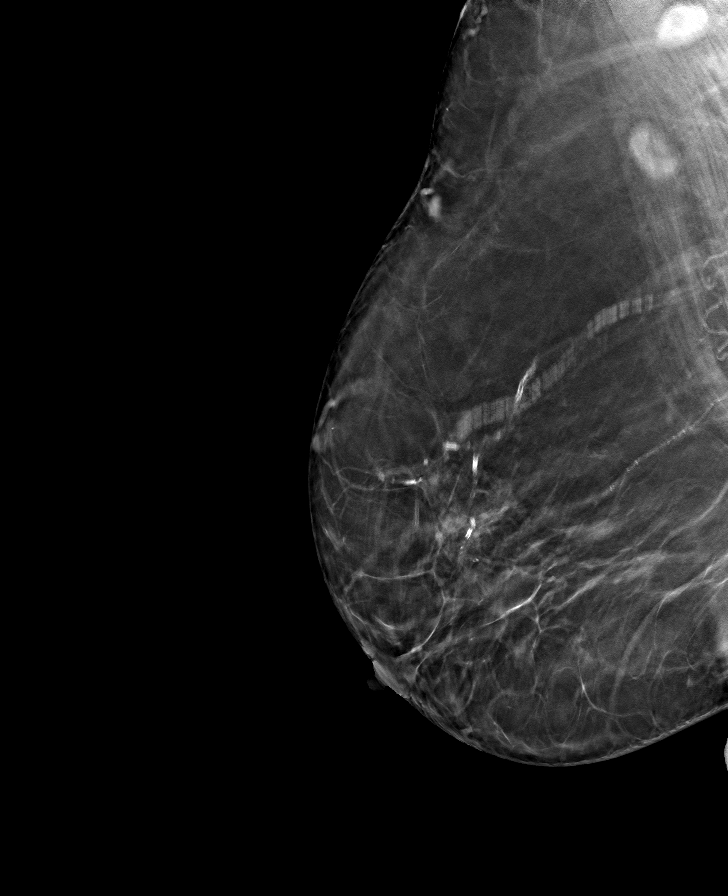

[8 of 24 positions shown; findings below may reference images not displayed]

ACR Breast Density Category b: There are scattered areas of
fibroglandular density.
FINDINGS: There are no findings suspicious for malignancy.
IMPRESSION: No mammographic evidence of malignancy. A result letter of this
screening mammogram will be mailed directly to the patient.

RECOMMENDATION:
Screening mammogram in one year. (Code:51-O-LD2)

BI-RADS CATEGORY  1: Negative.

## 2022-10-15 ENCOUNTER — Telehealth: Payer: Self-pay | Admitting: Emergency Medicine

## 2022-10-15 NOTE — Telephone Encounter (Signed)
Copied from [place] [id number]. Topic: Appointment Scheduling - Scheduling Inquiry for Clinic >> Oct 15, 2022  1:12 PM Penni Bombard wrote: Reason for CRM: Debbie with [facility] Dr. Ephriam Jenkins.  She said the patients presurgical clearance with [facility] is the same day as her surgery is scheduled.  Can the office move her up so she want have to reschedule her surgery appt.  She has been waiting months for the surgery.  CB#  [phone]  Jackelyn Poling

## 2022-10-16 NOTE — Telephone Encounter (Signed)
Pt has been placed on the [facility] schedule for Tuesday.

## 2022-10-16 NOTE — Telephone Encounter (Signed)
Can patient be a double book or placed on the walkin schedule?

## 2022-10-19 ENCOUNTER — Other Ambulatory Visit: Payer: Self-pay | Admitting: Physician Assistant

## 2022-10-19 MED ORDER — METHOCARBAMOL 750 MG PO TABS: 750.0000 mg | ORAL_TABLET | Freq: Two times a day (BID) | ORAL | 2 refills | Status: DC | PRN

## 2022-10-19 MED ORDER — ONDANSETRON HCL 4 MG PO TABS: 4.0000 mg | ORAL_TABLET | Freq: Three times a day (TID) | ORAL | 0 refills | Status: DC | PRN

## 2022-10-19 MED ORDER — ASPIRIN 81 MG PO TBEC: 81.0000 mg | DELAYED_RELEASE_TABLET | Freq: Two times a day (BID) | ORAL | 0 refills | Status: DC

## 2022-10-19 MED ORDER — OXYCODONE-ACETAMINOPHEN 5-325 MG PO TABS: 1.0000 | ORAL_TABLET | Freq: Four times a day (QID) | ORAL | 0 refills | Status: DC | PRN

## 2022-10-19 MED ORDER — DOCUSATE SODIUM 100 MG PO CAPS: 100.0000 mg | ORAL_CAPSULE | Freq: Every day | ORAL | 2 refills | Status: DC | PRN

## 2022-10-20 ENCOUNTER — Ambulatory Visit: Payer: Medicare Other | Attending: Internal Medicine | Admitting: Family Medicine

## 2022-10-20 ENCOUNTER — Encounter: Payer: Self-pay | Admitting: Family Medicine

## 2022-10-20 ENCOUNTER — Other Ambulatory Visit: Payer: Self-pay

## 2022-10-20 VITALS — BP 152/78 | HR 63 | Ht 62.0 in | Wt 168.0 lb

## 2022-10-20 DIAGNOSIS — I440 Atrioventricular block, first degree: Secondary | ICD-10-CM | POA: Diagnosis not present

## 2022-10-20 DIAGNOSIS — Z01818 Encounter for other preprocedural examination: Secondary | ICD-10-CM | POA: Diagnosis present

## 2022-10-20 DIAGNOSIS — E785 Hyperlipidemia, unspecified: Secondary | ICD-10-CM | POA: Insufficient documentation

## 2022-10-20 DIAGNOSIS — Z79899 Other long term (current) drug therapy: Secondary | ICD-10-CM | POA: Diagnosis not present

## 2022-10-20 DIAGNOSIS — M1711 Unilateral primary osteoarthritis, right knee: Secondary | ICD-10-CM | POA: Diagnosis not present

## 2022-10-20 DIAGNOSIS — R7303 Prediabetes: Secondary | ICD-10-CM | POA: Diagnosis not present

## 2022-10-20 DIAGNOSIS — I10 Essential (primary) hypertension: Secondary | ICD-10-CM | POA: Insufficient documentation

## 2022-10-20 DIAGNOSIS — I4510 Unspecified right bundle-branch block: Secondary | ICD-10-CM | POA: Insufficient documentation

## 2022-10-20 MED ORDER — METOPROLOL TARTRATE 50 MG PO TABS: ORAL_TABLET | ORAL | 1 refills | Status: DC

## 2022-10-20 MED ORDER — FUROSEMIDE 20 MG PO TABS: ORAL_TABLET | ORAL | 1 refills | Status: DC

## 2022-10-20 MED ORDER — ATORVASTATIN CALCIUM 20 MG PO TABS: ORAL_TABLET | ORAL | 1 refills | Status: AC

## 2022-10-20 MED ORDER — AMLODIPINE BESYLATE 5 MG PO TABS: 5.0000 mg | ORAL_TABLET | Freq: Every day | ORAL | 1 refills | Status: DC

## 2022-10-20 NOTE — Patient Instructions (Signed)
Osteoarthritis  Osteoarthritis is a type of arthritis. It refers to joint pain or joint disease. Osteoarthritis affects tissue that covers the ends of bones in joints (cartilage). Cartilage acts as a cushion between the bones and helps them move smoothly. Osteoarthritis occurs when cartilage in the joints gets worn down. Osteoarthritis is sometimes called "wear and tear" arthritis. Osteoarthritis is the most common form of arthritis. It often occurs in older people. It is a condition that gets worse over time. The joints most often affected by this condition are in the fingers, toes, hips, knees, and spine, including the neck and lower back. What are the causes? This condition is caused by the wearing down of cartilage that covers the ends of bones. What increases the risk? The following factors may make you more likely to develop this condition: Being age 50 or older. Obesity. Overuse of joints. Past injury of a joint. Past surgery on a joint. Family history of osteoarthritis. What are the signs or symptoms? The main symptoms of this condition are pain, swelling, and stiffness in the joint. Other symptoms may include: An enlarged joint. More pain and further damage caused by small pieces of bone or cartilage that break off and float inside of the joint. Small deposits of bone (osteophytes) that grow on the edges of the joint. A grating or scraping feeling inside the joint when you move it. Popping or creaking sounds when you move. Difficulty walking or exercising. An inability to grip items, twist your hand(s), or control the movements of your hands and fingers. How is this diagnosed? This condition may be diagnosed based on: Your medical history. A physical exam. Your symptoms. X-rays of the affected joint(s). Blood tests to rule out other types of arthritis. How is this treated? There is no cure for this condition, but treatment can help control pain and improve joint function.  Treatment may include a combination of therapies, such as: Pain relief techniques, such as: Applying heat and cold to the joint. Massage. A form of talk therapy called cognitive behavioral therapy (CBT). This therapy helps you set goals and follow up on the changes that you make. Medicines for pain and inflammation. The medicines can be taken by mouth or applied to the skin. They include: NSAIDs, such as ibuprofen. Prescription medicines. Strong anti-inflammatory medicines (corticosteroids). Certain nutritional supplements. A prescribed exercise program. You may work with a physical therapist. Assistive devices, such as a brace, wrap, splint, specialized glove, or cane. A weight control plan. Surgery, such as: An osteotomy. This is done to reposition the bones and relieve pain or to remove loose pieces of bone and cartilage. Joint replacement surgery. You may need this surgery if you have advanced osteoarthritis. Follow these instructions at home: Activity Rest your affected joints as told by your health care provider. Exercise as told by your health care provider. He or she may recommend specific types of exercise, such as: Strengthening exercises. These are done to strengthen the muscles that support joints affected by arthritis. Aerobic activities. These are exercises, such as brisk walking or water aerobics, that increase your heart rate. Range-of-motion activities. These help your joints move more easily. Balance and agility exercises. Managing pain, stiffness, and swelling     If directed, apply heat to the affected area as often as told by your health care provider. Use the heat source that your health care provider recommends, such as a moist heat pack or a heating pad. If you have a removable assistive device, remove it   as told by your health care provider. Place a towel between your skin and the heat source. If your health care provider tells you to keep the assistive device  on while you apply heat, place a towel between the assistive device and the heat source. Leave the heat on for 20-30 minutes. Remove the heat if your skin turns bright red. This is especially important if you are unable to feel pain, heat, or cold. You may have a greater risk of getting burned. If directed, put ice on the affected area. To do this: If you have a removable assistive device, remove it as told by your health care provider. Put ice in a plastic bag. Place a towel between your skin and the bag. If your health care provider tells you to keep the assistive device on during icing, place a towel between the assistive device and the bag. Leave the ice on for 20 minutes, 2-3 times a day. Move your fingers or toes often to reduce stiffness and swelling. Raise (elevate) the injured area above the level of your heart while you are sitting or lying down. General instructions Take over-the-counter and prescription medicines only as told by your health care provider. Maintain a healthy weight. Follow instructions from your health care provider for weight control. Do not use any products that contain nicotine or tobacco, such as cigarettes, e-cigarettes, and chewing tobacco. If you need help quitting, ask your health care provider. Use assistive devices as told by your health care provider. Keep all follow-up visits as told by your health care provider. This is important. Where to find more information National Institute of Arthritis and Musculoskeletal and Skin Diseases: www.niams.nih.gov National Institute on Aging: www.nia.nih.gov American College of Rheumatology: www.rheumatology.org Contact a health care provider if: You have redness, swelling, or a feeling of warmth in a joint that gets worse. You have a fever along with joint or muscle aches. You develop a rash. You have trouble doing your normal activities. Get help right away if: You have pain that gets worse and is not relieved by  pain medicine. Summary Osteoarthritis is a type of arthritis that affects tissue covering the ends of bones in joints (cartilage). This condition is caused by the wearing down of cartilage that covers the ends of bones. The main symptom of this condition is pain, swelling, and stiffness in the joint. There is no cure for this condition, but treatment can help control pain and improve joint function. This information is not intended to replace advice given to you by your health care provider. Make sure you discuss any questions you have with your health care provider. Document Revised: 03/17/2022 Document Reviewed: 09/11/2019 Elsevier Patient Education  2023 Elsevier Inc.  

## 2022-10-20 NOTE — Progress Notes (Signed)
Subjective:  Patient ID: Katie Burns, adult    DOB: 09-08-1946  Age: 77 y.o. MRN: [id number]  CC: Pre-op Exam   HPI Katie Burns is a 77 y.o. year old adult patient of Dr. Wynetta Emery with a history of Prediabetes, hypertension, right knee osteoarthritis  Interval History: She presents today for preoperative medical clearance for right total knee arthroplasty by Dr. Erlinda Hong and is accompanied by her daughter to today's visit. Right TKA scheduled for 10/26/22. Right knee continues to hurt and she has ambulate with a cane.  BP is elevated and she did not take any antihypertensves till a few minutes in the exam room but at her last visit 2 mos ago BP was normal at her last office visit 2 months ago.  She has no dyspnea, chestpain and has a good exercise tolerance. Denies presence of additional symptoms Past Medical History:  Diagnosis Date   Hyperlipidemia    Hypertension     Past Surgical History:  Procedure Laterality Date   CATARACT EXTRACTION, BILATERAL Bilateral 2017   with lens implant   EYE SURGERY Bilateral 2015   cataract extractionwith lens implant    Family History  Problem Relation Age of Onset   Hypertension Daughter    Breast cancer Neg Hx     Social History   Socioeconomic History   Marital status: Married    Spouse name: Not on file   Number of children: 6   Years of education: 5 th grade   Highest education level: Not on file  Occupational History   Occupation: unempolyed  Tobacco Use   Smoking status: Never   Smokeless tobacco: Never  Vaping Use   Vaping Use: Never used  Substance and Sexual Activity   Alcohol use: No   Drug use: No   Sexual activity: Not on file  Other Topics Concern   Not on file  Social History Narrative   Not on file   Social Determinants of Health   Financial Resource Strain: Not on file  Food Insecurity: Not on file  Transportation Needs: Not on file  Physical Activity: Not on file  Stress: Not on file  Social  Connections: Not on file    No Known Allergies  Outpatient Medications Prior to Visit  Medication Sig Dispense Refill   acetaminophen (TYLENOL) 650 MG CR tablet Take 650 mg by mouth every 8 (eight) hours as needed for pain.     aspirin EC 81 MG tablet Take 1 tablet (81 mg total) by mouth 2 (two) times daily. To be taken after surgery to prevent blood clots 84 tablet 0   Blood Pressure Monitor DEVI Use as directed to check home blood pressure 2-3 times a week 1 Device 0   Cholecalciferol (VITAMIN D3 PO) Take 1 tablet by mouth daily.     cycloSPORINE (RESTASIS) 0.05 % ophthalmic emulsion Place 1 drop into both eyes daily as needed (dry/irritated eyes).     diclofenac Sodium (VOLTAREN) 1 % GEL Apply 2 g topically 4 (four) times daily. (Patient taking differently: Apply 2 g topically in the morning and at bedtime.) 350 g 1   docusate sodium (COLACE) 100 MG capsule Take 1 capsule (100 mg total) by mouth daily as needed. 30 capsule 2   ferrous sulfate (FEROSUL) 325 (65 FE) MG tablet Take one tab PO Q Mon/Wed and Frid. (Patient taking differently: Take 325 mg by mouth daily.) 90 tablet 1   MAGNESIUM PO Take 1 tablet by mouth daily.  meloxicam (MOBIC) 7.5 MG tablet Take 1 tablet (7.5 mg total) by mouth daily. Prn pain 30 tablet 1   methocarbamol (ROBAXIN-750) 750 MG tablet Take 1 tablet (750 mg total) by mouth 2 (two) times daily as needed for muscle spasms. 20 tablet 2   Multiple Vitamins-Minerals (CENTRUM SILVER ADULT 50+ PO) Take 1 tablet by mouth daily.     ondansetron (ZOFRAN) 4 MG tablet Take 1 tablet (4 mg total) by mouth every 8 (eight) hours as needed for nausea or vomiting. 40 tablet 0   amLODipine (NORVASC) 5 MG tablet Take 1.5 tablets (7.5 mg total) by mouth daily. (Patient taking differently: Take 5 mg by mouth daily.) 135 tablet 3   atorvastatin (LIPITOR) 20 MG tablet TAKE 1 TABLET(20 MG) BY MOUTH DAILY 90 tablet 1   furosemide (LASIX) 20 MG tablet TAKE 1 TABLET(20 MG) BY MOUTH DAILY  90 tablet 1   metoprolol tartrate (LOPRESSOR) 50 MG tablet TAKE 1 TABLET(50 MG) BY MOUTH DAILY 270 tablet 1   oxyCODONE-acetaminophen (PERCOCET) 5-325 MG tablet Take 1-2 tablets by mouth every 6 (six) hours as needed. To be taken after surgery (Patient not taking: Reported on 10/20/2022) 40 tablet 0   No facility-administered medications prior to visit.     ROS Review of Systems  Constitutional:  Negative for activity change and appetite change.  HENT:  Negative for sinus pressure and sore throat.   Respiratory:  Negative for chest tightness, shortness of breath and wheezing.   Cardiovascular:  Negative for chest pain and palpitations.  Gastrointestinal:  Negative for abdominal distention, abdominal pain and constipation.  Genitourinary: Negative.   Musculoskeletal:        See HPI  Psychiatric/Behavioral:  Negative for behavioral problems and dysphoric mood.     Objective:  BP (!) 152/78   Pulse 63   Ht _0  (1.575 m)   Wt 168 lb (76.2 kg)   SpO2 99%   BMI 30.73 kg/m      10/20/2022    9:51 AM 08/06/2022    2:00 PM 01/01/2022    3:57 PM  BP/Weight  Systolic BP 212 248 250  Diastolic BP 78 81 83  Wt. (Lbs) 168 169.6 164.2  BMI 30.73 kg/m2 31.02 kg/m2 30.03 kg/m2      Physical Exam Constitutional:      Appearance: He is well-developed.  Cardiovascular:     Rate and Rhythm: Normal rate.     Heart sounds: Normal heart sounds. No murmur heard. Pulmonary:     Effort: Pulmonary effort is normal.     Breath sounds: Normal breath sounds. No wheezing or rales.  Chest:     Chest wall: No tenderness.  Abdominal:     General: Bowel sounds are normal. There is no distension.     Palpations: Abdomen is soft. There is no mass.     Tenderness: There is no abdominal tenderness.  Musculoskeletal:     Right lower leg: No edema.     Left lower leg: No edema.     Comments: Tenderness and crepitus in right knee on range of motion  Neurological:     Mental Status: He is alert and  oriented to person, place, and time.  Psychiatric:        Mood and Affect: Mood normal.        Latest Ref Rng & Units 08/06/2022    2:21 PM 09/11/2021    4:30 PM 02/11/2021    3:10 PM  CMP  Glucose 70 -  99 mg/dL 119  98  91   BUN 8 - 27 mg/dL _0 Creatinine 0.57 - 1.00 mg/dL 1.15  0.85  0.84   Sodium 134 - 144 mmol/L 139  137  139   Potassium 3.5 - 5.2 mmol/L 4.8  4.3  4.3   Chloride 96 - 106 mmol/L 102  99  100   CO2 20 - 29 mmol/L _1 Calcium 8.7 - 10.3 mg/dL 10.1  9.7  10.2   Total Protein 6.0 - 8.5 g/dL 7.3  7.4    Total Bilirubin 0.0 - 1.2 mg/dL <0.2  <0.2    Alkaline Phos 44 - 121 IU/L 92  92    AST 0 - 40 IU/L 17  16    ALT 0 - 32 IU/L 8  12      Lipid Panel     Component Value Date/Time   CHOL 191 08/06/2022 1421   TRIG 248 (H) 08/06/2022 1421   HDL 57 08/06/2022 1421   CHOLHDL 3.4 08/06/2022 1421   LDLCALC 93 08/06/2022 1421    CBC    Component Value Date/Time   WBC 11.4 (H) 08/06/2022 1421   RBC 3.88 08/06/2022 1421   HGB 12.0 08/06/2022 1421   HCT 36.6 08/06/2022 1421   PLT 195 08/06/2022 1421   MCV 94 08/06/2022 1421   MCH 30.9 08/06/2022 1421   MCHC 32.8 08/06/2022 1421   RDW 11.7 08/06/2022 1421   LYMPHSABS 4.1 (H) 08/06/2022 1421   EOSABS 0.3 08/06/2022 1421   BASOSABS 0.1 08/06/2022 1421    Lab Results  Component Value Date   HGBA1C 5.7 (H) 08/06/2022    Assessment & Plan:  1. Essential hypertension Controlled due to the fact that she just took her antihypertensive Blood pressure was controlled at her last office visit at 133/81 hence I will make no regimen change today Counseled on blood pressure goal of less than 130/80, low-sodium, DASH diet, medication compliance, 150 minutes of moderate intensity exercise per week. Discussed medication compliance, adverse effects. - amLODipine (NORVASC) 5 MG tablet; Take 1 tablet (5 mg total) by mouth daily.  Dispense: 90 tablet; Refill: 1 - furosemide (LASIX) 20 MG tablet; TAKE 1  TABLET(20 MG) BY MOUTH DAILY  Dispense: 90 tablet; Refill: 1 - metoprolol tartrate (LOPRESSOR) 50 MG tablet; TAKE 1 TABLET(50 MG) BY MOUTH DAILY  Dispense: 270 tablet; Refill: 1  2. Hyperlipidemia, unspecified hyperlipidemia type Controlled Continue statin Low cholesterol diet - atorvastatin (LIPITOR) 20 MG tablet; TAKE 1 TABLET(20 MG) BY MOUTH DAILY  Dispense: 90 tablet; Refill: 1  3. 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 She is medically optimized for moderate risk procedure with a low MAC score No additional testing is recommended - CBC with Differential/Platelet - CMP14+EGFR  4. Primary osteoarthritis of right knee Will send clearance note to orthopedic for her anticipated right TKA    Meds ordered this encounter  Medications   amLODipine (NORVASC) 5 MG tablet    Sig: Take 1 tablet (5 mg total) by mouth daily.    Dispense:  90 tablet    Refill:  1    Dose increase   atorvastatin (LIPITOR) 20 MG tablet    Sig: TAKE 1 TABLET(20 MG) BY MOUTH DAILY    Dispense:  90 tablet    Refill:  1   furosemide (LASIX) 20 MG tablet    Sig:  TAKE 1 TABLET(20 MG) BY MOUTH DAILY    Dispense:  90 tablet    Refill:  1   metoprolol tartrate (LOPRESSOR) 50 MG tablet    Sig: TAKE 1 TABLET(50 MG) BY MOUTH DAILY    Dispense:  270 tablet    Refill:  1    Follow-up: Return in about 3 months (around 01/19/2023) for Medical conditions with PCP.       Charlott Rakes, MD, FAAFP. [facility], [place]   10/20/2022, 2:21 PM

## 2022-10-20 NOTE — Pre-Procedure Instructions (Signed)
Surgical Instructions    Your procedure is scheduled on Monday, January 29th.  Report to [facility] Main Entrance "A" at 7:15 A.M., then check in with the Admitting office.  Call this number if you have problems the morning of surgery:  [phone]  If you have any questions prior to your surgery date call [phone]: Open Monday-Friday 8am-4pm If you experience any cold or flu symptoms such as cough, fever, chills, shortness of breath, etc. between now and your scheduled surgery, please notify us at the above number.     Remember:  Do not eat after midnight the night before your surgery  You may drink clear liquids until 6:15 a.m. the morning of your surgery.   Clear liquids allowed are: Water, Non-Citrus Juices (without pulp), Carbonated Beverages, Clear Tea, Black Coffee Only (NO MILK, CREAM OR POWDERED CREAMER of any kind), and Gatorade.     Enhanced Recovery after Surgery for Orthopedics Enhanced Recovery after Surgery is a protocol used to improve the stress on your body and your recovery after surgery.  Patient Instructions  The day of surgery (if you do NOT have diabetes):  Drink ONE (1) Pre-Surgery Clear Ensure by 6:15 am the morning of surgery   This drink was given to you during your hospital  pre-op appointment visit. Nothing else to drink after completing the  Pre-Surgery Clear Ensure.         If you have questions, please contact your surgeon's office.   Take these medicines the morning of surgery with A SIP OF WATER  amLODipine (NORVASC)  atorvastatin (LIPITOR)  metoprolol tartrate (LOPRESSOR)   As of today, STOP taking any Aspirin (unless otherwise instructed by your surgeon) Aleve, Naproxen, Ibuprofen, Motrin, Advil, Goody's, BC's, all herbal medications, fish oil, and all vitamins. This includes: diclofenac Sodium (VOLTAREN) 1 % GEL and meloxicam (MOBIC).                      Do NOT Smoke (Tobacco/Vaping) for 24 hours prior to your procedure.  If you  use a CPAP at night, you may bring your mask/headgear for your overnight stay.   Contacts, glasses, piercing's, hearing aid's, dentures or partials may not be worn into surgery, please bring cases for these belongings.    For patients admitted to the hospital, discharge time will be determined by your treatment team.   Patients discharged the day of surgery will not be allowed to drive home, and someone needs to stay with them for 24 hours.  SURGICAL WAITING ROOM VISITATION Patients having surgery or a procedure may have no more than 2 support people in the waiting area - these visitors may rotate.   Children under the age of 34 must have an adult with them who is not the patient. If the patient needs to stay at the hospital during part of their recovery, the visitor guidelines for inpatient rooms apply. Pre-op nurse will coordinate an appropriate time for 1 support person to accompany patient in pre-op.  This support person may not rotate.   Please refer to the [facility] website for the visitor guidelines for Inpatients (after your surgery is over and you are in a regular room).    Special instructions:   [place]- Preparing For Surgery  Before surgery, you can play an important role. Because skin is not sterile, your skin needs to be as free of germs as possible. You can reduce the number of germs on your skin by washing with CHG (chlorahexidine  gluconate) Soap before surgery.  CHG is an antiseptic cleaner which kills germs and bonds with the skin to continue killing germs even after washing.    Oral Hygiene is also important to reduce your risk of infection.  Remember - BRUSH YOUR TEETH THE MORNING OF SURGERY WITH YOUR REGULAR TOOTHPASTE  Please do not use if you have an allergy to CHG or antibacterial soaps. If your skin becomes reddened/irritated stop using the CHG.  Do not shave (including legs and underarms) for at least 48 hours prior to first CHG shower. It is OK to shave your  face.  Please follow these instructions carefully.   Shower the NIGHT BEFORE SURGERY and the MORNING OF SURGERY  If you chose to wash your hair, wash your hair first as usual with your normal shampoo.  After you shampoo, rinse your hair and body thoroughly to remove the shampoo.  Use CHG Soap as you would any other liquid soap. You can apply CHG directly to the skin and wash gently with a scrungie or a clean washcloth.   Apply the CHG Soap to your body ONLY FROM THE NECK DOWN.  Do not use on open wounds or open sores. Avoid contact with your eyes, ears, mouth and genitals (private parts). Wash Face and genitals (private parts)  with your normal soap.   Wash thoroughly, paying special attention to the area where your surgery will be performed.  Thoroughly rinse your body with warm water from the neck down.  DO NOT shower/wash with your normal soap after using and rinsing off the CHG Soap.  Pat yourself dry with a CLEAN TOWEL.  Wear CLEAN PAJAMAS to bed the night before surgery  Place CLEAN SHEETS on your bed the night before your surgery  DO NOT SLEEP WITH PETS.   Day of Surgery: Take a shower with CHG soap. Do not wear jewelry or makeup Do not wear lotions, powders, perfumes, or deodorant. Do not shave 48 hours prior to surgery.   Do not bring valuables to the hospital.  [facility] is not responsible for any belongings or valuables. Do not wear nail polish, gel polish, artificial nails, or any other type of covering on natural nails (fingers and toes) If you have artificial nails or gel coating that need to be removed by a nail salon, please have this removed prior to surgery. Artificial nails or gel coating may interfere with anesthesia's ability to adequately monitor your vital signs. Wear Clean/Comfortable clothing the morning of surgery Remember to brush your teeth WITH YOUR REGULAR TOOTHPASTE.   Please read over the following fact sheets that you were given.    If  you received a COVID test during your pre-op visit  it is requested that you wear a mask when out in public, stay away from anyone that may not be feeling well and notify your surgeon if you develop symptoms. If you have been in contact with anyone that has tested positive in the last 10 days please notify you surgeon.

## 2022-10-21 ENCOUNTER — Encounter (HOSPITAL_COMMUNITY)
Admission: RE | Admit: 2022-10-21 | Discharge: 2022-10-21 | Disposition: A | Discharge status: Home / Self Care | Payer: Medicare Other | Source: Ambulatory Visit | Attending: Orthopaedic Surgery | Admitting: Orthopaedic Surgery

## 2022-10-21 ENCOUNTER — Other Ambulatory Visit: Payer: Self-pay

## 2022-10-21 ENCOUNTER — Encounter (HOSPITAL_COMMUNITY): Payer: Self-pay

## 2022-10-21 VITALS — BP 126/72 | HR 66 | Temp 97.6°F | Resp 17 | Ht 60.0 in | Wt 168.0 lb

## 2022-10-21 DIAGNOSIS — I10 Essential (primary) hypertension: Secondary | ICD-10-CM | POA: Insufficient documentation

## 2022-10-21 DIAGNOSIS — E785 Hyperlipidemia, unspecified: Secondary | ICD-10-CM | POA: Insufficient documentation

## 2022-10-21 DIAGNOSIS — Z01818 Encounter for other preprocedural examination: Secondary | ICD-10-CM

## 2022-10-21 DIAGNOSIS — E669 Obesity, unspecified: Secondary | ICD-10-CM | POA: Insufficient documentation

## 2022-10-21 DIAGNOSIS — M1711 Unilateral primary osteoarthritis, right knee: Secondary | ICD-10-CM | POA: Insufficient documentation

## 2022-10-21 DIAGNOSIS — Z6832 Body mass index (BMI) 32.0-32.9, adult: Secondary | ICD-10-CM | POA: Insufficient documentation

## 2022-10-21 LAB — CBC WITH DIFFERENTIAL/PLATELET
Basophils Absolute: 0.1 10*3/uL (ref 0.0–0.2)
Basos: 1 %
EOS (ABSOLUTE): 0.2 10*3/uL (ref 0.0–0.4)
Eos: 2 %
Hematocrit: 36.6 % (ref 34.0–46.6)
Hemoglobin: 12 g/dL (ref 11.1–15.9)
Immature Grans (Abs): 0 10*3/uL (ref 0.0–0.1)
Immature Granulocytes: 0 %
Lymphocytes Absolute: 3.2 10*3/uL — ABNORMAL HIGH (ref 0.7–3.1)
Lymphs: 30 %
MCH: 30.2 pg (ref 26.6–33.0)
MCHC: 32.8 g/dL (ref 31.5–35.7)
MCV: 92 fL (ref 79–97)
Monocytes Absolute: 0.7 10*3/uL (ref 0.1–0.9)
Monocytes: 6 %
Neutrophils Absolute: 6.5 10*3/uL (ref 1.4–7.0)
Neutrophils: 61 %
Platelets: 228 10*3/uL (ref 150–450)
RBC: 3.97 x10E6/uL (ref 3.77–5.28)
RDW: 11.7 % (ref 11.7–15.4)
WBC: 10.6 10*3/uL (ref 3.4–10.8)

## 2022-10-21 LAB — CMP14+EGFR
ALT: 12 IU/L (ref 0–32)
AST: 22 IU/L (ref 0–40)
Albumin/Globulin Ratio: 1.6 (ref 1.2–2.2)
Albumin: 4.4 g/dL (ref 3.8–4.8)
Alkaline Phosphatase: 92 IU/L (ref 44–121)
BUN/Creatinine Ratio: 18 (ref 12–28)
BUN: 17 mg/dL (ref 8–27)
Bilirubin Total: 0.4 mg/dL (ref 0.0–1.2)
CO2: 20 mmol/L (ref 20–29)
Calcium: 9.9 mg/dL (ref 8.7–10.3)
Chloride: 102 mmol/L (ref 96–106)
Creatinine, Ser: 0.94 mg/dL (ref 0.57–1.00)
Globulin, Total: 2.8 g/dL (ref 1.5–4.5)
Glucose: 87 mg/dL (ref 70–99)
Potassium: 4.7 mmol/L (ref 3.5–5.2)
Sodium: 137 mmol/L (ref 134–144)
Total Protein: 7.2 g/dL (ref 6.0–8.5)
eGFR: 63 mL/min/{1.73_m2} (ref >59)

## 2022-10-21 LAB — SURGICAL PCR SCREEN
MRSA, PCR: NEGATIVE
Staphylococcus aureus: NEGATIVE

## 2022-10-21 NOTE — Progress Notes (Signed)
PCP - Dr. Karle Plumber Cardiologist - denies  PPM/ICD - n/a Device Orders - n/a Rep Notified - n/a  Chest x-ray - n/a EKG - 10/20/22 Stress Test - denies ECHO - denies Cardiac Cath - denies  Sleep Study - denies CPAP - n/a  Fasting Blood Sugar - n/a Checks Blood Sugar _____ times a day- n/a  Last dose of GLP1 agonist-  n/a GLP1 instructions: n/a  Blood Thinner Instructions: n/a Aspirin Instructions: n/a  ERAS Protcol - Clear liquids until 0615 am day of surgery PRE-SURGERY Ensure or G2- Ensure  COVID TEST- n/a   Anesthesia review: Yes. EKG Review.  Patient denies shortness of breath, fever, cough and chest pain at PAT appointment   All instructions explained to the patient, with a verbal understanding of the material. Patient agrees to go over the instructions while at home for a better understanding. The opportunity to ask questions was provided.

## 2022-10-22 NOTE — Progress Notes (Signed)
Anesthesia Chart Review:  Case: [id number] Date/Time: 10/26/22 0900   Procedure: RIGHT TOTAL KNEE ARTHROPLASTY (Right: Knee)   Anesthesia type: Spinal   Pre-op diagnosis: RIGHT KNEE OSTEOARTHRITIS   Location: [place] OR ROOM 07 / [place] OR   Surgeons: Leandrew Koyanagi, MD       DISCUSSION: Patient is a 77 year old female scheduled for the above procedure.  History includes never smoker, HTN, HLD.  BMI is consistent with obesity.  Preoperative evaluation on 10/20/22 by Charlott Rakes, MD with labs and EKG. She denied dyspnea and chest pain and reported good exercise tolerance.  BP controlled. Per Dr. Margarita Rana, "She is medically optimized for moderate risk procedure with a low MAC score No additional testing is recommended".  Anesthesia team to evaluate on the day of surgery.   VS: BP 126/72   Pulse 66   Temp 36.4 C   Resp 17   Ht 5' (1.524 m)   Wt 76.2 kg   SpO2 100%   BMI 32.81 kg/m    PROVIDERS: Ladell Pier, MD is PCP ([place])   LABS: She had labs on 10/20/22. Results include: Lab Results  Component Value Date   WBC 10.6 10/20/2022   HGB 12.0 10/20/2022   HCT 36.6 10/20/2022   PLT 228 10/20/2022   GLUCOSE 87 10/20/2022   CHOL 191 08/06/2022   TRIG 248 (H) 08/06/2022   HDL 57 08/06/2022   LDLCALC 93 08/06/2022   ALT 12 10/20/2022   AST 22 10/20/2022   NA 137 10/20/2022   K 4.7 10/20/2022   CL 102 10/20/2022   CREATININE 0.94 10/20/2022   BUN 17 10/20/2022   CO2 20 10/20/2022   HGBA1C 5.7 (H) 08/06/2022     IMAGES: Xray right knee 08/20/22: 3hree-view x-rays of the right knee show significant varus deformity.   Medial compartment is bone-on-bone.   Xray left knee 08/19/22: Three-view x-rays of the left knee show severe tricompartmental  osteoarthritis with varus deformity.  Medial compartment is bone-on-bone.    EKG: 10/20/22: Sinus rhythm with 1st degree A-V block Incomplete right bundle branch block Borderline ECG No  previous ECGs available No previous tracing Confirmed by Quay Burow [phone]) on 10/20/2022 5:17:07 PM  CV: N/A  Past Medical History:  Diagnosis Date   Hyperlipidemia    Hypertension     Past Surgical History:  Procedure Laterality Date   CATARACT EXTRACTION, BILATERAL Bilateral 2017   with lens implant   COLONOSCOPY     EYE SURGERY Bilateral 2015   cataract extractionwith lens implant    MEDICATIONS:  acetaminophen (TYLENOL) 650 MG CR tablet   amLODipine (NORVASC) 5 MG tablet   aspirin EC 81 MG tablet   atorvastatin (LIPITOR) 20 MG tablet   Blood Pressure Monitor DEVI   Cholecalciferol (VITAMIN D3 PO)   cycloSPORINE (RESTASIS) 0.05 % ophthalmic emulsion   diclofenac Sodium (VOLTAREN) 1 % GEL   docusate sodium (COLACE) 100 MG capsule   ferrous sulfate (FEROSUL) 325 (65 FE) MG tablet   furosemide (LASIX) 20 MG tablet   MAGNESIUM PO   meloxicam (MOBIC) 7.5 MG tablet   methocarbamol (ROBAXIN-750) 750 MG tablet   metoprolol tartrate (LOPRESSOR) 50 MG tablet   Multiple Vitamins-Minerals (CENTRUM SILVER ADULT 50+ PO)   ondansetron (ZOFRAN) 4 MG tablet   oxyCODONE-acetaminophen (PERCOCET) 5-325 MG tablet   No current facility-administered medications for this encounter.    Myra Gianotti, PA-C Surgical Short Stay/Anesthesiology [facility] Phone [phone] [facility] Phone [phone]  10/22/2022 12:51 PM

## 2022-10-22 NOTE — Anesthesia Preprocedure Evaluation (Addendum)
Anesthesia Evaluation  Patient identified by MRN, date of birth, ID band Patient awake    Reviewed: Allergy & Precautions, NPO status , Patient's Chart, lab work & pertinent test results, reviewed documented beta blocker date and time   Airway Mallampati: II  TM Distance: >3 FB Neck ROM: Full    Dental  (+) Teeth Intact, Dental Advisory Given   Pulmonary neg pulmonary ROS   Pulmonary exam normal breath sounds clear to auscultation       Cardiovascular hypertension, Pt. on medications and Pt. on home beta blockers Normal cardiovascular exam Rhythm:Regular Rate:Normal     Neuro/Psych negative neurological ROS     GI/Hepatic negative GI ROS, Neg liver ROS,,,  Endo/Other  Obesity   Renal/GU negative Renal ROS     Musculoskeletal  (+) Arthritis ,  RIGHT KNEE OSTEOARTHRITIS   Abdominal   Peds  Hematology negative hematology ROS (+) Plt 228k   Anesthesia Other Findings Day of surgery medications reviewed with the patient.  Reproductive/Obstetrics                             Anesthesia Physical Anesthesia Plan  ASA: 2  Anesthesia Plan: Spinal   Post-op Pain Management: Tylenol PO (pre-op)* and Regional block*   Induction: Intravenous  PONV Risk Score and Plan: 2 and TIVA, Ondansetron and Dexamethasone  Airway Management Planned: Natural Airway and Simple Face Mask  Additional Equipment:   Intra-op Plan:   Post-operative Plan:   Informed Consent: I have reviewed the patients History and Physical, chart, labs and discussed the procedure including the risks, benefits and alternatives for the proposed anesthesia with the patient or authorized representative who has indicated his/her understanding and acceptance.     Dental advisory given and Interpreter used for interveiw  Plan Discussed with: CRNA, Anesthesiologist and Surgeon  Anesthesia Plan Comments: (PAT note written 10/22/2022  by Myra Gianotti, PA-C.  )       Anesthesia Quick Evaluation

## 2022-10-26 ENCOUNTER — Other Ambulatory Visit: Payer: Self-pay

## 2022-10-26 ENCOUNTER — Encounter (HOSPITAL_COMMUNITY)
Admission: RE | Disposition: A | Discharge status: Home / Self Care | Payer: Self-pay | Source: Ambulatory Visit | Attending: Orthopaedic Surgery

## 2022-10-26 ENCOUNTER — Observation Stay (HOSPITAL_COMMUNITY): Payer: Medicare Other

## 2022-10-26 ENCOUNTER — Observation Stay (HOSPITAL_COMMUNITY)
Admission: RE | Admit: 2022-10-26 | Discharge: 2022-10-27 | Disposition: A | Discharge status: Home / Self Care | Payer: Medicare Other | Source: Ambulatory Visit | Attending: Orthopaedic Surgery | Admitting: Orthopaedic Surgery

## 2022-10-26 ENCOUNTER — Observation Stay (HOSPITAL_BASED_OUTPATIENT_CLINIC_OR_DEPARTMENT_OTHER): Payer: Medicare Other | Admitting: Certified Registered Nurse Anesthetist

## 2022-10-26 ENCOUNTER — Observation Stay (HOSPITAL_COMMUNITY): Payer: Medicare Other | Admitting: Vascular Surgery

## 2022-10-26 ENCOUNTER — Encounter (HOSPITAL_COMMUNITY): Payer: Self-pay | Admitting: Orthopaedic Surgery

## 2022-10-26 ENCOUNTER — Ambulatory Visit: Payer: Self-pay | Admitting: Internal Medicine

## 2022-10-26 DIAGNOSIS — I10 Essential (primary) hypertension: Secondary | ICD-10-CM | POA: Insufficient documentation

## 2022-10-26 DIAGNOSIS — M1711 Unilateral primary osteoarthritis, right knee: Secondary | ICD-10-CM | POA: Diagnosis not present

## 2022-10-26 DIAGNOSIS — Z79899 Other long term (current) drug therapy: Secondary | ICD-10-CM | POA: Insufficient documentation

## 2022-10-26 DIAGNOSIS — Z6832 Body mass index (BMI) 32.0-32.9, adult: Secondary | ICD-10-CM | POA: Diagnosis not present

## 2022-10-26 DIAGNOSIS — Z7982 Long term (current) use of aspirin: Secondary | ICD-10-CM | POA: Insufficient documentation

## 2022-10-26 DIAGNOSIS — Z96651 Presence of right artificial knee joint: Secondary | ICD-10-CM

## 2022-10-26 DIAGNOSIS — E669 Obesity, unspecified: Secondary | ICD-10-CM | POA: Diagnosis not present

## 2022-10-26 HISTORY — PX: TOTAL KNEE ARTHROPLASTY: SHX125

## 2022-10-26 SURGERY — ARTHROPLASTY, KNEE, TOTAL
Anesthesia: Spinal | Site: Knee | Laterality: Right

## 2022-10-26 MED ORDER — ONDANSETRON HCL 4 MG/2ML IJ SOLN
INTRAMUSCULAR | Status: AC
  Filled 2022-10-26: qty 2

## 2022-10-26 MED ORDER — LACTATED RINGERS IV SOLN: INTRAVENOUS | Status: DC

## 2022-10-26 MED ORDER — HYDROMORPHONE HCL 1 MG/ML IJ SOLN
0.2500 mg | INTRAMUSCULAR | Status: DC | PRN
  Administered 2022-10-26: 0.25 mg via INTRAVENOUS
  Administered 2022-10-26: 0.5 mg via INTRAVENOUS
  Administered 2022-10-26: 0.25 mg via INTRAVENOUS

## 2022-10-26 MED ORDER — LIDOCAINE 2% (20 MG/ML) 5 ML SYRINGE
INTRAMUSCULAR | Status: AC
  Filled 2022-10-26: qty 5

## 2022-10-26 MED ORDER — PROPOFOL 10 MG/ML IV BOLUS
INTRAVENOUS | Status: AC
  Filled 2022-10-26: qty 20

## 2022-10-26 MED ORDER — METOPROLOL TARTRATE 50 MG PO TABS
50.0000 mg | ORAL_TABLET | Freq: Every day | ORAL | Status: DC
  Filled 2022-10-26: qty 1

## 2022-10-26 MED ORDER — FERROUS SULFATE 325 (65 FE) MG PO TABS
325.0000 mg | ORAL_TABLET | Freq: Three times a day (TID) | ORAL | Status: DC
  Administered 2022-10-26 – 2022-10-27 (×3): 325 mg via ORAL
  Filled 2022-10-26 (×4): qty 1

## 2022-10-26 MED ORDER — ACETAMINOPHEN 500 MG PO TABS
1000.0000 mg | ORAL_TABLET | Freq: Four times a day (QID) | ORAL | Status: AC
  Administered 2022-10-26 – 2022-10-27 (×4): 1000 mg via ORAL
  Filled 2022-10-26 (×3): qty 2

## 2022-10-26 MED ORDER — ONDANSETRON HCL 4 MG PO TABS: 4.0000 mg | ORAL_TABLET | Freq: Four times a day (QID) | ORAL | Status: DC | PRN

## 2022-10-26 MED ORDER — OXYCODONE HCL 5 MG PO TABS
ORAL_TABLET | ORAL | Status: AC
  Filled 2022-10-26: qty 2

## 2022-10-26 MED ORDER — AMLODIPINE BESYLATE 5 MG PO TABS
5.0000 mg | ORAL_TABLET | Freq: Every day | ORAL | Status: DC
  Filled 2022-10-26: qty 1

## 2022-10-26 MED ORDER — 0.9 % SODIUM CHLORIDE (POUR BTL) OPTIME
TOPICAL | Status: DC | PRN
  Administered 2022-10-26: 1000 mL

## 2022-10-26 MED ORDER — FENTANYL CITRATE (PF) 100 MCG/2ML IJ SOLN
INTRAMUSCULAR | Status: AC
  Filled 2022-10-26: qty 2

## 2022-10-26 MED ORDER — CEFAZOLIN SODIUM-DEXTROSE 2-4 GM/100ML-% IV SOLN
2.0000 g | Freq: Four times a day (QID) | INTRAVENOUS | Status: AC
  Administered 2022-10-26 – 2022-10-27 (×2): 2 g via INTRAVENOUS
  Filled 2022-10-26 (×2): qty 100

## 2022-10-26 MED ORDER — ONDANSETRON HCL 4 MG/2ML IJ SOLN
4.0000 mg | Freq: Four times a day (QID) | INTRAMUSCULAR | Status: DC | PRN
  Administered 2022-10-26: 4 mg via INTRAVENOUS
  Filled 2022-10-26: qty 2

## 2022-10-26 MED ORDER — METHOCARBAMOL 500 MG PO TABS
ORAL_TABLET | ORAL | Status: AC
  Filled 2022-10-26: qty 1

## 2022-10-26 MED ORDER — TRANEXAMIC ACID-NACL 1000-0.7 MG/100ML-% IV SOLN
1000.0000 mg | INTRAVENOUS | Status: AC
  Administered 2022-10-26: 1000 mg via INTRAVENOUS
  Filled 2022-10-26: qty 100

## 2022-10-26 MED ORDER — METOCLOPRAMIDE HCL 5 MG PO TABS: 5.0000 mg | ORAL_TABLET | Freq: Three times a day (TID) | ORAL | Status: DC | PRN

## 2022-10-26 MED ORDER — KETOROLAC TROMETHAMINE 15 MG/ML IJ SOLN
INTRAMUSCULAR | Status: AC
  Filled 2022-10-26: qty 1

## 2022-10-26 MED ORDER — FENTANYL CITRATE (PF) 100 MCG/2ML IJ SOLN
25.0000 ug | INTRAMUSCULAR | Status: DC | PRN
  Administered 2022-10-26: 50 ug via INTRAVENOUS
  Administered 2022-10-26: 25 ug via INTRAVENOUS
  Administered 2022-10-26: 50 ug via INTRAVENOUS
  Administered 2022-10-26: 25 ug via INTRAVENOUS

## 2022-10-26 MED ORDER — METOCLOPRAMIDE HCL 5 MG/ML IJ SOLN
5.0000 mg | Freq: Three times a day (TID) | INTRAMUSCULAR | Status: DC | PRN
  Filled 2022-10-26: qty 2

## 2022-10-26 MED ORDER — KETOROLAC TROMETHAMINE 15 MG/ML IJ SOLN
7.5000 mg | Freq: Four times a day (QID) | INTRAMUSCULAR | Status: AC
  Administered 2022-10-26 – 2022-10-27 (×4): 7.5 mg via INTRAVENOUS
  Filled 2022-10-26 (×3): qty 1

## 2022-10-26 MED ORDER — PHENOL 1.4 % MT LIQD: 1.0000 | OROMUCOSAL | Status: DC | PRN

## 2022-10-26 MED ORDER — CHLORHEXIDINE GLUCONATE 0.12 % MT SOLN
15.0000 mL | Freq: Once | OROMUCOSAL | Status: AC
  Administered 2022-10-26: 15 mL via OROMUCOSAL
  Filled 2022-10-26: qty 15

## 2022-10-26 MED ORDER — PROPOFOL 500 MG/50ML IV EMUL
INTRAVENOUS | Status: DC | PRN
  Administered 2022-10-26: 50 ug/kg/min via INTRAVENOUS

## 2022-10-26 MED ORDER — OXYCODONE HCL ER 10 MG PO T12A
10.0000 mg | EXTENDED_RELEASE_TABLET | Freq: Two times a day (BID) | ORAL | Status: DC
  Administered 2022-10-26 – 2022-10-27 (×2): 10 mg via ORAL
  Filled 2022-10-26 (×2): qty 1

## 2022-10-26 MED ORDER — ACETAMINOPHEN 500 MG PO TABS
1000.0000 mg | ORAL_TABLET | Freq: Once | ORAL | Status: AC
  Administered 2022-10-26: 1000 mg via ORAL
  Filled 2022-10-26: qty 2

## 2022-10-26 MED ORDER — SODIUM CHLORIDE 0.9 % IV SOLN: INTRAVENOUS | Status: DC

## 2022-10-26 MED ORDER — PROPOFOL 10 MG/ML IV BOLUS
INTRAVENOUS | Status: DC | PRN
  Administered 2022-10-26 (×2): 20 mg via INTRAVENOUS

## 2022-10-26 MED ORDER — TRANEXAMIC ACID-NACL 1000-0.7 MG/100ML-% IV SOLN
1000.0000 mg | Freq: Once | INTRAVENOUS | Status: AC
  Administered 2022-10-26: 1000 mg via INTRAVENOUS
  Filled 2022-10-26: qty 100

## 2022-10-26 MED ORDER — ONDANSETRON HCL 4 MG/2ML IJ SOLN: 4.0000 mg | Freq: Once | INTRAMUSCULAR | Status: DC | PRN

## 2022-10-26 MED ORDER — TRANEXAMIC ACID 1000 MG/10ML IV SOLN
2000.0000 mg | INTRAVENOUS | Status: DC
  Filled 2022-10-26: qty 20

## 2022-10-26 MED ORDER — METHOCARBAMOL 500 MG PO TABS
500.0000 mg | ORAL_TABLET | Freq: Four times a day (QID) | ORAL | Status: DC | PRN
  Administered 2022-10-26 – 2022-10-27 (×2): 500 mg via ORAL
  Filled 2022-10-26: qty 1

## 2022-10-26 MED ORDER — ROPIVACAINE HCL 5 MG/ML IJ SOLN
INTRAMUSCULAR | Status: DC | PRN
  Administered 2022-10-26: 20 mL via PERINEURAL

## 2022-10-26 MED ORDER — HYDROMORPHONE HCL 1 MG/ML IJ SOLN
0.5000 mg | INTRAMUSCULAR | Status: DC | PRN
  Administered 2022-10-27: 1 mg via INTRAVENOUS
  Filled 2022-10-26: qty 1

## 2022-10-26 MED ORDER — ASPIRIN 81 MG PO CHEW
81.0000 mg | CHEWABLE_TABLET | Freq: Two times a day (BID) | ORAL | Status: DC
  Administered 2022-10-26 – 2022-10-27 (×2): 81 mg via ORAL
  Filled 2022-10-26 (×2): qty 1

## 2022-10-26 MED ORDER — DEXAMETHASONE SODIUM PHOSPHATE 10 MG/ML IJ SOLN
10.0000 mg | Freq: Once | INTRAMUSCULAR | Status: AC
  Administered 2022-10-27: 10 mg via INTRAVENOUS
  Filled 2022-10-26 (×2): qty 1

## 2022-10-26 MED ORDER — SODIUM CHLORIDE 0.9 % IR SOLN
Status: DC | PRN
  Administered 2022-10-26: 1000 mL

## 2022-10-26 MED ORDER — VANCOMYCIN HCL 1000 MG IV SOLR
INTRAVENOUS | Status: AC
  Filled 2022-10-26: qty 20

## 2022-10-26 MED ORDER — ORAL CARE MOUTH RINSE: 15.0000 mL | Freq: Once | OROMUCOSAL | Status: AC

## 2022-10-26 MED ORDER — PHENYLEPHRINE HCL-NACL 20-0.9 MG/250ML-% IV SOLN
INTRAVENOUS | Status: DC | PRN
  Administered 2022-10-26: 25 ug/min via INTRAVENOUS

## 2022-10-26 MED ORDER — EPHEDRINE SULFATE-NACL 50-0.9 MG/10ML-% IV SOSY
PREFILLED_SYRINGE | INTRAVENOUS | Status: DC | PRN
  Administered 2022-10-26: 10 mg via INTRAVENOUS

## 2022-10-26 MED ORDER — LIDOCAINE 2% (20 MG/ML) 5 ML SYRINGE
INTRAMUSCULAR | Status: DC | PRN
  Administered 2022-10-26: 20 mg via INTRAVENOUS

## 2022-10-26 MED ORDER — POVIDONE-IODINE 10 % EX SWAB
2.0000 | Freq: Once | CUTANEOUS | Status: AC
  Administered 2022-10-26: 2 via TOPICAL

## 2022-10-26 MED ORDER — METHOCARBAMOL 1000 MG/10ML IJ SOLN: 500.0000 mg | Freq: Four times a day (QID) | INTRAVENOUS | Status: DC | PRN

## 2022-10-26 MED ORDER — CEFAZOLIN SODIUM-DEXTROSE 2-4 GM/100ML-% IV SOLN
2.0000 g | INTRAVENOUS | Status: AC
  Administered 2022-10-26: 2 g via INTRAVENOUS
  Filled 2022-10-26: qty 100

## 2022-10-26 MED ORDER — DOCUSATE SODIUM 100 MG PO CAPS
100.0000 mg | ORAL_CAPSULE | Freq: Two times a day (BID) | ORAL | Status: DC
  Administered 2022-10-26 – 2022-10-27 (×2): 100 mg via ORAL
  Filled 2022-10-26 (×2): qty 1

## 2022-10-26 MED ORDER — HYDROMORPHONE HCL 1 MG/ML IJ SOLN
INTRAMUSCULAR | Status: AC
  Filled 2022-10-26: qty 1

## 2022-10-26 MED ORDER — OXYCODONE HCL 5 MG PO TABS
5.0000 mg | ORAL_TABLET | ORAL | Status: DC | PRN
  Administered 2022-10-27: 5 mg via ORAL
  Filled 2022-10-26: qty 1

## 2022-10-26 MED ORDER — BUPIVACAINE-MELOXICAM ER 400-12 MG/14ML IJ SOLN
INTRAMUSCULAR | Status: AC
  Filled 2022-10-26: qty 1

## 2022-10-26 MED ORDER — TRANEXAMIC ACID 1000 MG/10ML IV SOLN
INTRAVENOUS | Status: DC | PRN
  Administered 2022-10-26: 2000 mg via TOPICAL

## 2022-10-26 MED ORDER — VANCOMYCIN HCL 1000 MG IV SOLR
INTRAVENOUS | Status: DC | PRN
  Administered 2022-10-26: 1000 mg via TOPICAL

## 2022-10-26 MED ORDER — FENTANYL CITRATE (PF) 100 MCG/2ML IJ SOLN: 50.0000 ug | Freq: Once | INTRAMUSCULAR | Status: AC

## 2022-10-26 MED ORDER — FENTANYL CITRATE (PF) 100 MCG/2ML IJ SOLN
INTRAMUSCULAR | Status: AC
  Administered 2022-10-26: 50 ug via INTRAVENOUS
  Filled 2022-10-26: qty 2

## 2022-10-26 MED ORDER — MENTHOL 3 MG MT LOZG: 1.0000 | LOZENGE | OROMUCOSAL | Status: DC | PRN

## 2022-10-26 MED ORDER — OXYCODONE HCL 5 MG PO TABS
10.0000 mg | ORAL_TABLET | ORAL | Status: DC | PRN
  Administered 2022-10-26: 10 mg via ORAL

## 2022-10-26 MED ORDER — ONDANSETRON HCL 4 MG/2ML IJ SOLN
INTRAMUSCULAR | Status: DC | PRN
  Administered 2022-10-26: 4 mg via INTRAVENOUS

## 2022-10-26 MED ORDER — MIDAZOLAM HCL 2 MG/2ML IJ SOLN
INTRAMUSCULAR | Status: AC
  Filled 2022-10-26: qty 2

## 2022-10-26 MED ORDER — BUPIVACAINE-MELOXICAM ER 400-12 MG/14ML IJ SOLN
INTRAMUSCULAR | Status: DC | PRN
  Administered 2022-10-26: 400 mg

## 2022-10-26 MED ORDER — PRONTOSAN WOUND IRRIGATION OPTIME
TOPICAL | Status: DC | PRN
  Administered 2022-10-26: 1 via TOPICAL

## 2022-10-26 MED ORDER — ACETAMINOPHEN 325 MG PO TABS: 325.0000 mg | ORAL_TABLET | Freq: Four times a day (QID) | ORAL | Status: DC | PRN

## 2022-10-26 SURGICAL SUPPLY — 86 items
ALCOHOL 70% 16 OZ (MISCELLANEOUS) ×1 IMPLANT
BAG COUNTER SPONGE SURGICOUNT (BAG) IMPLANT
BAG DECANTER FOR FLEXI CONT (MISCELLANEOUS) ×1 IMPLANT
BANDAGE ESMARK 6X9 LF (GAUZE/BANDAGES/DRESSINGS) IMPLANT
BLADE SAG 18X100X1.27 (BLADE) ×1 IMPLANT
BLADE SAW SGTL 73X25 THK (BLADE) ×1 IMPLANT
BNDG ESMARK 6X9 LF (GAUZE/BANDAGES/DRESSINGS)
BOWL SMART MIX CTS (DISPOSABLE) ×1 IMPLANT
CEMENT BONE REFOBACIN R1X40 US (Cement) IMPLANT
CLSR STERI-STRIP ANTIMIC 1/2X4 (GAUZE/BANDAGES/DRESSINGS) ×2 IMPLANT
COMP FEM CMT PS STD 4 RT (Joint) ×1 IMPLANT
COMP PATELLAR 29 STD 8 THK (Orthopedic Implant) IMPLANT
COMPONENT FEM CMT PS STD 4 RT (Joint) IMPLANT
COOLER ICEMAN CLASSIC (MISCELLANEOUS) ×1 IMPLANT
COVER SURGICAL LIGHT HANDLE (MISCELLANEOUS) ×1 IMPLANT
CUFF TOURN SGL QUICK 34 (TOURNIQUET CUFF) ×1
CUFF TOURN SGL QUICK 42 (TOURNIQUET CUFF) IMPLANT
CUFF TRNQT CYL 34X4.125X (TOURNIQUET CUFF) ×1 IMPLANT
DERMABOND ADVANCED .7 DNX12 (GAUZE/BANDAGES/DRESSINGS) ×1 IMPLANT
DRAPE EXTREMITY T 121X128X90 (DISPOSABLE) ×1 IMPLANT
DRAPE HALF SHEET 40X57 (DRAPES) ×1 IMPLANT
DRAPE INCISE IOBAN 66X45 STRL (DRAPES) ×1 IMPLANT
DRAPE ORTHO SPLIT 77X108 STRL (DRAPES)
DRAPE POUCH INSTRU U-SHP 10X18 (DRAPES) ×1 IMPLANT
DRAPE SURG ORHT 6 SPLT 77X108 (DRAPES) IMPLANT
DRAPE U-SHAPE 47X51 STRL (DRAPES) ×2 IMPLANT
DRSG AQUACEL AG ADV 3.5X10 (GAUZE/BANDAGES/DRESSINGS) ×1 IMPLANT
DURAPREP 26ML APPLICATOR (WOUND CARE) ×3 IMPLANT
ELECT CAUTERY BLADE 6.4 (BLADE) ×1 IMPLANT
ELECT PENCIL ROCKER SW 15FT (MISCELLANEOUS) ×1 IMPLANT
ELECT REM PT RETURN 9FT ADLT (ELECTROSURGICAL) ×1
ELECTRODE REM PT RTRN 9FT ADLT (ELECTROSURGICAL) ×1 IMPLANT
GLOVE BIOGEL PI IND STRL 7.0 (GLOVE) ×2 IMPLANT
GLOVE BIOGEL PI IND STRL 7.5 (GLOVE) ×5 IMPLANT
GLOVE ECLIPSE 7.0 STRL STRAW (GLOVE) ×3 IMPLANT
GLOVE INDICATOR 7.0 STRL GRN (GLOVE) ×1 IMPLANT
GLOVE INDICATOR 7.5 STRL GRN (GLOVE) ×1 IMPLANT
GLOVE SURG SYN 7.5  E (GLOVE) ×2
GLOVE SURG SYN 7.5 E (GLOVE) ×2 IMPLANT
GLOVE SURG SYN 7.5 PF PI (GLOVE) ×2 IMPLANT
GLOVE SURG UNDER LTX SZ7.5 (GLOVE) ×2 IMPLANT
GLOVE SURG UNDER POLY LF SZ7 (GLOVE) ×2 IMPLANT
GOWN STRL REIN XL XLG (GOWN DISPOSABLE) ×1 IMPLANT
GOWN STRL REUS W/ TWL LRG LVL3 (GOWN DISPOSABLE) ×1 IMPLANT
GOWN STRL REUS W/TWL LRG LVL3 (GOWN DISPOSABLE) ×1
GOWN TOGA ZIPPER T7+ PEEL AWAY (MISCELLANEOUS) ×2 IMPLANT
HANDPIECE INTERPULSE COAX TIP (DISPOSABLE) ×1
HDLS TROCR DRIL PIN KNEE 75 (PIN) ×1
HOOD PEEL AWAY FLYTE STAYCOOL (MISCELLANEOUS) ×1 IMPLANT
IMPL ASF PSN RT 14 4-5/CD (Joint) IMPLANT
IMPLANT ASF PSN RT 14 4-5/CD (Joint) ×1 IMPLANT
KIT BASIN OR (CUSTOM PROCEDURE TRAY) ×1 IMPLANT
KIT TURNOVER KIT B (KITS) ×1 IMPLANT
MANIFOLD NEPTUNE II (INSTRUMENTS) ×1 IMPLANT
MARKER SKIN DUAL TIP RULER LAB (MISCELLANEOUS) ×2 IMPLANT
NDL SPNL 18GX3.5 QUINCKE PK (NEEDLE) ×1 IMPLANT
NEEDLE SPNL 18GX3.5 QUINCKE PK (NEEDLE) ×1 IMPLANT
NS IRRIG 1000ML POUR BTL (IV SOLUTION) ×1 IMPLANT
PACK TOTAL JOINT (CUSTOM PROCEDURE TRAY) ×1 IMPLANT
PAD ARMBOARD 7.5X6 YLW CONV (MISCELLANEOUS) ×2 IMPLANT
PAD COLD SHLDR WRAP-ON (PAD) ×1 IMPLANT
PATELLA ZIMMER 29MM (Orthopedic Implant) ×1 IMPLANT
PIN DRILL HDLS TROCAR 75 4PK (PIN) IMPLANT
SAW OSC TIP CART 19.5X105X1.3 (SAW) ×1 IMPLANT
SCREW FEMALE HEX FIX 25X2.5 (ORTHOPEDIC DISPOSABLE SUPPLIES) IMPLANT
SET HNDPC FAN SPRY TIP SCT (DISPOSABLE) ×1 IMPLANT
SOLUTION PRONTOSAN WOUND 350ML (IRRIGATION / IRRIGATOR) ×1 IMPLANT
STAPLER VISISTAT 35W (STAPLE) IMPLANT
STEM TIBIA 5 DEG SZ C R KNEE (Knees) IMPLANT
SUCTION FRAZIER HANDLE 10FR (MISCELLANEOUS) ×1
SUCTION TUBE FRAZIER 10FR DISP (MISCELLANEOUS) ×1 IMPLANT
SUT ETHILON 2 0 FS 18 (SUTURE) IMPLANT
SUT MNCRL AB 3-0 PS2 27 (SUTURE) IMPLANT
SUT VIC AB 0 CT1 27 (SUTURE) ×2
SUT VIC AB 0 CT1 27XBRD ANBCTR (SUTURE) ×2 IMPLANT
SUT VIC AB 1 CTX 27 (SUTURE) ×3 IMPLANT
SUT VIC AB 2-0 CT1 27 (SUTURE) ×4
SUT VIC AB 2-0 CT1 TAPERPNT 27 (SUTURE) ×4 IMPLANT
SYR 50ML LL SCALE MARK (SYRINGE) ×2 IMPLANT
TIBIA STEM 5 DEG SZ C R KNEE (Knees) ×1 IMPLANT
TOWEL GREEN STERILE (TOWEL DISPOSABLE) ×1 IMPLANT
TOWEL GREEN STERILE FF (TOWEL DISPOSABLE) ×1 IMPLANT
TRAY CATH 16FR W/PLASTIC CATH (SET/KITS/TRAYS/PACK) IMPLANT
TUBE SUCT ARGYLE STRL (TUBING) ×1 IMPLANT
UNDERPAD 30X36 HEAVY ABSORB (UNDERPADS AND DIAPERS) ×1 IMPLANT
YANKAUER SUCT BULB TIP NO VENT (SUCTIONS) ×2 IMPLANT

## 2022-10-26 NOTE — Anesthesia Procedure Notes (Signed)
Anesthesia Regional Block: Adductor canal block   Pre-Anesthetic Checklist: , timeout performed,  Correct Patient, Correct Site, Correct Laterality,  Correct Procedure, Correct Position, site marked,  Risks and benefits discussed,  Surgical consent,  Pre-op evaluation,  At surgeon's request and post-op pain management  Laterality: Right  Prep: chloraprep       Needles:  Injection technique: Single-shot  Needle Type: Echogenic Needle     Needle Length: 9cm  Needle Gauge: 21     Additional Needles:   Procedures:,,,, ultrasound used (permanent image in chart),,    Narrative:  Start time: 10/26/2022 8:14 AM End time: 10/26/2022 8:21 AM Injection made incrementally with aspirations every 5 mL.  Performed by: Personally  Anesthesiologist: Santa Lighter, MD  Additional Notes: No pain on injection. No increased resistance to injection. Injection made in 5cc increments.  Good needle visualization.  Patient tolerated procedure well.

## 2022-10-26 NOTE — Op Note (Signed)
Total Knee Arthroplasty Procedure Note  Preoperative diagnosis: Right knee osteoarthritis  Postoperative diagnosis:same  Operative findings: Varus deformity Severe tricompartmental arthritis  Operative procedure: Right total knee arthroplasty. CPT [phone]  Surgeon: N. Eduard Roux, MD  Assist: Ky Barban, RNFA  Anesthesia: Spinal, regional, local  Tourniquet time: see anesthesia record  Implants used: Zimmer persona cemented Femur: CR 4 Tibia: C Patella: 29 mm Polyethylene: 14 mm, medial congruent  Indication: Katie Burns is a 77 y.o. year old female with a history of knee pain. Having failed conservative management, the patient elected to proceed with a total knee arthroplasty.  We have reviewed the risk and benefits of the surgery and they elected to proceed after voicing understanding.  Procedure:  After informed consent was obtained and understanding of the risk were voiced including but not limited to bleeding, infection, damage to surrounding structures including nerves and vessels, blood clots, leg length inequality and the failure to achieve desired results, the operative extremity was marked with verbal confirmation of the patient in the holding area.   The patient was then brought to the operating room and transported to the operating room table in the supine position.  A tourniquet was applied to the operative extremity around the upper thigh. The operative limb was then prepped and draped in the usual sterile fashion and preoperative antibiotics were administered.  A time out was performed prior to the start of surgery confirming the correct extremity, preoperative antibiotic administration, as well as team members, implants and instruments available for the case. Correct surgical site was also confirmed with preoperative radiographs. The limb was then elevated for exsanguination and the tourniquet was inflated. A midline incision was made and a standard medial  parapatellar approach was performed.  The infrapatellar fat pad was removed.  Suprapatellar synovium was removed to reveal the anterior distal femoral cortex.  A medial peel was performed to release the capsule of the medial tibial plateau.  The patella was then everted and was prepared and sized to a 29 mm.  A cover was placed on the patella for protection from retractors.  The knee was then brought into full flexion and we then turned our attention to the femur.  The cruciates were sacrificed.  Start site was drilled in the femur and the intramedullary distal femoral cutting guide was placed, set at 3 degrees valgus, taking 10 mm of distal resection. The distal cut was made. Osteophytes were then removed.  Next, the proximal tibial cutting guide was placed with appropriate slope, varus/valgus alignment and depth of resection. The proximal tibial cut was made taking 2 mm off the low side. Gap blocks were then used to assess the extension gap and alignment, and appropriate soft tissue releases were performed. Attention was turned back to the femur, which was sized using the sizing guide to a size 4. Appropriate rotation of the femoral component was determined using epicondylar axis, Whiteside's line, and assessing the flexion gap under ligament tension. The appropriate size 4-in-1 cutting block was placed and checked with an angel wing and cuts were made. Posterior femoral osteophytes and uncapped bone were then removed with the curved osteotome.  Trial components were placed, and stability was checked in full extension, mid-flexion, and deep flexion. Proper tibial rotation was determined and marked.  The patella tracked well without a lateral release.  The femoral lugs were then drilled. Trial components were then removed and tibial preparation performed.  The tibia was sized for a size C component.  The bony surfaces were irrigated with a pulse lavage and then dried. Bone cement was vacuum mixed on the back  table, and the final components sized above were cemented into place.  Antibiotic irrigation was placed in the knee joint and soft tissues while the cement cured.  After cement had finished curing, excess cement was removed. The stability of the construct was re-evaluated throughout a range of motion and found to be acceptable. The trial liner was removed, the knee was copiously irrigated, and the knee was re-evaluated for any excess bone debris. The real polyethylene liner, 14 mm thick, was inserted and checked to ensure the locking mechanism had engaged appropriately. The tourniquet was deflated and hemostasis was achieved. The wound was irrigated with normal saline.  One gram of vancomycin powder was placed in the surgical bed.  Topical 0.25% bupivacaine and meloxicam was placed in the joint for postoperative pain.  Capsular closure was performed with a #1 vicryl, subcutaneous fat closed with a 0 vicryl suture, then subcutaneous tissue closed with interrupted 2.0 vicryl suture. The skin was then closed with a 2.0 nylon and dermabond. A sterile dressing was applied.  The patient was awakened in the operating room and taken to recovery in stable condition. All sponge, needle, and instrument counts were correct at the end of the case.  [name] was necessary for opening, closing, retracting, limb positioning and overall facilitation and completion of the surgery.  Position: supine  Complications: none.  Time Out: performed   Drains/Packing: none  Estimated blood loss: minimal  Returned to Recovery Room: in good condition.   Antibiotics: yes   Mechanical VTE (DVT) Prophylaxis: sequential compression devices, TED thigh-high  Chemical VTE (DVT) Prophylaxis: aspirin  Fluid Replacement  Crystalloid: see anesthesia record Blood: none  FFP: none   Specimens Removed: 1 to pathology   Sponge and Instrument Count Correct? yes   PACU: portable radiograph - knee AP and Lateral   Plan/RTC:  Return in 2 weeks for wound check.   Weight Bearing/Load Lower Extremity: full   Implant Name Type Inv. Item Serial No. Manufacturer Lot No. LRB No. Used Action  CEMENT BONE REFOBACIN [id number] [place] - [id number] Cement CEMENT BONE REFOBACIN [id number] [place]  ZIMMER RECON(ORTH,TRAU,BIO,SG) [id number] Right 2 Implanted  COMP FEM CMT PS STD 4 RT - [id number] Joint COMP FEM CMT PS STD 4 RT  ZIMMER RECON(ORTH,TRAU,BIO,SG) [id number] Right 1 Implanted  TIBIA STEM 5 DEG SZ C R KNEE - [id number] Knees TIBIA STEM 5 DEG SZ C R KNEE  ZIMMER RECON(ORTH,TRAU,BIO,SG) [id number] Right 1 Implanted  IMPLANT ASF PSN RT 14 4-5/CD - [id number] Joint IMPLANT ASF PSN RT 14 4-5/CD  ZIMMER RECON(ORTH,TRAU,BIO,SG) [id number] Right 1 Implanted  nexgen complete knee solution    ZIMMER [id number] Right 1 Implanted    N. Eduard Roux, MD [facility] 10:44 AM

## 2022-10-26 NOTE — Discharge Instructions (Signed)

## 2022-10-26 NOTE — H&P (Addendum)
PREOPERATIVE H&P  Chief Complaint: RIGHT KNEE OSTEOARTHRITIS  HPI: Katie Burns is a 77 y.o. female who presents for surgical treatment of RIGHT KNEE OSTEOARTHRITIS.  She denies any changes in medical history.  Past Medical History:  Diagnosis Date   Hyperlipidemia    Hypertension    Past Surgical History:  Procedure Laterality Date   CATARACT EXTRACTION, BILATERAL Bilateral 2017   with lens implant   COLONOSCOPY     EYE SURGERY Bilateral 2015   cataract extractionwith lens implant   Social History   Socioeconomic History   Marital status: Married    Spouse name: Not on file   Number of children: 6   Years of education: 5 th grade   Highest education level: Not on file  Occupational History   Occupation: unempolyed  Tobacco Use   Smoking status: Never   Smokeless tobacco: Never  Vaping Use   Vaping Use: Never used  Substance and Sexual Activity   Alcohol use: No   Drug use: No   Sexual activity: Not on file  Other Topics Concern   Not on file  Social History Narrative   Not on file   Social Determinants of Health   Financial Resource Strain: Not on file  Food Insecurity: Not on file  Transportation Needs: Not on file  Physical Activity: Not on file  Stress: Not on file  Social Connections: Not on file   Family History  Problem Relation Age of Onset   Hypertension Daughter    Breast cancer Neg Hx    No Known Allergies Prior to Admission medications   Medication Sig Start Date End Date Taking? Authorizing Provider  acetaminophen (TYLENOL) 650 MG CR tablet Take 650 mg by mouth every 8 (eight) hours as needed for pain.   Yes [provider]  Cholecalciferol (VITAMIN D3 PO) Take 1 tablet by mouth daily.   Yes [provider]  cycloSPORINE (RESTASIS) 0.05 % ophthalmic emulsion Place 1 drop into both eyes daily as needed (dry/irritated eyes).   Yes [provider]  diclofenac Sodium (VOLTAREN) 1 % GEL Apply 2 g topically 4  (four) times daily. Patient taking differently: Apply 2 g topically in the morning and at bedtime. 08/06/22  Yes Argentina Donovan, PA-C  ferrous sulfate (FEROSUL) 325 (65 FE) MG tablet Take one tab PO Q Mon/Wed and Frid. Patient taking differently: Take 325 mg by mouth daily. 08/06/22  Yes Argentina Donovan, PA-C  MAGNESIUM PO Take 1 tablet by mouth daily.   Yes [provider]  Multiple Vitamins-Minerals (CENTRUM SILVER ADULT 50+ PO) Take 1 tablet by mouth daily.   Yes [provider]  amLODipine (NORVASC) 5 MG tablet Take 1 tablet (5 mg total) by mouth daily. 10/20/22   Charlott Rakes, MD  aspirin EC 81 MG tablet Take 1 tablet (81 mg total) by mouth 2 (two) times daily. To be taken after surgery to prevent blood clots 10/19/22 10/19/23  Aundra Dubin, PA-C  atorvastatin (LIPITOR) 20 MG tablet TAKE 1 TABLET(20 MG) BY MOUTH DAILY 10/20/22   Charlott Rakes, MD  Blood Pressure Monitor DEVI Use as directed to check home blood pressure 2-3 times a week 02/16/19   Ladell Pier, MD  docusate sodium (COLACE) 100 MG capsule Take 1 capsule (100 mg total) by mouth daily as needed. 10/19/22 10/19/23  Aundra Dubin, PA-C  furosemide (LASIX) 20 MG tablet TAKE 1 TABLET(20 MG) BY MOUTH DAILY 10/20/22   Charlott Rakes, MD  meloxicam (MOBIC) 7.5 MG tablet Take 1 tablet (7.5 mg total) by mouth daily. Prn pain 08/06/22   Argentina Donovan, PA-C  methocarbamol (ROBAXIN-750) 750 MG tablet Take 1 tablet (750 mg total) by mouth 2 (two) times daily as needed for muscle spasms. 10/19/22   Aundra Dubin, PA-C  metoprolol tartrate (LOPRESSOR) 50 MG tablet TAKE 1 TABLET(50 MG) BY MOUTH DAILY 10/20/22   Charlott Rakes, MD  ondansetron (ZOFRAN) 4 MG tablet Take 1 tablet (4 mg total) by mouth every 8 (eight) hours as needed for nausea or vomiting. 10/19/22   Aundra Dubin, PA-C  oxyCODONE-acetaminophen (PERCOCET) 5-325 MG tablet Take 1-2 tablets by mouth every 6 (six) hours as needed. To be taken after  surgery Patient not taking: Reported on 10/20/2022 10/19/22   Aundra Dubin, PA-C     Positive ROS: All other systems have been reviewed and were otherwise negative with the exception of those mentioned in the HPI and as above.  Physical Exam: General: Alert, no acute distress Cardiovascular: No pedal edema Respiratory: No cyanosis, no use of accessory musculature GI: abdomen soft Skin: No lesions in the area of chief complaint Neurologic: Sensation intact distally Psychiatric: Patient is competent for consent with normal mood and affect Lymphatic: no lymphedema  MUSCULOSKELETAL: exam stable  Assessment: RIGHT KNEE OSTEOARTHRITIS  Plan: Plan for Procedure(s): RIGHT TOTAL KNEE ARTHROPLASTY  The risks benefits and alternatives were discussed with the patient including but not limited to the risks of nonoperative treatment, versus surgical intervention including infection, bleeding, nerve injury,  blood clots, cardiopulmonary complications, morbidity, mortality, among others, and they were willing to proceed.   Eduard Roux, MD 10/26/2022 7:10 AM

## 2022-10-26 NOTE — Transfer of Care (Signed)
Immediate Anesthesia Transfer of Care Note  Patient: Katie Burns  Procedure(s) Performed: RIGHT TOTAL KNEE ARTHROPLASTY (Right: Knee)  Patient Location: PACU  Anesthesia Type:MAC combined with regional for post-op pain  Level of Consciousness: awake, alert , and oriented  Airway & Oxygen Therapy: Patient Spontanous Breathing  Post-op Assessment: Report given to RN and Post -op Vital signs reviewed and stable  Post vital signs: Reviewed and stable  Last Vitals:  Vitals Value Taken Time  BP 117/71 10/26/22 1130  Temp    Pulse 75 10/26/22 1131  Resp 20 10/26/22 1131  SpO2 95 % 10/26/22 1131  Vitals shown include unvalidated device data.  Last Pain:  Vitals:   10/26/22 0840  TempSrc:   PainSc: 0-No pain         Complications: No notable events documented.

## 2022-10-27 ENCOUNTER — Encounter (HOSPITAL_COMMUNITY): Payer: Self-pay | Admitting: Orthopaedic Surgery

## 2022-10-27 DIAGNOSIS — M1711 Unilateral primary osteoarthritis, right knee: Secondary | ICD-10-CM | POA: Diagnosis not present

## 2022-10-27 LAB — CBC
HCT: 30.4 % — ABNORMAL LOW (ref 36.0–46.0)
Hemoglobin: 10.4 g/dL — ABNORMAL LOW (ref 12.0–15.0)
MCH: 31.5 pg (ref 26.0–34.0)
MCHC: 34.2 g/dL (ref 30.0–36.0)
MCV: 92.1 fL (ref 80.0–100.0)
Platelets: 169 10*3/uL (ref 150–400)
RBC: 3.3 MIL/uL — ABNORMAL LOW (ref 3.87–5.11)
RDW: 11.6 % (ref 11.5–15.5)
WBC: 10.6 10*3/uL — ABNORMAL HIGH (ref 4.0–10.5)
nRBC: 0 % (ref 0.0–0.2)

## 2022-10-27 MED ORDER — BUPIVACAINE IN DEXTROSE 0.75-8.25 % IT SOLN
INTRATHECAL | Status: DC | PRN
  Administered 2022-10-26: 1.6 mL via INTRATHECAL

## 2022-10-27 NOTE — Anesthesia Procedure Notes (Signed)
Spinal  Patient location during procedure: OR Start time: 10/26/2022 8:57 AM End time: 10/26/2022 9:00 AM Reason for block: surgical anesthesia Staffing Performed: anesthesiologist  Anesthesiologist: Santa Lighter, MD Performed by: Santa Lighter, MD Authorized by: Santa Lighter, MD   Preanesthetic Checklist Completed: patient identified, IV checked, risks and benefits discussed, surgical consent, monitors and equipment checked, pre-op evaluation and timeout performed Spinal Block Patient position: sitting Prep: DuraPrep and site prepped and draped Patient monitoring: continuous pulse ox and blood pressure Approach: midline Location: L3-4 Injection technique: single-shot Needle Needle type: Pencan  Needle gauge: 24 G Assessment Events: CSF return Additional Notes Functioning IV was confirmed and monitors were applied. Sterile prep and drape, including hand hygiene, mask and sterile gloves were used. The patient was positioned and the spine was prepped. The skin was anesthetized with lidocaine.  Free flow of clear CSF was obtained prior to injecting local anesthetic into the CSF.  The spinal needle aspirated freely following injection.  The needle was carefully withdrawn.  The patient tolerated the procedure well. Consent was obtained prior to procedure with all questions answered and concerns addressed. Risks including but not limited to bleeding, infection, nerve damage, paralysis, failed block, inadequate analgesia, allergic reaction, high spinal, itching and headache were discussed and the patient wished to proceed.   Hoy Morn, MD

## 2022-10-27 NOTE — Care Management Obs Status (Signed)
[place] NOTIFICATION   Patient Details  Name: Katie Burns MRN: [id number] Date of Birth: April 17, 1946   Medicare Observation Status Notification Given:  Yes    Carles Collet, RN 10/27/2022, 9:40 AM

## 2022-10-27 NOTE — Anesthesia Postprocedure Evaluation (Signed)
Anesthesia Post Note  Patient: Charene Mccallister  Procedure(s) Performed: RIGHT TOTAL KNEE ARTHROPLASTY (Right: Knee)     Patient location during evaluation: PACU Anesthesia Type: Spinal Level of consciousness: awake, awake and alert and oriented Pain management: pain level controlled Vital Signs Assessment: post-procedure vital signs reviewed and stable Respiratory status: spontaneous breathing, nonlabored ventilation and respiratory function stable Cardiovascular status: blood pressure returned to baseline and stable Postop Assessment: no headache, no backache, spinal receding and no apparent nausea or vomiting Anesthetic complications: no   No notable events documented.  Last Vitals:  Vitals:   10/26/22 1730 10/26/22 2019  BP: (!) 91/59 115/64  Pulse: 75 61  Resp: 18 20  Temp: 36.6 C (!) 36.4 C  SpO2: 100% 94%    Last Pain:  Vitals:   10/27/22 0338  TempSrc:   PainSc: Raymer Gelene Recktenwald

## 2022-10-27 NOTE — Progress Notes (Signed)
Physical Therapy Treatment Patient Details Name: Katie Burns MRN: [id number] DOB: 1945-10-24 Today's Date: 10/27/2022   History of Present Illness 77 y.o. female presents to [facility] hospital on 10/26/2022 for elective R TKA. PMH includes HTN, HLD, cataract extraction.    PT Comments    Pt progressing well with ambulation and practiced stairs with daughter present. Handout given to help recall of sequencing. Pt ambulated 125' with RW and supervision.  Reviewed precautions, there ex, use of ice, elevation and CPM. Questions answered regarding d/c. Pt ready for d/c home if cleared by physician, otherwise will follow tomorrow.    Recommendations for follow up therapy are one component of a multi-disciplinary discharge planning process, led by the attending physician.  Recommendations may be updated based on patient status, additional functional criteria and insurance authorization.  Follow Up Recommendations  Follow physician's recommendations for discharge plan and follow up therapies     Assistance Recommended at Discharge Intermittent Supervision/Assistance  Patient can return home with the following Help with stairs or ramp for entrance;Assist for transportation;Assistance with cooking/housework;A little help with bathing/dressing/bathroom   Equipment Recommendations  None recommended by PT    Recommendations for Other Services       Precautions / Restrictions Precautions Precautions: Knee Precaution Booklet Issued: No Precaution Comments: reviewed proper positioning again. Pt wants pillow under knee for comfort. Explained to daughter why pillow needs to be under calf/ ankle and daughter voices understanding Restrictions Weight Bearing Restrictions: No RLE Weight Bearing: Weight bearing as tolerated     Mobility  Bed Mobility Overal bed mobility: Needs Assistance Bed Mobility: Sit to Supine     Supine to sit: Supervision Sit to supine: Min assist   General bed mobility  comments: min A to RLE    Transfers Overall transfer level: Needs assistance Equipment used: Rolling walker (2 wheels) Transfers: Sit to/from Stand Sit to Stand: Supervision           General transfer comment: pt with good recall of hand placement with transfers. Supervision for safety    Ambulation/Gait Ambulation/Gait assistance: Supervision Gait Distance (Feet): 125 Feet Assistive device: Rolling walker (2 wheels) Gait Pattern/deviations: Step-through pattern, Decreased weight shift to right, Antalgic Gait velocity: decreased Gait velocity interpretation: <1.8 ft/sec, indicate of risk for recurrent falls   General Gait Details: better mvmt of RW than AM session, worked on increasing distance   Stairs Stairs: Yes Stairs assistance: Min assist Stair Management: Two rails, Step to pattern, Forwards Number of Stairs: 6 General stair comments: vc's for sequencing, daughter present for education. Pt will have rail on once side and family member's hand on other. Handout given to help remember sequencing.   Wheelchair Mobility    Modified Rankin (Stroke Patients Only)       Balance Overall balance assessment: Mild deficits observed, not formally tested                                          Cognition Arousal/Alertness: Awake/alert Behavior During Therapy: WFL for tasks assessed/performed Overall Cognitive Status: Within Functional Limits for tasks assessed                                 General Comments: pt with good carryover of info from morning session. Sometimes question if what daughter is relaying to pt is completely accurate  Exercises Total Joint Exercises Ankle Circles/Pumps: AROM, Both, 10 reps, Seated Quad Sets: AROM, Both, 10 reps, Seated Heel Slides: AROM, Right, 10 reps, Seated Hip ABduction/ADduction: AROM, Right, 10 reps, Seated Straight Leg Raises: AROM, Right, 10 reps, Supine Long Arc Quad: AROM,  Right, 5 reps, Seated Knee Flexion: AROM, Right, 10 reps, Seated Goniometric ROM: 5-70    General Comments General comments (skin integrity, edema, etc.): reviewed activity level for return home as well as use of CPM, ther ex, ice, and elevation. Questions answered from pt and daughter      Pertinent Vitals/Pain Pain Assessment Pain Assessment: Faces Pain Score: 7  Faces Pain Scale: Hurts whole lot Pain Location: right knee Pain Descriptors / Indicators: Aching, Sore Pain Intervention(s): Limited activity within patient's tolerance, Monitored during session, Patient requesting pain meds-RN notified    Home Living Family/patient expects to be discharged to:: Private residence Living Arrangements: Children;Spouse/significant other Available Help at Discharge: Family;Available 24 hours/day (for 2 weeks) Type of Home: House Home Access: Stairs to enter Entrance Stairs-Rails: Right;Left Entrance Stairs-Number of Steps: 2 Alternate Level Stairs-Number of Steps: 6-landing-6 Home Layout: Two level Home Equipment: Conservation officer, nature (2 wheels);Cane - single point;BSC/3in1;Grab bars - tub/shower;Hand held shower head Additional Comments: daughter is taking 2 weeks off work to help out at home    Prior Function            PT Goals (current goals can now be found in the care plan section) Acute Rehab PT Goals Patient Stated Goal: return home PT Goal Formulation: With patient Time For Goal Achievement: 11/03/22 Potential to Achieve Goals: Good Progress towards PT goals: Progressing toward goals    Frequency    7X/week      PT Plan Current plan remains appropriate    Co-evaluation              AM-PAC PT "6 Clicks" Mobility   Outcome Measure  Help needed turning from your back to your side while in a flat bed without using bedrails?: None Help needed moving from lying on your back to sitting on the side of a flat bed without using bedrails?: None Help needed moving to  and from a bed to a chair (including a wheelchair)?: A Little Help needed standing up from a chair using your arms (e.g., wheelchair or bedside chair)?: A Little Help needed to walk in hospital room?: A Little Help needed climbing 3-5 steps with a railing? : A Little 6 Click Score: 20    End of Session Equipment Utilized During Treatment: Gait belt Activity Tolerance: Patient tolerated treatment well Patient left: with call bell/phone within reach;with family/visitor present;in bed Nurse Communication: Mobility status PT Visit Diagnosis: Pain;Difficulty in walking, not elsewhere classified (R26.2) Pain - Right/Left: Right Pain - part of body: Knee     Time: 1450-1531 PT Time Calculation (min) (ACUTE ONLY): 41 min  Charges:  $Gait Training: 23-37 mins $Therapeutic Exercise: 8-22 mins                     Leighton Roach, PT  Acute Rehab Services Secure chat preferred Office [place] 10/27/2022, 4:54 PM

## 2022-10-27 NOTE — Progress Notes (Signed)
Subjective: 1 Day Post-Op Procedure(s) (LRB): RIGHT TOTAL KNEE ARTHROPLASTY (Right) Patient reports pain as mild.    Objective: Vital signs in last 24 hours: Temp:  [97.5 F (36.4 C)-98.7 F (37.1 C)] 98.7 F (37.1 C) (01/30 0840) Pulse Rate:  [57-75] 66 (01/30 0840) Resp:  [14-21] 17 (01/30 0840) BP: (91-140)/(49-81) 93/55 (01/30 0840) SpO2:  [91 %-100 %] 91 % (01/30 0840)  Intake/Output from previous day: 01/29 0701 - 01/30 0700 In: 1600 [I.V.:1400; IV Piggyback:200] Out: 1550 [Urine:1475; Blood:75] Intake/Output this shift: No intake/output data recorded.  Recent Labs    10/27/22 0502  HGB 10.4*   Recent Labs    10/27/22 0502  WBC 10.6*  RBC 3.30*  HCT 30.4*  PLT 169   No results for input(s): "NA", "K", "CL", "CO2", "BUN", "CREATININE", "GLUCOSE", "CALCIUM" in the last 72 hours. No results for input(s): "LABPT", "INR" in the last 72 hours.  Neurologically intact Neurovascular intact Sensation intact distally Intact pulses distally Dorsiflexion/Plantar flexion intact Incision: dressing C/D/I No cellulitis present Compartment soft   Assessment/Plan: 1 Day Post-Op Procedure(s) (LRB): RIGHT TOTAL KNEE ARTHROPLASTY (Right) Advance diet Up with therapy D/C IV fluids Discharge home with home health once cleared by PT (likely after second PT session vs tomorrow) If not cleared by PT, please cancel d/c order for today      Aundra Dubin 10/27/2022, 9:43 AM

## 2022-10-27 NOTE — TOC Transition Note (Signed)
Transition of Care [facility]) - CM/SW Discharge Note   Patient Details  Name: Katie Burns MRN: [id number] Date of Birth: 06-28-1946  [facility] CM/SW Contact:  Carles Collet, RN Phone Number: 10/27/2022, 9:46 AM   Clinical Narrative:     Damaris Schooner w patient's adult children at bedside son and daughter in law, they declined interpreter.  Patient set up with [facility] prior to admission for home health services. Patient has RW, 3/1, and CPM machine at home.  Family initially stated they wanted WC for home, and after speaking with PA they have decided that it would hinder ambulation at home and patient will have enough support from RW.  Patient will have transportation from family for DC home.      Barriers to Discharge: No Barriers Identified   Patient Goals and CMS Choice      Discharge Placement                         Discharge Plan and Services Additional resources added to the After Visit Summary for     Discharge Planning Services: CM Consult Post Acute Care Choice: Durable Medical Equipment, Home Health                    HH Arranged: PT [facility] Agency: [place] Date [place]: 10/27/22 Time [place]: [phone] Representative spoke with at [place]: [place] Determinants of Health ([place]) Interventions SDOH Screenings   Food Insecurity: No Food Insecurity (10/26/2022)  Housing: Low Risk  (10/26/2022)  Transportation Needs: No Transportation Needs (10/26/2022)  Utilities: Not At Risk (10/26/2022)  Depression (PHQ2-9): Low Risk  (08/06/2022)  Tobacco Use: Low Risk  (10/26/2022)     Readmission Risk Interventions     No data to display

## 2022-10-27 NOTE — Evaluation (Signed)
Physical Therapy Evaluation Patient Details Name: Katie Burns MRN: [id number] DOB: 1945-12-29 Today's Date: 10/27/2022  History of Present Illness  77 y.o. female presents to [facility] hospital on 10/26/2022 for elective R TKA. PMH includes HTN, HLD, cataract extraction.  Clinical Impression  Pt admitted with above diagnosis. Pt moving well POD 1 though is impulsive and education is somewhat complicated with daughter interpreting though this is what pt/ family prefers.   Pt ambulated 15' +35' with RW and supervision. Working on proper use of RW and proper placement of it during gait, Will continue this in afternoon session and then pt should be ready for d/c home. Pt currently with functional limitations due to the deficits listed below (see PT Problem List). Pt will benefit from skilled PT to increase their independence and safety with mobility to allow discharge to the venue listed below.          Recommendations for follow up therapy are one component of a multi-disciplinary discharge planning process, led by the attending physician.  Recommendations may be updated based on patient status, additional functional criteria and insurance authorization.  Follow Up Recommendations Follow physician's recommendations for discharge plan and follow up therapies      Assistance Recommended at Discharge Intermittent Supervision/Assistance  Patient can return home with the following  Help with stairs or ramp for entrance;Assist for transportation;Assistance with cooking/housework;A little help with bathing/dressing/bathroom    Equipment Recommendations None recommended by PT  Recommendations for Other Services       Functional Status Assessment Patient has had a recent decline in their functional status and demonstrates the ability to make significant improvements in function in a reasonable and predictable amount of time.     Precautions / Restrictions Precautions Precautions: Knee Precaution Booklet  Issued: Yes (comment) Precaution Comments: reviewed proper positioning Restrictions Weight Bearing Restrictions: Yes RLE Weight Bearing: Weight bearing as tolerated      Mobility  Bed Mobility Overal bed mobility: Needs Assistance Bed Mobility: Supine to Sit     Supine to sit: Supervision     General bed mobility comments: pt using UE's to move RLE to EOB but did not need physical assist    Transfers Overall transfer level: Needs assistance Equipment used: Rolling walker (2 wheels) Transfers: Sit to/from Stand Sit to Stand: Supervision           General transfer comment: had difficulty relaying hand placement to pt with tranfers but ended up doing better with tactile cues than verbal cues.    Ambulation/Gait Ambulation/Gait assistance: Supervision Gait Distance (Feet): 35 Feet Assistive device: Rolling walker (2 wheels) Gait Pattern/deviations: Step-through pattern, Decreased weight shift to right, Antalgic Gait velocity: decreased Gait velocity interpretation: <1.8 ft/sec, indicate of risk for recurrent falls   General Gait Details: at first pt placing RW way too far fwd but when corrected she began moving it hardly at all and leaning fwd over bar, needed manual adjustment from therapist. Will work on this more next session. No buckling of R knee  Stairs            Wheelchair Mobility    Modified Rankin (Stroke Patients Only)       Balance Overall balance assessment: Mild deficits observed, not formally tested                                           Pertinent Vitals/Pain Pain  Assessment Pain Assessment: 0-10 Pain Score: 7  Pain Location: right knee Pain Descriptors / Indicators: Aching, Sore Pain Intervention(s): Limited activity within patient's tolerance, Monitored during session    Home Living Family/patient expects to be discharged to:: Private residence Living Arrangements: Children;Spouse/significant other Available  Help at Discharge: Family;Available 24 hours/day (for 2 weeks) Type of Home: House Home Access: Stairs to enter Entrance Stairs-Rails: Right;Left Entrance Stairs-Number of Steps: 2 Alternate Level Stairs-Number of Steps: 6-landing-6 Home Layout: Two level Home Equipment: Conservation officer, nature (2 wheels);Cane - single point;BSC/3in1;Grab bars - tub/shower;Hand held shower head Additional Comments: daughter is taking 2 weeks off work to help out at home    Prior Function Prior Level of Function : Independent/Modified Independent             Mobility Comments: pt is the one who usually does the cooking and cleaning, laundry, etc in the home       Hand Dominance   Dominant Hand: Right    Extremity/Trunk Assessment   Upper Extremity Assessment Upper Extremity Assessment: Defer to OT evaluation    Lower Extremity Assessment Lower Extremity Assessment: RLE deficits/detail RLE Deficits / Details: hip flex 3+/5, knee ext 3/5, ankle WFL, knee ROM 10-70 RLE Sensation: WNL RLE Coordination: WNL    Cervical / Trunk Assessment Cervical / Trunk Assessment: Normal  Communication   Communication: Prefers language other than English (Hindi)  Cognition Arousal/Alertness: Awake/alert Behavior During Therapy: WFL for tasks assessed/performed Overall Cognitive Status: Difficult to assess                                 General Comments: daughter translating and sometimes having to repeat instructions, pt seems cognitively in tact but is impulsive        General Comments General comments (skin integrity, edema, etc.): VSS    Exercises Total Joint Exercises Ankle Circles/Pumps: AROM, Both, 10 reps, Seated Straight Leg Raises: AROM, Right, 10 reps, Supine Long Arc Quad: AROM, Right, 5 reps, Seated Knee Flexion: AROM, Right, 10 reps, Seated Goniometric ROM: 10-70   Assessment/Plan    PT Assessment Patient needs continued PT services  PT Problem List Decreased  strength;Decreased range of motion;Decreased activity tolerance;Decreased mobility;Decreased knowledge of use of DME;Decreased knowledge of precautions;Pain       PT Treatment Interventions Gait training;DME instruction;Stair training;Functional mobility training;Therapeutic activities;Therapeutic exercise;Patient/family education;Neuromuscular re-education    PT Goals (Current goals can be found in the Care Plan section)  Acute Rehab PT Goals Patient Stated Goal: return home PT Goal Formulation: With patient Time For Goal Achievement: 11/03/22 Potential to Achieve Goals: Good    Frequency 7X/week     Co-evaluation               AM-PAC PT "6 Clicks" Mobility  Outcome Measure Help needed turning from your back to your side while in a flat bed without using bedrails?: None Help needed moving from lying on your back to sitting on the side of a flat bed without using bedrails?: None Help needed moving to and from a bed to a chair (including a wheelchair)?: A Little Help needed standing up from a chair using your arms (e.g., wheelchair or bedside chair)?: A Little Help needed to walk in hospital room?: A Little Help needed climbing 3-5 steps with a railing? : A Little 6 Click Score: 20    End of Session Equipment Utilized During Treatment: Gait belt Activity Tolerance: Patient tolerated treatment  well Patient left: in chair;with call bell/phone within reach;with family/visitor present Nurse Communication: Mobility status PT Visit Diagnosis: Pain;Difficulty in walking, not elsewhere classified (R26.2) Pain - Right/Left: Right Pain - part of body: Knee    Time: 6734-1937 PT Time Calculation (min) (ACUTE ONLY): 22 min   Charges:   PT Evaluation $PT Eval Low Complexity: [place], PT  Acute Rehab Services Secure chat preferred Office [place] 10/27/2022, 11:25 AM

## 2022-10-27 NOTE — Evaluation (Signed)
Occupational Therapy Evaluation and Discharge Patient Details Name: Katie Burns MRN: [id number] DOB: 02-26-1946 Today's Date: 10/27/2022   History of Present Illness 77 y.o. female presents to [facility] hospital on 10/26/2022 for elective R TKA. PMH includes HTN, HLD, cataract extraction.   Clinical Impression   This 77 yo female admitted and underwent above presents to acute OT with all ADL education completed with pt and dtr (who will be with pt for the next 2 weeks). No further OT needs, we will sign off.      Recommendations for follow up therapy are one component of a multi-disciplinary discharge planning process, led by the attending physician.  Recommendations may be updated based on patient status, additional functional criteria and insurance authorization.   Follow Up Recommendations  No OT follow up     Assistance Recommended at Discharge PRN  Patient can return home with the following A little help with walking and/or transfers;A little help with bathing/dressing/bathroom;Assistance with cooking/housework;Assist for transportation;Help with stairs or ramp for entrance    Functional Status Assessment  Patient has had a recent decline in their functional status and demonstrates the ability to make significant improvements in function in a reasonable and predictable amount of time. (without further need for skilled OT, all education compeleted with pt with dtr translating)  Equipment Recommendations  Tub/shower bench       Precautions / Restrictions Precautions Precautions: Knee Restrictions Weight Bearing Restrictions: No RLE Weight Bearing: Weight bearing as tolerated      Mobility Bed Mobility               General bed mobility comments: up in recliner upon arrival--just finishing up with PT    Transfers Overall transfer level: Needs assistance Equipment used: Rolling walker (2 wheels) Transfers: Sit to/from Stand Sit to Stand: Min guard                   Balance Overall balance assessment: Mild deficits observed, not formally tested                                         ADL either performed or assessed with clinical judgement   ADL Overall ADL's : Needs assistance/impaired Eating/Feeding: Independent;Sitting   Grooming: Set up;Sitting   Upper Body Bathing: Set up;Sitting   Lower Body Bathing: Min guard;Sit to/from stand   Upper Body Dressing : Set up;Sitting   Lower Body Dressing: Min guard;Sit to/from stand   Toilet Transfer: Min guard;Ambulation   Toileting- Clothing Manipulation and Hygiene: Min guard;Sit to/from Nurse, children's Details (indicate cue type and reason): Demonstrated to dtr how pt would use a tub bench at home (they will remove shower doors so she can use tub bench at home)         Vision Patient Visual Report: No change from baseline              Pertinent Vitals/Pain Pain Assessment Pain Assessment: 0-10 Pain Score: 7  Pain Location: right knee Pain Descriptors / Indicators: Aching, Sore Pain Intervention(s): Ice applied, Limited activity within patient's tolerance, Monitored during session, Repositioned     Hand Dominance Right   Extremity/Trunk Assessment Upper Extremity Assessment Upper Extremity Assessment: Overall WFL for tasks assessed           Communication Communication Communication: Prefers language other than English (Hindi)   Cognition Arousal/Alertness: Awake/alert  Overall Cognitive Status: Within Functional Limits for tasks assessed                                                  Home Living Family/patient expects to be discharged to:: Private residence Living Arrangements: Children;Spouse/significant other Available Help at Discharge: Family;Available 24 hours/day (for 2 weeks) Type of Home: House Home Access: Stairs to enter [employer] of Steps: 2 Entrance Stairs-Rails: Right;Left Home  Layout: Two level Alternate Level Stairs-Number of Steps: 6-landing-6 Alternate Level Stairs-Rails: Right Bathroom Shower/Tub: Tub/shower unit;Door   [employer] Toilet: Standard     Home Equipment: Conservation officer, nature (2 wheels);Cane - single point;BSC/3in1;Grab bars - tub/shower;Hand held shower head   Additional Comments: daughter is taking 2 weeks off work to help out at home      Prior Functioning/Environment Prior Level of Function : Independent/Modified Independent             Mobility Comments: pt is the one who usually does the cooking and cleaning, laundry, etc in the home          OT Problem List: Decreased range of motion;Impaired balance (sitting and/or standing);Pain         OT Goals(Current goals can be found in the care plan section) Acute Rehab OT Goals Patient Stated Goal: to go home today         AM-PAC OT "6 Clicks" Daily Activity     Outcome Measure Help from another person eating meals?: None Help from another person taking care of personal grooming?: A Little Help from another person toileting, which includes using toliet, bedpan, or urinal?: A Little Help from another person bathing (including washing, rinsing, drying)?: A Little Help from another person to put on and taking off regular upper body clothing?: A Little Help from another person to put on and taking off regular lower body clothing?: A Little 6 Click Score: 19   End of Session Equipment Utilized During Treatment: Rolling walker (2 wheels)  Activity Tolerance: Patient tolerated treatment well Patient left: in chair;with call bell/phone within reach  OT Visit Diagnosis: Unsteadiness on feet (R26.81);Other abnormalities of gait and mobility (R26.89);Pain Pain - Right/Left: Right Pain - part of body: Knee                Time: 7425-9563 OT Time Calculation (min): 33 min Charges:  OT General Charges $OT Visit: 1 Visit OT Evaluation $OT Eval Moderate Complexity: 1 Mod OT  Treatments $Self Care/Home Management : 8-22 mins  [place], OTR/L Acute Rehab Services [facility] [place] [phone] Office [phone]    Almon Register 10/27/2022, 3:42 PM

## 2022-10-27 NOTE — Discharge Summary (Signed)
Patient ID: Katie Burns MRN: [id number] DOB/AGE: 76-Aug-1947 76 y.o.  Admit date: 10/26/2022 Discharge date: 10/27/2022  Admission Diagnoses:  Principal Problem:   Primary osteoarthritis of right knee Active Problems:   Status post total right knee replacement   Discharge Diagnoses:  Same  Past Medical History:  Diagnosis Date   Hyperlipidemia    Hypertension     Surgeries: Procedure(s): RIGHT TOTAL KNEE ARTHROPLASTY on 10/26/2022   Consultants:   Discharged Condition: Improved  Hospital Course: Katie Burns is an 77 y.o. female who was admitted 10/26/2022 for operative treatment ofPrimary osteoarthritis of right knee. Patient has severe unremitting pain that affects sleep, daily activities, and work/hobbies. After pre-op clearance the patient was taken to the operating room on 10/26/2022 and underwent  Procedure(s): RIGHT TOTAL KNEE ARTHROPLASTY.    Patient was given perioperative antibiotics:  Anti-infectives (From admission, onward)    Start     Dose/Rate Route Frequency Ordered Stop   10/26/22 1600  ceFAZolin (ANCEF) IVPB 2g/100 mL premix        2 g 200 mL/hr over 30 Minutes Intravenous Every 6 hours 10/26/22 1545 10/27/22 0340   10/26/22 1035  vancomycin (VANCOCIN) powder  Status:  Discontinued          As needed 10/26/22 1035 10/26/22 1125   10/26/22 0800  ceFAZolin (ANCEF) IVPB 2g/100 mL premix        2 g 200 mL/hr over 30 Minutes Intravenous On call to O.R. 10/26/22 6734 10/26/22 0930        Patient was given sequential compression devices, early ambulation, and chemoprophylaxis to prevent DVT.  Patient benefited maximally from hospital stay and there were no complications.    Recent vital signs: Patient Vitals for the past 24 hrs:  BP Temp Temp src Pulse Resp SpO2  10/27/22 0840 (!) 93/55 98.7 F (37.1 C) -- 66 17 91 %  10/26/22 2019 115/64 (!) 97.5 F (36.4 C) -- 61 20 94 %  10/26/22 1730 (!) 91/59 97.9 F (36.6 C) Oral 75 18 100 %  10/26/22 1700 (!)  117/49 -- -- 64 16 97 %  10/26/22 1600 123/62 -- -- 60 15 99 %  10/26/22 1500 (!) 111/59 -- -- 73 17 100 %  10/26/22 1430 126/74 -- -- 72 15 100 %  10/26/22 1400 134/73 -- -- 60 15 100 %  10/26/22 1345 (!) 140/70 -- -- 69 15 98 %  10/26/22 1330 124/79 -- -- 71 (!) 21 100 %  10/26/22 1315 137/70 -- -- 73 19 100 %  10/26/22 1300 127/73 -- -- 68 17 100 %  10/26/22 1245 133/81 -- -- 60 (!) 21 100 %  10/26/22 1230 125/63 -- -- (!) 57 15 100 %  10/26/22 1215 132/65 -- -- 75 17 97 %  10/26/22 1200 115/62 -- -- 66 14 96 %  10/26/22 1145 129/62 -- -- 68 16 97 %  10/26/22 1130 117/71 98 F (36.7 C) -- 73 (!) 21 96 %     Recent laboratory studies:  Recent Labs    10/27/22 0502  WBC 10.6*  HGB 10.4*  HCT 30.4*  PLT 169     Discharge Medications:   Allergies as of 10/27/2022       Reactions   Beef-derived Products    Chicken Protein    Fish-derived Products    Pork-derived Products         Medication List     STOP taking these medications    acetaminophen 650 MG  CR tablet Commonly known as: TYLENOL   meloxicam 7.5 MG tablet Commonly known as: MOBIC       TAKE these medications    amLODipine 5 MG tablet Commonly known as: NORVASC Take 1 tablet (5 mg total) by mouth daily.   aspirin EC 81 MG tablet Take 1 tablet (81 mg total) by mouth 2 (two) times daily. To be taken after surgery to prevent blood clots   atorvastatin 20 MG tablet Commonly known as: LIPITOR TAKE 1 TABLET(20 MG) BY MOUTH DAILY   Blood Pressure Monitor Devi Use as directed to check home blood pressure 2-3 times a week   CENTRUM SILVER ADULT 50+ PO Take 1 tablet by mouth daily.   cycloSPORINE 0.05 % ophthalmic emulsion Commonly known as: RESTASIS Place 1 drop into both eyes daily as needed (dry/irritated eyes).   diclofenac Sodium 1 % Gel Commonly known as: Voltaren Apply 2 g topically 4 (four) times daily. What changed: when to take this   docusate sodium 100 MG capsule Commonly known  as: Colace Take 1 capsule (100 mg total) by mouth daily as needed.   ferrous sulfate 325 (65 FE) MG tablet Commonly known as: FeroSul Take one tab PO Q Mon/Wed and Frid. What changed:  how much to take how to take this when to take this additional instructions   furosemide 20 MG tablet Commonly known as: LASIX TAKE 1 TABLET(20 MG) BY MOUTH DAILY   MAGNESIUM PO Take 1 tablet by mouth daily.   methocarbamol 750 MG tablet Commonly known as: Robaxin-750 Take 1 tablet (750 mg total) by mouth 2 (two) times daily as needed for muscle spasms.   metoprolol tartrate 50 MG tablet Commonly known as: LOPRESSOR TAKE 1 TABLET(50 MG) BY MOUTH DAILY   ondansetron 4 MG tablet Commonly known as: Zofran Take 1 tablet (4 mg total) by mouth every 8 (eight) hours as needed for nausea or vomiting.   oxyCODONE-acetaminophen 5-325 MG tablet Commonly known as: Percocet Take 1-2 tablets by mouth every 6 (six) hours as needed. To be taken after surgery   VITAMIN D3 PO Take 1 tablet by mouth daily.               Durable Medical Equipment  (From admission, onward)           Start     Ordered   10/26/22 1740  DME Walker rolling  Once       Question Answer Comment  Walker: With 5 Inch Wheels   Patient needs a walker to treat with the following condition Status post left partial knee replacement      10/26/22 1739   10/26/22 1740  DME 3 n 1  Once        10/26/22 1739   10/26/22 1740  DME Bedside commode  Once       Question:  Patient needs a bedside commode to treat with the following condition  Answer:  Status post left partial knee replacement   10/26/22 1739            Diagnostic Studies: DG Knee Right Port  Result Date: 10/26/2022 CLINICAL DATA:  Postoperative imaging. EXAM: PORTABLE RIGHT KNEE - 1-2 VIEW COMPARISON:  Right knee radiographs 08/19/2022 FINDINGS: Status post interval total right knee arthroplasty. No perihardware lucency is seen to indicate hardware failure  or loosening. Small joint effusion. Expected intra-articular and subcutaneous postoperative air. Moderate anterior knee soft tissue swelling. No acute fracture or dislocation. IMPRESSION: Status post total right knee  arthroplasty without evidence of hardware failure. Electronically Signed   By: Yvonne Kendall M.D.   On: 10/26/2022 12:32    Disposition: Discharge disposition: 01-Home or Self Care          Follow-up Information     Leandrew Koyanagi, MD Follow up in 2 week(s).   Specialty: Orthopedic Surgery Why: For suture removal, For wound re-check Contact information: [place] [address] [phone]         Ladell Pier, MD Follow up.   Specialty: Internal Medicine Contact information: [address] [place] [address] [phone]         [facility] [place] Follow up.   Specialty: [place] Why: home health services will be provided bt [facility], start of care within 48 hours post discharge Contact information: [address] [place] [address] [phone]                  Signed: Aundra Dubin 10/27/2022, 9:45 AM

## 2022-10-28 ENCOUNTER — Telehealth: Payer: Self-pay | Admitting: Emergency Medicine

## 2022-10-28 ENCOUNTER — Telehealth: Payer: Self-pay

## 2022-10-28 NOTE — Telephone Encounter (Signed)
Copied from [place] [id number]. Topic: General - Other >> Oct 28, 2022  3:26 PM Cyndi Bender wrote: Reason for CRM: Pt stated she was returning the call from a missed call she had. Pt requests call back.

## 2022-10-28 NOTE — Telephone Encounter (Signed)
Transition Care Management Follow-up Telephone Call  Call completed with patient's son in law, Katie Burns. Date of discharge and from where: 10/27/2022, [facility] How have you been since you were released from the hospital? He said she is doing fine, only some pain.  Any questions or concerns? No  Items Reviewed: Did the pt receive and understand the discharge instructions provided? Yes  Medications obtained and verified? Yes - she has all of her medications and he did not have any questions about the med regime  Other? No  Any new allergies since your discharge? No  Dietary orders reviewed? No Do you have support at home? Yes - her husband and her  daughter and son -in-law  Home Care and Equipment/Supplies: Were home health services ordered? yes If so, what is the name of the agency? [facility]  Has the agency set up a time to come to the patient's home? no Were any new equipment or medical supplies ordered?  Yes: CPM, 3:1 commode, RW, ice machine for knee.  These were delivered to her home prior to her surgery What is the name of the medical supply agency? The orthopedic office arranged the equipment delivery.  Were you able to get the supplies/equipment? yes Do you have any questions related to the use of the equipment or supplies? No  Functional Questionnaire: (I = Independent and D = Dependent) ADLs: ambulates with RW. Daughter provides any needed assistance with ADLS and she oversees the patient's medication regime     Follow up appointments reviewed:  PCP Hospital f/u appt confirmed?  Not scheduled to see Dr Wynetta Emery until 01/21/2023.    [facility] f/u appt confirmed? Yes  Scheduled to see orthopedics- 11/10/2022.   Are transportation arrangements needed? No  If their condition worsens, is the pt aware to call PCP or go to the Emergency Dept.? Yes Was the patient provided with contact information for the PCP's office or ED? Yes Was to pt encouraged to call  back with questions or concerns? Yes

## 2022-10-28 NOTE — Telephone Encounter (Signed)
I returned call to patient, her daughter answered and I explained to her that I have already spoken to her husband. She did not have any questions and I thanked her for returning the call

## 2022-11-10 ENCOUNTER — Ambulatory Visit (INDEPENDENT_AMBULATORY_CARE_PROVIDER_SITE_OTHER): Payer: Medicare Other | Admitting: Physician Assistant

## 2022-11-10 DIAGNOSIS — M1711 Unilateral primary osteoarthritis, right knee: Secondary | ICD-10-CM

## 2022-11-10 MED ORDER — METHOCARBAMOL 750 MG PO TABS: 750.0000 mg | ORAL_TABLET | Freq: Two times a day (BID) | ORAL | 2 refills | Status: DC | PRN

## 2022-11-10 MED ORDER — OXYCODONE-ACETAMINOPHEN 5-325 MG PO TABS: 1.0000 | ORAL_TABLET | Freq: Three times a day (TID) | ORAL | 0 refills | Status: DC | PRN

## 2022-11-10 NOTE — Progress Notes (Signed)
   Post-Op Visit Note   Patient: Katie Burns           Date of Birth: Oct 17, 1945           MRN: [id number] Visit Date: 11/10/2022 PCP: Ladell Pier, MD   Assessment & Plan:  Chief Complaint:  Chief Complaint  Patient presents with   Right Knee - Routine Post Op   Visit Diagnoses:  1. Unilateral primary osteoarthritis, right knee     Plan: Patient is a pleasant 77 year old female who comes in today 2 weeks status post right total knee replacement 10/26/2022.  She has been doing well.  She has been getting physical therapy and is ambulating with a walker.  She has been compliant taking a baby aspirin twice daily for DVT prophylaxis.  Examination of her right knee reveals a well-healed surgical incision with nylon sutures in place.  No evidence of infection or cellulitis.  Calves are soft nontender.  She is neurovascular intact distally.  Today, sutures were removed and Steri-Strips applied.  I have sent in a referral for outpatient physical therapy.  I refilled her Percocet and Robaxin.  She will continue with a baby aspirin twice daily for DVT prophylaxis.  Dental prophylaxis reinforced.  Follow-up in 4 weeks for repeat evaluation and 2 view x-rays of the right knee.  Call with concerns or questions.  Follow-Up Instructions: Return in about 4 weeks (around 12/08/2022).   Orders:  No orders of the defined types were placed in this encounter.  Meds ordered this encounter  Medications   oxyCODONE-acetaminophen (PERCOCET) 5-325 MG tablet    Sig: Take 1-2 tablets by mouth every 8 (eight) hours as needed. To be taken after surgery    Dispense:  40 tablet    Refill:  0   methocarbamol (ROBAXIN-750) 750 MG tablet    Sig: Take 1 tablet (750 mg total) by mouth 2 (two) times daily as needed for muscle spasms.    Dispense:  20 tablet    Refill:  2    Imaging: No new imaging  PMFS History: Patient Active Problem List   Diagnosis Date Noted   Primary osteoarthritis of right knee  10/26/2022   Status post total right knee replacement 10/26/2022   Prediabetes 11/19/2019   Essential hypertension 10/18/2017   Hyperlipidemia 10/18/2017   Primary osteoarthritis of both knees 10/18/2017   Obesity (BMI 30.0-34.9) 10/18/2017   Past Medical History:  Diagnosis Date   Hyperlipidemia    Hypertension     Family History  Problem Relation Age of Onset   Hypertension Daughter    Breast cancer Neg Hx     Past Surgical History:  Procedure Laterality Date   CATARACT EXTRACTION, BILATERAL Bilateral 2017   with lens implant   COLONOSCOPY     EYE SURGERY Bilateral 2015   cataract extractionwith lens implant   TOTAL KNEE ARTHROPLASTY Right 10/26/2022   Procedure: RIGHT TOTAL KNEE ARTHROPLASTY;  Surgeon: Leandrew Koyanagi, MD;  Location: [facility];  Service: Orthopedics;  Laterality: Right;   Social History   Occupational History   Occupation: unempolyed  Tobacco Use   Smoking status: Never   Smokeless tobacco: Never  Vaping Use   Vaping Use: Never used  Substance and Sexual Activity   Alcohol use: No   Drug use: No   Sexual activity: Not on file

## 2022-11-18 ENCOUNTER — Ambulatory Visit: Payer: Medicare Other | Attending: Physician Assistant

## 2022-11-18 NOTE — Therapy (Deleted)
OUTPATIENT PHYSICAL THERAPY LOWER EXTREMITY EVALUATION   Patient Name: Katie Burns MRN: [id number] DOB:Oct 11, 1945, 77 y.o., female Today's Date: 11/18/2022  END OF SESSION:   Past Medical History:  Diagnosis Date   Hyperlipidemia    Hypertension    Past Surgical History:  Procedure Laterality Date   CATARACT EXTRACTION, BILATERAL Bilateral 2017   with lens implant   COLONOSCOPY     EYE SURGERY Bilateral 2015   cataract extractionwith lens implant   TOTAL KNEE ARTHROPLASTY Right 10/26/2022   Procedure: RIGHT TOTAL KNEE ARTHROPLASTY;  Surgeon: Leandrew Koyanagi, MD;  Location: [facility];  Service: Orthopedics;  Laterality: Right;   Patient Active Problem List   Diagnosis Date Noted   Primary osteoarthritis of right knee 10/26/2022   Status post total right knee replacement 10/26/2022   Prediabetes 11/19/2019   Essential hypertension 10/18/2017   Hyperlipidemia 10/18/2017   Primary osteoarthritis of both knees 10/18/2017   Obesity (BMI 30.0-34.9) 10/18/2017    PCP: Ladell Pier, MD   REFERRING PROVIDER:  Aundra Dubin, PA-C   REFERRING DIAG: Unilateral primary osteoarthritis, right knee   THERAPY DIAG: Unilateral primary osteoarthritis, right knee     Rationale for Evaluation and Treatment: Rehabilitation  ONSET DATE: 10/26/22  SUBJECTIVE:   SUBJECTIVE STATEMENT: ***  PERTINENT HISTORY: Patient is a pleasant 77 year old female who comes in today 2 weeks status post right total knee replacement 10/26/2022. She has been doing well. She has been getting physical therapy and is ambulating with a walker. She has been compliant taking a baby aspirin twice daily for DVT prophylaxis. Examination of her right knee reveals a well-healed surgical incision with nylon sutures in place. No evidence of infection or cellulitis. Calves are soft nontender. She is neurovascular intact distally. Today, sutures were removed and Steri-Strips applied. I have sent in a referral for  outpatient physical therapy. I refilled her Percocet and Robaxin. She will continue with a baby aspirin twice daily for DVT prophylaxis. Dental prophylaxis reinforced. Follow-up in 4 weeks for repeat evaluation and 2 view x-rays of the right knee. Call with concerns or questions. PAIN:  Are you having pain? {OPRCPAIN:27236}  PRECAUTIONS: None  WEIGHT BEARING RESTRICTIONS: No  FALLS:  Has patient fallen in last 6 months? No  LIVING ENVIRONMENT: Lives with: lives with their family Lives in: {Lives in:25570} Stairs: {opstairs:27293} Has following equipment at home: {Assistive devices:23999}  OCCUPATION: retired  PLOF: Independent  PATIENT GOALS: ***  NEXT MD VISIT: Return in about 4 weeks (around 12/08/2022).   OBJECTIVE:   DIAGNOSTIC FINDINGS: none  PATIENT SURVEYS:  PSFS    MUSCLE LENGTH: Hamstrings: Right *** deg; Left *** deg Marcello Moores test: Right *** deg; Left *** deg  POSTURE: {posture:25561}  PALPATION: ***  LOWER EXTREMITY ROM:  {AROM/PROM:27142} ROM Right eval Left eval  Hip flexion    Hip extension    Hip abduction    Hip adduction    Hip internal rotation    Hip external rotation    Knee flexion    Knee extension    Ankle dorsiflexion    Ankle plantarflexion    Ankle inversion    Ankle eversion     (Blank rows = not tested)  LOWER EXTREMITY MMT:  MMT Right eval Left eval  Hip flexion    Hip extension    Hip abduction    Hip adduction    Hip internal rotation    Hip external rotation    Knee flexion    Knee extension  Ankle dorsiflexion    Ankle plantarflexion    Ankle inversion    Ankle eversion     (Blank rows = not tested)  LOWER EXTREMITY SPECIAL TESTS: N/A  FUNCTIONAL TESTS:  30 seconds chair stand test  GAIT: Distance walked: 41f x2 Assistive device utilized: WEnvironmental consultant- 2 wheeled Level of assistance: Modified independence Comments: ***   TODAY'S TREATMENT:                                                                                                                               DATE: ***    PATIENT EDUCATION:  Education details: Discussed eval findings, rehab rationale and POC and patient is in agreement  Person educated: Patient Education method: Explanation Education comprehension: verbalized understanding and needs further education  HOME EXERCISE PROGRAM: ***  ASSESSMENT:  CLINICAL IMPRESSION: Patient is a *** y.o. *** who was seen today for physical therapy evaluation and treatment for ***.   OBJECTIVE IMPAIRMENTS: {opptimpairments:25111}.   ACTIVITY LIMITATIONS: {activitylimitations:27494}  PARTICIPATION LIMITATIONS: {participationrestrictions:25113}  PERSONAL FACTORS: {Personal factors:25162} are also affecting patient's functional outcome.   REHAB POTENTIAL: {rehabpotential:25112}  CLINICAL DECISION MAKING: {clinical decision making:25114}  EVALUATION COMPLEXITY: {Evaluation complexity:25115}   GOALS: Goals reviewed with patient? {yes/no:20286}  SHORT TERM GOALS: Target date: *** *** Baseline: Goal status: {GOALSTATUS:25110}  2.  *** Baseline:  Goal status: {GOALSTATUS:25110}  3.  *** Baseline:  Goal status: {GOALSTATUS:25110}  4.  *** Baseline:  Goal status: {GOALSTATUS:25110}  5.  *** Baseline:  Goal status: {GOALSTATUS:25110}  6.  *** Baseline:  Goal status: {GOALSTATUS:25110}  LONG TERM GOALS: Target date: ***  *** Baseline:  Goal status: {GOALSTATUS:25110}  2.  *** Baseline:  Goal status: {GOALSTATUS:25110}  3.  *** Baseline:  Goal status: {GOALSTATUS:25110}  4.  *** Baseline:  Goal status: {GOALSTATUS:25110}  5.  *** Baseline:  Goal status: {GOALSTATUS:25110}  6.  *** Baseline:  Goal status: {GOALSTATUS:25110}   PLAN:  PT FREQUENCY: {rehab frequency:25116}  PT DURATION: {rehab duration:25117}  PLANNED INTERVENTIONS: {rehab planned interventions:25118::"Therapeutic exercises","Therapeutic activity","Neuromuscular  re-education","Balance training","Gait training","Patient/Family education","Self Care","Joint mobilization"}  PLAN FOR NEXT SESSION: ***   JLanice Shirts PT 11/18/2022, 7:45 AM

## 2022-11-25 ENCOUNTER — Ambulatory Visit: Payer: Medicare Other

## 2022-11-30 ENCOUNTER — Ambulatory Visit: Payer: Medicare Other | Attending: Physician Assistant

## 2022-11-30 ENCOUNTER — Other Ambulatory Visit: Payer: Self-pay

## 2022-11-30 DIAGNOSIS — Z96651 Presence of right artificial knee joint: Secondary | ICD-10-CM | POA: Insufficient documentation

## 2022-11-30 DIAGNOSIS — R2689 Other abnormalities of gait and mobility: Secondary | ICD-10-CM | POA: Insufficient documentation

## 2022-11-30 DIAGNOSIS — M6281 Muscle weakness (generalized): Secondary | ICD-10-CM | POA: Insufficient documentation

## 2022-11-30 DIAGNOSIS — M1711 Unilateral primary osteoarthritis, right knee: Secondary | ICD-10-CM | POA: Insufficient documentation

## 2022-11-30 NOTE — Therapy (Unsigned)
OUTPATIENT PHYSICAL THERAPY LOWER EXTREMITY EVALUATION   Patient Name: Katie Burns MRN: [id number] DOB:November 15, 1945, 77 y.o., female Today's Date: 12/01/2022  END OF SESSION:  PT End of Session - 12/01/22 1059     Visit Number 1    Number of Visits 12    Date for PT Re-Evaluation 01/26/23    Authorization Type MCR    PT Start Time 1700    PT Stop Time 1750    PT Time Calculation (min) 50 min    Activity Tolerance Patient tolerated treatment well    Behavior During Therapy WFL for tasks assessed/performed             Past Medical History:  Diagnosis Date   Hyperlipidemia    Hypertension    Past Surgical History:  Procedure Laterality Date   CATARACT EXTRACTION, BILATERAL Bilateral 2017   with lens implant   COLONOSCOPY     EYE SURGERY Bilateral 2015   cataract extractionwith lens implant   TOTAL KNEE ARTHROPLASTY Right 10/26/2022   Procedure: RIGHT TOTAL KNEE ARTHROPLASTY;  Surgeon: Leandrew Koyanagi, MD;  Location: [facility];  Service: Orthopedics;  Laterality: Right;   Patient Active Problem List   Diagnosis Date Noted   Primary osteoarthritis of right knee 10/26/2022   Status post total right knee replacement 10/26/2022   Prediabetes 11/19/2019   Essential hypertension 10/18/2017   Hyperlipidemia 10/18/2017   Primary osteoarthritis of both knees 10/18/2017   Obesity (BMI 30.0-34.9) 10/18/2017    PCP: Ladell Pier, MD   REFERRING PROVIDER: Aundra Dubin, PA-C  REFERRING DIAG: Unilateral primary osteoarthritis, right knee   THERAPY DIAG: Ladell Pier, MD    Rationale for Evaluation and Treatment: Rehabilitation  ONSET DATE: 10/26/22  SUBJECTIVE:   SUBJECTIVE STATEMENT: R knee soreness following TKA has interpreter to translate   PERTINENT HISTORY: Patient is a pleasant 77 year old female who comes in today 2 weeks status post right total knee replacement 10/26/2022. She has been doing well. She has been getting physical therapy and is  ambulating with a walker. She has been compliant taking a baby aspirin twice daily for DVT prophylaxis. Examination of her right knee reveals a well-healed surgical incision with nylon sutures in place. No evidence of infection or cellulitis. Calves are soft nontender. She is neurovascular intact distally. Today, sutures were removed and Steri-Strips applied. I have sent in a referral for outpatient physical therapy. I refilled her Percocet and Robaxin. She will continue with a baby aspirin twice daily for DVT prophylaxis. Dental prophylaxis reinforced. Follow-up in 4 weeks for repeat evaluation and 2 view x-rays of the right knee. Call with concerns or questions. PAIN:  Are you having pain? Yes: NPRS scale: 5/10 Pain location: R knee Pain description: ache Aggravating factors: walking and exercise Relieving factors: rest meds  PRECAUTIONS: None  WEIGHT BEARING RESTRICTIONS: No  FALLS:  Has patient fallen in last 6 months? No  LIVING ENVIRONMENT: Lives with: lives with their family Lives in: House/apartment Stairs:  yes Has following equipment at home: Single point cane  OCCUPATION: retired  PLOF: Independent  PATIENT GOALS: To regain my knee function and decrease pain  NEXT MD VISIT: 4 weeks  OBJECTIVE:   DIAGNOSTIC FINDINGS: see imaging  PATIENT SURVEYS:  Patient-specific activity scoring scheme (Point to one number): '0 1 2 3 4 5 6 7 8 9 '$ 10 (Date and Score) Activity Initial  Activity Eval     standing  6    stairs  6  Arising from a chair 6    Additional Additional Total score = sum of the activity scores/number of activities Minimum detectable change (90%CI) for average score = 2 points Minimum detectable change (90%CI) for single activity score = 3 points PSFS developed by: Jillene Bucks., & Binkley, J. (1995). Assessing disability and change on individual  patients: a report of a patient specific measure. Physiotherapy [place], 47,  [phone]. Reproduced with the permission of the authors  Score: 18/30 60% functional    MUSCLE LENGTH: Hamstrings: NT   POSTURE:  flexed R knee with gait  PALPATION: Unremarkable   LOWER EXTREMITY ROM:  A/PROM Right eval Left eval  Hip flexion    Hip extension    Hip abduction    Hip adduction    Hip internal rotation    Hip external rotation    Knee flexion 109/112d 125  Knee extension -4/-2d 0  Ankle dorsiflexion    Ankle plantarflexion    Ankle inversion    Ankle eversion     (Blank rows = not tested)  LOWER EXTREMITY MMT:  MMT Right eval Left eval  Hip flexion 4- 4  Hip extension 4- 4  Hip abduction 4- 4  Hip adduction    Hip internal rotation    Hip external rotation    Knee flexion 4- 4  Knee extension 3+ 4  Ankle dorsiflexion    Ankle plantarflexion 4- 4  Ankle inversion    Ankle eversion     (Blank rows = not tested)  LOWER EXTREMITY SPECIAL TESTS:  deferred  FUNCTIONAL TESTS:  5 times sit to stand: 26s arms crossed  GAIT: Distance walked: 52f x2 Assistive device utilized: Single point cane Level of assistance: Complete Independence Comments: antalgic gait   TODAY'S TREATMENT:                                                                                                                              DATE: Eval and HEP     PATIENT EDUCATION:  Education details: Discussed eval findings, rehab rationale and POC and patient is in agreement  Person educated: Patient and Child(ren) Education method: Explanation and Demonstration Education comprehension: verbalized understanding and needs further education  HOME EXERCISE PROGRAM: Access Code: [email or web address]: [email or web address] Date: 11/30/2022 Prepared by: JSharlynn Oliphant Exercises - Active Straight Leg Raise with Quad Set  - 3 x daily - 5 x weekly - 1 sets - 10 reps - Supine Knee Extension Strengthening  - 3 x daily - 5 x weekly - 1 sets - 10 reps - Seated Long Arc  Quad  - 3 x daily - 5 x weekly - 1 sets - 10 reps - Standing Heel Raise with Chair Support  - 3 x daily - 5 x weekly - 3 sets - 10 reps - Supine Heel Slides  - 3 x daily - 5 x weekly - 3 sets - 10 reps  ASSESSMENT:  CLINICAL IMPRESSION: Patient is a 77 y.o. female who was seen today for physical therapy evaluation and treatment s/p R TKA. Pain levels are controlled, initial AROM is good in flex and extension.  Mild strength deficits note din quads and hamstrings.  Patient ambulates with SPC using antalgic gait pattern.  Incision well healed w/o warmth, redness or drainage   OBJECTIVE IMPAIRMENTS: Abnormal gait, decreased activity tolerance, decreased balance, decreased endurance, decreased knowledge of condition, decreased knowledge of use of DME, decreased mobility, difficulty walking, decreased ROM, decreased strength, impaired perceived functional ability, improper body mechanics, postural dysfunction, and pain.   ACTIVITY LIMITATIONS: carrying, lifting, sitting, standing, squatting, and stairs  PERSONAL FACTORS: Age, Fitness, Social background, and 1 comorbidity: OA  are also affecting patient's functional outcome.   REHAB POTENTIAL: Good  CLINICAL DECISION MAKING: Stable/uncomplicated  EVALUATION COMPLEXITY: Low   GOALS: Goals reviewed with patient? No  SHORT TERM GOALS: Target date: 12/21/2022   Patient to demonstrate independence in HEP  Baseline:[id number] Goal status: INITIAL  2.  262f ambulation with SPC and step through pattern Baseline: 765fx2 with SPC and step through pattern Goal status: INITIAL    LONG TERM GOALS: Target date: 01/12/2023    Decrease 5x STS to 20s arms crossed Baseline: 26s arms crossed Goal status: INITIAL  2.  Increase PSFS to 24/30 80% Baseline: 18/30 60% Goal status: INITIAL  3.  Increase RLE strength to 4/5 in all deficit regions Baseline:  MMT Right eval Left eval  Hip flexion 4- 4  Hip extension 4- 4  Hip abduction 4- 4  Hip  adduction    Hip internal rotation    Hip external rotation    Knee flexion 4- 4  Knee extension 3+ 4  Ankle dorsiflexion    Ankle plantarflexion 4- 4   Goal status: INITIAL  4.  Increase AROM R knee to 115d flexion and -2d extension Baseline:   R L  Knee flexion 109/112d 125  Knee extension -4/-2d 0   Goal status: INITIAL  5.  Decrease pain levels to 4/10 at worst Baseline: 5/10 at worst Goal status: INITIAL     PLAN:  PT FREQUENCY: 2x/week  PT DURATION: 6 weeks  PLANNED INTERVENTIONS: Therapeutic exercises, Therapeutic activity, Neuromuscular re-education, Balance training, Gait training, Patient/Family education, Self Care, Joint mobilization, Stair training, DME instructions, Dry Needling, Electrical stimulation, Cryotherapy, Moist heat, scar mobilization, Manual therapy, and Re-evaluation  PLAN FOR NEXT SESSION: HEP review  and f/u, aerobic work, R knee A/PROM, RLE strengthening focus on quad, gait and balance training   JeLanice ShirtsPT 12/01/2022, 11:00 AM

## 2022-12-02 ENCOUNTER — Ambulatory Visit: Payer: Medicare Other

## 2022-12-02 DIAGNOSIS — R2689 Other abnormalities of gait and mobility: Secondary | ICD-10-CM

## 2022-12-02 DIAGNOSIS — Z96651 Presence of right artificial knee joint: Secondary | ICD-10-CM | POA: Diagnosis not present

## 2022-12-02 DIAGNOSIS — M6281 Muscle weakness (generalized): Secondary | ICD-10-CM

## 2022-12-02 NOTE — Therapy (Signed)
OUTPATIENT PHYSICAL THERAPY TREATMENT NOTE   Patient Name: Katie Burns MRN: [id number] DOB:27-Jan-1946, 77 y.o., female Today's Date: 12/02/2022  PCP: Ladell Pier, MD   REFERRING PROVIDER: Aundra Dubin, PA-C   END OF SESSION:   PT End of Session - 12/02/22 1742     Visit Number 2    Number of Visits 12    Date for PT Re-Evaluation 01/26/23    Authorization Type MCR    PT Start Time 1745    PT Stop Time 1825    PT Time Calculation (min) 40 min    Activity Tolerance Patient tolerated treatment well    Behavior During Therapy WFL for tasks assessed/performed             Past Medical History:  Diagnosis Date   Hyperlipidemia    Hypertension    Past Surgical History:  Procedure Laterality Date   CATARACT EXTRACTION, BILATERAL Bilateral 2017   with lens implant   COLONOSCOPY     EYE SURGERY Bilateral 2015   cataract extractionwith lens implant   TOTAL KNEE ARTHROPLASTY Right 10/26/2022   Procedure: RIGHT TOTAL KNEE ARTHROPLASTY;  Surgeon: Leandrew Koyanagi, MD;  Location: [facility];  Service: Orthopedics;  Laterality: Right;   Patient Active Problem List   Diagnosis Date Noted   Primary osteoarthritis of right knee 10/26/2022   Status post total right knee replacement 10/26/2022   Prediabetes 11/19/2019   Essential hypertension 10/18/2017   Hyperlipidemia 10/18/2017   Primary osteoarthritis of both knees 10/18/2017   Obesity (BMI 30.0-34.9) 10/18/2017    REFERRING DIAG: Unilateral primary osteoarthritis, right knee   THERAPY DIAG:  Total knee replacement status, right  Other abnormalities of gait and mobility  Muscle weakness (generalized)  Rationale for Evaluation and Treatment Rehabilitation  PERTINENT HISTORY: Patient is a pleasant 77 year old female who comes in today 2 weeks status post right total knee replacement 10/26/2022. She has been doing well. She has been getting physical therapy and is ambulating with a walker. She has been compliant taking  a baby aspirin twice daily for DVT prophylaxis. Examination of her right knee reveals a well-healed surgical incision with nylon sutures in place. No evidence of infection or cellulitis. Calves are soft nontender. She is neurovascular intact distally. Today, sutures were removed and Steri-Strips applied. I have sent in a referral for outpatient physical therapy. I refilled her Percocet and Robaxin. She will continue with a baby aspirin twice daily for DVT prophylaxis. Dental prophylaxis reinforced. Follow-up in 4 weeks for repeat evaluation and 2 view x-rays of the right knee. Call with concerns or questions.   PRECAUTIONS: None  ONSET DATE: 10/26/22   SUBJECTIVE:  SUBJECTIVE STATEMENT:  Patient reports continued pain, but improved today.    PAIN:  Are you having pain? Yes: NPRS scale: 3-4/10 Pain location: R knee Pain description: ache Aggravating factors: walking and exercise Relieving factors: rest meds   OBJECTIVE: (objective measures completed at initial evaluation unless otherwise dated)   DIAGNOSTIC FINDINGS: see imaging   PATIENT SURVEYS:  Patient-specific activity scoring scheme (Point to one number): '0 1 2 3 4 5 6 7 8 9 '$ 10 (Date and Score) Activity Initial   Activity Eval     standing  6    stairs  6    Arising from a chair 6      Additional Additional Total score = sum of the activity scores/number of activities Minimum detectable change (90%CI) for average score = 2 points Minimum detectable change (90%CI) for single activity score = 3 points PSFS developed by: Jillene Bucks., & Binkley, J. (1995). Assessing disability and change on individual  patients: a report of a patient specific measure. Physiotherapy [place], 47, 258-263. Reproduced with the permission of the  authors   Score: 18/30 60% functional       MUSCLE LENGTH: Hamstrings: NT     POSTURE:  flexed R knee with gait   PALPATION: Unremarkable    LOWER EXTREMITY ROM:   A/PROM Right eval Left eval  Hip flexion      Hip extension      Hip abduction      Hip adduction      Hip internal rotation      Hip external rotation      Knee flexion 109/112d 125  Knee extension -4/-2d 0  Ankle dorsiflexion      Ankle plantarflexion      Ankle inversion      Ankle eversion       (Blank rows = not tested)   LOWER EXTREMITY MMT:   MMT Right eval Left eval  Hip flexion 4- 4  Hip extension 4- 4  Hip abduction 4- 4  Hip adduction      Hip internal rotation      Hip external rotation      Knee flexion 4- 4  Knee extension 3+ 4  Ankle dorsiflexion      Ankle plantarflexion 4- 4  Ankle inversion      Ankle eversion       (Blank rows = not tested)   LOWER EXTREMITY SPECIAL TESTS:  deferred   FUNCTIONAL TESTS:  5 times sit to stand: 26s arms crossed   GAIT: Distance walked: 55f x2 Assistive device utilized: Single point cane Level of assistance: Complete Independence Comments: antalgic gait     TODAY'S TREATMENT:                    [facility] Adult PT Treatment:                                                DATE: 12/02/22 Therapeutic Exercise: Nustep level 2 x 5 mins for ROM Standing hip abduction 2x10 BIL LAQ Rt 2# 3x10 Standing heel raise 2x10 Step ups Rt leading 6" 2x10 STS arms crossed x10 SLR 2x10 Rt Bridge 2x10 SAQ 2x10 Rt Seated hamstring curl GTB 2x10 Rt  DATE: Eval and HEP       PATIENT EDUCATION:  Education details: Discussed eval findings, rehab rationale and POC and patient is in agreement  Person educated: Patient and Child(ren) Education method: Customer service manager Education comprehension: verbalized understanding and needs further  education   HOME EXERCISE PROGRAM: Access Code: [email or web address] URL: [email or web address] Date: 11/30/2022 Prepared by: Sharlynn Oliphant   Exercises - Active Straight Leg Raise with Quad Set  - 3 x daily - 5 x weekly - 1 sets - 10 reps - Supine Knee Extension Strengthening  - 3 x daily - 5 x weekly - 1 sets - 10 reps - Seated Long Arc Quad  - 3 x daily - 5 x weekly - 1 sets - 10 reps - Standing Heel Raise with Chair Support  - 3 x daily - 5 x weekly - 3 sets - 10 reps - Supine Heel Slides  - 3 x daily - 5 x weekly - 3 sets - 10 reps   ASSESSMENT:   CLINICAL IMPRESSION: Patient presents to PT reporting continued Rt knee pain, though lessened today, and reports daily HEP compliance. She states that she has more pain in her Lt knee now, and that she plans to have TKA on the Lt as soon as she's able. Session today focused on LE strengthening and knee ROM. Patient was able to tolerate all prescribed exercises with no adverse effects. Patient continues to benefit from skilled PT services and should be progressed as able to improve functional independence.   OBJECTIVE IMPAIRMENTS: Abnormal gait, decreased activity tolerance, decreased balance, decreased endurance, decreased knowledge of condition, decreased knowledge of use of DME, decreased mobility, difficulty walking, decreased ROM, decreased strength, impaired perceived functional ability, improper body mechanics, postural dysfunction, and pain.    ACTIVITY LIMITATIONS: carrying, lifting, sitting, standing, squatting, and stairs   PERSONAL FACTORS: Age, Fitness, Social background, and 1 comorbidity: OA  are also affecting patient's functional outcome.    REHAB POTENTIAL: Good   CLINICAL DECISION MAKING: Stable/uncomplicated   EVALUATION COMPLEXITY: Low     GOALS: Goals reviewed with patient? No   SHORT TERM GOALS: Target date: 12/21/2022   Patient to demonstrate independence in HEP  Baseline:[id number] Goal status: INITIAL    2.  239f ambulation with SPC and step through pattern Baseline: 762fx2 with SPC and step through pattern Goal status: INITIAL       LONG TERM GOALS: Target date: 01/12/2023     Decrease 5x STS to 20s arms crossed Baseline: 26s arms crossed Goal status: INITIAL   2.  Increase PSFS to 24/30 80% Baseline: 18/30 60% Goal status: INITIAL   3.  Increase RLE strength to 4/5 in all deficit regions Baseline:  MMT Right eval Left eval  Hip flexion 4- 4  Hip extension 4- 4  Hip abduction 4- 4  Hip adduction      Hip internal rotation      Hip external rotation      Knee flexion 4- 4  Knee extension 3+ 4  Ankle dorsiflexion      Ankle plantarflexion 4- 4    Goal status: INITIAL   4.  Increase AROM R knee to 115d flexion and -2d extension Baseline:    R L  Knee flexion 109/112d 125  Knee extension -4/-2d 0    Goal status: INITIAL   5.  Decrease pain levels to 4/10 at worst Baseline: 5/10 at worst Goal status: INITIAL         PLAN:  PT FREQUENCY: 2x/week   PT DURATION: 6 weeks   PLANNED INTERVENTIONS: Therapeutic exercises, Therapeutic activity, Neuromuscular re-education, Balance training, Gait training, Patient/Family education, Self Care, Joint mobilization, Stair training, DME instructions, Dry Needling, Electrical stimulation, Cryotherapy, Moist heat, scar mobilization, Manual therapy, and Re-evaluation   PLAN FOR NEXT SESSION: HEP review  and f/u, aerobic work, R knee A/PROM, RLE strengthening focus on quad, gait and balance training   Margarette Canada, PTA 12/02/2022, 6:25 PM

## 2022-12-07 ENCOUNTER — Ambulatory Visit: Payer: Medicare Other

## 2022-12-07 DIAGNOSIS — M6281 Muscle weakness (generalized): Secondary | ICD-10-CM

## 2022-12-07 DIAGNOSIS — Z96651 Presence of right artificial knee joint: Secondary | ICD-10-CM | POA: Diagnosis not present

## 2022-12-07 DIAGNOSIS — R2689 Other abnormalities of gait and mobility: Secondary | ICD-10-CM

## 2022-12-07 NOTE — Therapy (Signed)
OUTPATIENT PHYSICAL THERAPY TREATMENT NOTE   Patient Name: Katie Burns MRN: [id number] DOB:[birth year], 77 y.o., female Today's Date: 12/07/2022  PCP: Ladell Pier, MD   REFERRING PROVIDER: Aundra Dubin, PA-C   END OF SESSION:   PT End of Session - 12/07/22 1711     Visit Number 3    Number of Visits 12    Date for PT Re-Evaluation 01/26/23    Authorization Type MCR    PT Start Time 1710   patient late for session   PT Stop Time 1740    PT Time Calculation (min) 30 min    Activity Tolerance Patient tolerated treatment well    Behavior During Therapy WFL for tasks assessed/performed             Past Medical History:  Diagnosis Date   Hyperlipidemia    Hypertension    Past Surgical History:  Procedure Laterality Date   CATARACT EXTRACTION, BILATERAL Bilateral 2017   with lens implant   COLONOSCOPY     EYE SURGERY Bilateral 2015   cataract extractionwith lens implant   TOTAL KNEE ARTHROPLASTY Right 10/26/2022   Procedure: RIGHT TOTAL KNEE ARTHROPLASTY;  Surgeon: Leandrew Koyanagi, MD;  Location: [facility];  Service: Orthopedics;  Laterality: Right;   Patient Active Problem List   Diagnosis Date Noted   Primary osteoarthritis of right knee 10/26/2022   Status post total right knee replacement 10/26/2022   Prediabetes 11/19/2019   Essential hypertension 10/18/2017   Hyperlipidemia 10/18/2017   Primary osteoarthritis of both knees 10/18/2017   Obesity (BMI 30.0-34.9) 10/18/2017    REFERRING DIAG: Unilateral primary osteoarthritis, right knee   THERAPY DIAG:  Total knee replacement status, right  Other abnormalities of gait and mobility  Muscle weakness (generalized)  Rationale for Evaluation and Treatment Rehabilitation  PERTINENT HISTORY: Patient is a pleasant 77 year old female who comes in today 2 weeks status post right total knee replacement 10/26/2022. She has been doing well. She has been getting physical therapy and is ambulating with a walker. She  has been compliant taking a baby aspirin twice daily for DVT prophylaxis. Examination of her right knee reveals a well-healed surgical incision with nylon sutures in place. No evidence of infection or cellulitis. Calves are soft nontender. She is neurovascular intact distally. Today, sutures were removed and Steri-Strips applied. I have sent in a referral for outpatient physical therapy. I refilled her Percocet and Robaxin. She will continue with a baby aspirin twice daily for DVT prophylaxis. Dental prophylaxis reinforced. Follow-up in 4 weeks for repeat evaluation and 2 view x-rays of the right knee. Call with concerns or questions.   PRECAUTIONS: None  ONSET DATE: 10/26/22   SUBJECTIVE:  SUBJECTIVE STATEMENT:  Reports 2-3/10 level of discomfort   PAIN:  Are you having pain? Yes: NPRS scale: 3-4/10 Pain location: R knee Pain description: ache Aggravating factors: walking and exercise Relieving factors: rest meds   OBJECTIVE: (objective measures completed at initial evaluation unless otherwise dated)   DIAGNOSTIC FINDINGS: see imaging   PATIENT SURVEYS:  Patient-specific activity scoring scheme (Point to one number): '0 1 2 3 4 5 6 7 8 9 '$ 10 (Date and Score) Activity Initial   Activity Eval     standing  6    stairs  6    Arising from a chair 6      Additional Additional Total score = sum of the activity scores/number of activities Minimum detectable change (90%CI) for average score = 2 points Minimum detectable change (90%CI) for single activity score = 3 points PSFS developed by: Jillene Bucks., & Binkley, J. (1995). Assessing disability and change on individual  patients: a report of a patient specific measure. Physiotherapy [place], 47, 258-263. Reproduced with the  permission of the authors   Score: 18/30 60% functional       MUSCLE LENGTH: Hamstrings: NT     POSTURE:  flexed R knee with gait   PALPATION: Unremarkable    LOWER EXTREMITY ROM:   A/PROM Right eval Left eval  Hip flexion      Hip extension      Hip abduction      Hip adduction      Hip internal rotation      Hip external rotation      Knee flexion 109/112d 125  Knee extension -4/-2d 0  Ankle dorsiflexion      Ankle plantarflexion      Ankle inversion      Ankle eversion       (Blank rows = not tested)   LOWER EXTREMITY MMT:   MMT Right eval Left eval  Hip flexion 4- 4  Hip extension 4- 4  Hip abduction 4- 4  Hip adduction      Hip internal rotation      Hip external rotation      Knee flexion 4- 4  Knee extension 3+ 4  Ankle dorsiflexion      Ankle plantarflexion 4- 4  Ankle inversion      Ankle eversion       (Blank rows = not tested)   LOWER EXTREMITY SPECIAL TESTS:  deferred   FUNCTIONAL TESTS:  5 times sit to stand: 26s arms crossed   GAIT: Distance walked: 48f x2 Assistive device utilized: Single point cane Level of assistance: Complete Independence Comments: antalgic gait     TODAY'S TREATMENT:     [facility] Adult PT Treatment:                                                DATE: 12/07/22 Therapeutic Exercise: Nustep level 2 x 6 mins for ROM seat 4 Tandem stance with OH reach over 4 in step 5/5 FAQs 2# R 10x w/adduction SLR 2# 15x SAQs 2# 15x Heel slides over slide board 15x (120d flexion) Standing hamstring curl 2# 10x  PF against wall 15x Marching 10/10 2# R against wall              [facility] Adult PT Treatment:  DATE: 12/02/22 Therapeutic Exercise: Nustep level 2 x 5 mins for ROM Standing hip abduction 2x10 BIL LAQ Rt 2# 3x10 Standing heel raise 2x10 Step ups Rt leading 6" 2x10 STS arms crossed x10 SLR 2x10 Rt Bridge 2x10 SAQ 2x10 Rt Seated hamstring curl GTB 2x10 Rt                                                                                                              DATE: Eval and HEP       PATIENT EDUCATION:  Education details: Discussed eval findings, rehab rationale and POC and patient is in agreement  Person educated: Patient and Child(ren) Education method: Explanation and Demonstration Education comprehension: verbalized understanding and needs further education   HOME EXERCISE PROGRAM: Access Code: [email or web address] URL: [email or web address] Date: 11/30/2022 Prepared by: Sharlynn Oliphant   Exercises - Active Straight Leg Raise with Quad Set  - 3 x daily - 5 x weekly - 1 sets - 10 reps - Supine Knee Extension Strengthening  - 3 x daily - 5 x weekly - 1 sets - 10 reps - Seated Long Arc Quad  - 3 x daily - 5 x weekly - 1 sets - 10 reps - Standing Heel Raise with Chair Support  - 3 x daily - 5 x weekly - 3 sets - 10 reps - Supine Heel Slides  - 3 x daily - 5 x weekly - 3 sets - 10 reps   ASSESSMENT:   CLINICAL IMPRESSION: R knee pain and discomfort minimal.  Feels L knee more painful than R.  Todays session focused on regaining quad function, knee flexion ROM and proprioceptive.  Able to gain 120d flexion following heel.  Able to ambulate in clinic with minimal limp or pain as she has more L knee pain now.  OBJECTIVE IMPAIRMENTS: Abnormal gait, decreased activity tolerance, decreased balance, decreased endurance, decreased knowledge of condition, decreased knowledge of use of DME, decreased mobility, difficulty walking, decreased ROM, decreased strength, impaired perceived functional ability, improper body mechanics, postural dysfunction, and pain.    ACTIVITY LIMITATIONS: carrying, lifting, sitting, standing, squatting, and stairs   PERSONAL FACTORS: Age, Fitness, Social background, and 1 comorbidity: OA  are also affecting patient's functional outcome.    REHAB POTENTIAL: Good   CLINICAL DECISION MAKING: Stable/uncomplicated   EVALUATION  COMPLEXITY: Low     GOALS: Goals reviewed with patient? No   SHORT TERM GOALS: Target date: 12/21/2022   Patient to demonstrate independence in HEP  Baseline:WQNZFWZQ Goal status: INITIAL   2.  21f ambulation with SPC and step through pattern Baseline: 738fx2 with SPC and step through pattern Goal status: INITIAL       LONG TERM GOALS: Target date: 01/12/2023     Decrease 5x STS to 20s arms crossed Baseline: 26s arms crossed Goal status: INITIAL   2.  Increase PSFS to 24/30 80% Baseline: 18/30 60% Goal status: INITIAL   3.  Increase RLE strength to 4/5 in all deficit regions Baseline:  MMT Right eval Left eval  Hip  flexion 4- 4  Hip extension 4- 4  Hip abduction 4- 4  Hip adduction      Hip internal rotation      Hip external rotation      Knee flexion 4- 4  Knee extension 3+ 4  Ankle dorsiflexion      Ankle plantarflexion 4- 4    Goal status: INITIAL   4.  Increase AROM R knee to 115d flexion and -2d extension Baseline:    R L  Knee flexion 109/112d 125  Knee extension -4/-2d 0    Goal status: INITIAL   5.  Decrease pain levels to 4/10 at worst Baseline: 5/10 at worst Goal status: INITIAL         PLAN:   PT FREQUENCY: 2x/week   PT DURATION: 6 weeks   PLANNED INTERVENTIONS: Therapeutic exercises, Therapeutic activity, Neuromuscular re-education, Balance training, Gait training, Patient/Family education, Self Care, Joint mobilization, Stair training, DME instructions, Dry Needling, Electrical stimulation, Cryotherapy, Moist heat, scar mobilization, Manual therapy, and Re-evaluation   PLAN FOR NEXT SESSION: HEP review  and f/u, aerobic work, R knee A/PROM, RLE strengthening focus on quad, gait and balance training   Lanice Shirts, PT 12/07/2022, 5:12 PM

## 2022-12-08 ENCOUNTER — Ambulatory Visit (INDEPENDENT_AMBULATORY_CARE_PROVIDER_SITE_OTHER): Payer: Medicare Other

## 2022-12-08 ENCOUNTER — Encounter: Payer: Self-pay | Admitting: Orthopaedic Surgery

## 2022-12-08 ENCOUNTER — Ambulatory Visit (INDEPENDENT_AMBULATORY_CARE_PROVIDER_SITE_OTHER): Payer: Medicare Other | Admitting: Orthopaedic Surgery

## 2022-12-08 DIAGNOSIS — Z96651 Presence of right artificial knee joint: Secondary | ICD-10-CM

## 2022-12-08 DIAGNOSIS — M1712 Unilateral primary osteoarthritis, left knee: Secondary | ICD-10-CM | POA: Diagnosis not present

## 2022-12-08 MED ORDER — BUPIVACAINE HCL 0.5 % IJ SOLN
2.0000 mL | INTRAMUSCULAR | Status: AC | PRN
  Administered 2022-12-08: 2 mL via INTRA_ARTICULAR

## 2022-12-08 MED ORDER — LIDOCAINE HCL 1 % IJ SOLN
2.0000 mL | INTRAMUSCULAR | Status: AC | PRN
  Administered 2022-12-08: 2 mL

## 2022-12-08 MED ORDER — METHYLPREDNISOLONE ACETATE 40 MG/ML IJ SUSP
40.0000 mg | INTRAMUSCULAR | Status: AC | PRN
  Administered 2022-12-08: 40 mg via INTRA_ARTICULAR

## 2022-12-08 NOTE — Progress Notes (Signed)
Office Visit Note   Patient: Katie Burns           Date of Birth: 1946-06-23           MRN: [id number] Visit Date: 12/08/2022              Requested by: Ladell Pier, MD [place],  [place] [address] PCP: Ladell Pier, MD   Assessment & Plan: Visit Diagnoses:  1. S/P total knee arthroplasty, right   2. Status post total right knee replacement   3. Primary osteoarthritis of left knee     Plan: I am very happy with her recovery from the knee replacement.  Instructions reviewed.  Questions answered to her satisfaction.  Dental prophylaxis reinforced.  We did provide her with a left knee steroid injection today.  Recheck in 6 weeks for 84-monthfollow-up visit.  Follow-Up Instructions: No follow-ups on file.   Orders:  Orders Placed This Encounter  Procedures   Large Joint Inj: L knee   XR Knee 1-2 Views Right   No orders of the defined types were placed in this encounter.     Procedures: Large Joint Inj: L knee on 12/08/2022 4:37 PM Details: 22 G needle Medications: 2 mL bupivacaine 0.5 %; 2 mL lidocaine 1 %; 40 mg methylPREDNISolone acetate 40 MG/ML Outcome: tolerated well, no immediate complications Patient was prepped and draped in the usual sterile fashion.       Clinical Data: No additional findings.   Subjective: Chief Complaint  Patient presents with   Right Knee - Follow-up    Right total knee arthroplasty 10/26/2022    HPI  Patient is 6 weeks status post right total knee replacement.  Overall doing well.  She achieved 120 degrees of flexion yesterday at physical therapy.  Her left knee has started to bother her more and requesting a steroid injection today.  Review of Systems   Objective: Vital Signs: There were no vitals taken for this visit.  Physical Exam  Ortho Exam  Examination right knee shows fully healed surgical scar.  Range of motion is progressing very nicely.  Good varus valgus  stability.  Examination left knee is unchanged.  Specialty Comments:  No specialty comments available.  Imaging: XR Knee 1-2 Views Right  Result Date: 12/08/2022 Stable right total knee replacement in good alignment     PMFS History: Patient Active Problem List   Diagnosis Date Noted   Primary osteoarthritis of right knee 10/26/2022   Status post total right knee replacement 10/26/2022   Prediabetes 11/19/2019   Essential hypertension 10/18/2017   Hyperlipidemia 10/18/2017   Primary osteoarthritis of both knees 10/18/2017   Obesity (BMI 30.0-34.9) 10/18/2017   Past Medical History:  Diagnosis Date   Hyperlipidemia    Hypertension     Family History  Problem Relation Age of Onset   Hypertension Daughter    Breast cancer Neg Hx     Past Surgical History:  Procedure Laterality Date   CATARACT EXTRACTION, BILATERAL Bilateral 2017   with lens implant   COLONOSCOPY     EYE SURGERY Bilateral 2015   cataract extractionwith lens implant   TOTAL KNEE ARTHROPLASTY Right 10/26/2022   Procedure: RIGHT TOTAL KNEE ARTHROPLASTY;  Surgeon: XLeandrew Koyanagi MD;  Location: [facility]  Service: Orthopedics;  Laterality: Right;   Social History   Occupational History   Occupation: unempolyed  Tobacco Use   Smoking status: Never   Smokeless tobacco: Never  Vaping Use   Vaping Use: Never used  Substance and Sexual Activity   Alcohol use: No   Drug use: No   Sexual activity: Not on file

## 2022-12-09 ENCOUNTER — Ambulatory Visit: Payer: Medicare Other

## 2022-12-09 DIAGNOSIS — R2689 Other abnormalities of gait and mobility: Secondary | ICD-10-CM

## 2022-12-09 DIAGNOSIS — Z96651 Presence of right artificial knee joint: Secondary | ICD-10-CM

## 2022-12-09 DIAGNOSIS — M6281 Muscle weakness (generalized): Secondary | ICD-10-CM

## 2022-12-09 NOTE — Therapy (Signed)
OUTPATIENT PHYSICAL THERAPY TREATMENT NOTE   Patient Name: Katie Burns MRN: [id number] DOB:[birth year], 77 y.o., female Today's Date: 12/09/2022  PCP: Ladell Pier, MD   REFERRING PROVIDER: Aundra Dubin, PA-C   END OF SESSION:   PT End of Session - 12/09/22 1753     Visit Number 4    Number of Visits 12    Date for PT Re-Evaluation 01/26/23    Authorization Type MCR    PT Start Time 1750    PT Stop Time 1830    PT Time Calculation (min) 40 min    Activity Tolerance Patient tolerated treatment well    Behavior During Therapy WFL for tasks assessed/performed             Past Medical History:  Diagnosis Date   Hyperlipidemia    Hypertension    Past Surgical History:  Procedure Laterality Date   CATARACT EXTRACTION, BILATERAL Bilateral 2017   with lens implant   COLONOSCOPY     EYE SURGERY Bilateral 2015   cataract extractionwith lens implant   TOTAL KNEE ARTHROPLASTY Right 10/26/2022   Procedure: RIGHT TOTAL KNEE ARTHROPLASTY;  Surgeon: Leandrew Koyanagi, MD;  Location: [facility];  Service: Orthopedics;  Laterality: Right;   Patient Active Problem List   Diagnosis Date Noted   Primary osteoarthritis of right knee 10/26/2022   Status post total right knee replacement 10/26/2022   Prediabetes 11/19/2019   Essential hypertension 10/18/2017   Hyperlipidemia 10/18/2017   Primary osteoarthritis of both knees 10/18/2017   Obesity (BMI 30.0-34.9) 10/18/2017    REFERRING DIAG: Unilateral primary osteoarthritis, right knee   THERAPY DIAG:  Total knee replacement status, right  Other abnormalities of gait and mobility  Muscle weakness (generalized)  Rationale for Evaluation and Treatment Rehabilitation  PERTINENT HISTORY: Patient is a pleasant 77 year old female who comes in today 2 weeks status post right total knee replacement 10/26/2022. She has been doing well. She has been getting physical therapy and is ambulating with a walker. She has been compliant taking  a baby aspirin twice daily for DVT prophylaxis. Examination of her right knee reveals a well-healed surgical incision with nylon sutures in place. No evidence of infection or cellulitis. Calves are soft nontender. She is neurovascular intact distally. Today, sutures were removed and Steri-Strips applied. I have sent in a referral for outpatient physical therapy. I refilled her Percocet and Robaxin. She will continue with a baby aspirin twice daily for DVT prophylaxis. Dental prophylaxis reinforced. Follow-up in 4 weeks for repeat evaluation and 2 view x-rays of the right knee. Call with concerns or questions.   PRECAUTIONS: None  ONSET DATE: 10/26/22   SUBJECTIVE:  SUBJECTIVE STATEMENT:  Minimal pain and discomfort, requests to increase flexion today   PAIN:  Are you having pain? Yes: NPRS scale: 3-4/10 Pain location: R knee Pain description: ache Aggravating factors: walking and exercise Relieving factors: rest meds   OBJECTIVE: (objective measures completed at initial evaluation unless otherwise dated)   DIAGNOSTIC FINDINGS: see imaging   PATIENT SURVEYS:  Patient-specific activity scoring scheme (Point to one number): '0 1 2 3 4 5 6 7 8 9 '$ 10 (Date and Score) Activity Initial   Activity Eval     standing  6    stairs  6    Arising from a chair 6      Additional Additional Total score = sum of the activity scores/number of activities Minimum detectable change (90%CI) for average score = 2 points Minimum detectable change (90%CI) for single activity score = 3 points PSFS developed by: Jillene Bucks., & Binkley, J. (1995). Assessing disability and change on individual  patients: a report of a patient specific measure. Physiotherapy [place], 47, 258-263. Reproduced with the  permission of the authors   Score: 18/30 60% functional       MUSCLE LENGTH: Hamstrings: NT     POSTURE:  flexed R knee with gait   PALPATION: Unremarkable    LOWER EXTREMITY ROM:   A/PROM Right eval Left eval R 12/08/12  Hip flexion       Hip extension       Hip abduction       Hip adduction       Hip internal rotation       Hip external rotation       Knee flexion 109/112d 125 120/125  Knee extension -4/-2d 0   Ankle dorsiflexion       Ankle plantarflexion       Ankle inversion       Ankle eversion        (Blank rows = not tested)   LOWER EXTREMITY MMT:   MMT Right eval Left eval  Hip flexion 4- 4  Hip extension 4- 4  Hip abduction 4- 4  Hip adduction      Hip internal rotation      Hip external rotation      Knee flexion 4- 4  Knee extension 3+ 4  Ankle dorsiflexion      Ankle plantarflexion 4- 4  Ankle inversion      Ankle eversion       (Blank rows = not tested)   LOWER EXTREMITY SPECIAL TESTS:  deferred   FUNCTIONAL TESTS:  5 times sit to stand: 26s arms crossed   GAIT: Distance walked: 66f x2 Assistive device utilized: Single point cane Level of assistance: Complete Independence Comments: antalgic gait     TODAY'S TREATMENT:     [facility] Adult PT Treatment:                                                DATE: 12/09/22 Therapeutic Exercise: Nustep level 2 x 8 mins for ROM seat 4 Supine hip fallouts RTB 10/10 Step ups R 4 in 10x Side stepping at counter 3 trips (EO, EC no UE support) Tandem and retrowalking at counter(EO, EC no UE support) Gait training with cane in alternating hands showing limp coming from LLE Supine heel slides over slide board with strap 10x f/b  5x with PT OP yielding 125d flexion   [facility] Adult PT Treatment:                                                DATE: 12/07/22 Therapeutic Exercise: Nustep level 2 x 6 mins for ROM seat 4 Tandem stance with OH reach over 4 in step 5/5 FAQs 2# R 10x w/adduction SLR 2#  15x SAQs 2# 15x Heel slides over slide board 15x (120d flexion) Standing hamstring curl 2# 10x  PF against wall 15x Marching 10/10 2# R against wall              [facility] Adult PT Treatment:                                                DATE: 12/02/22 Therapeutic Exercise: Nustep level 2 x 5 mins for ROM Standing hip abduction 2x10 BIL LAQ Rt 2# 3x10 Standing heel raise 2x10 Step ups Rt leading 6" 2x10 STS arms crossed x10 SLR 2x10 Rt Bridge 2x10 SAQ 2x10 Rt Seated hamstring curl GTB 2x10 Rt                                                                                                             DATE: Eval and HEP       PATIENT EDUCATION:  Education details: Discussed eval findings, rehab rationale and POC and patient is in agreement  Person educated: Patient and Child(ren) Education method: Explanation and Demonstration Education comprehension: verbalized understanding and needs further education   HOME EXERCISE PROGRAM: Access Code: [email or web address] URL: [email or web address] Date: 11/30/2022 Prepared by: Sharlynn Oliphant   Exercises - Active Straight Leg Raise with Quad Set  - 3 x daily - 5 x weekly - 1 sets - 10 reps - Supine Knee Extension Strengthening  - 3 x daily - 5 x weekly - 1 sets - 10 reps - Seated Long Arc Quad  - 3 x daily - 5 x weekly - 1 sets - 10 reps - Standing Heel Raise with Chair Support  - 3 x daily - 5 x weekly - 3 sets - 10 reps - Supine Heel Slides  - 3 x daily - 5 x weekly - 3 sets - 10 reps   ASSESSMENT:   CLINICAL IMPRESSION: Continues to report diminishing pain levels and ascends stairs w/o pain.  Increased PROM in R knee to 125d flexion.  Attempted runners step but task too complex for patient.  Any gait deviation appears to arise from LLE due to underlying OA  OBJECTIVE IMPAIRMENTS: Abnormal gait, decreased activity tolerance, decreased balance, decreased endurance, decreased knowledge of condition, decreased knowledge of use of DME,  decreased mobility, difficulty walking, decreased ROM, decreased strength, impaired perceived functional ability, improper body mechanics, postural dysfunction,  and pain.    ACTIVITY LIMITATIONS: carrying, lifting, sitting, standing, squatting, and stairs   PERSONAL FACTORS: Age, Fitness, Social background, and 1 comorbidity: OA  are also affecting patient's functional outcome.    REHAB POTENTIAL: Good   CLINICAL DECISION MAKING: Stable/uncomplicated   EVALUATION COMPLEXITY: Low     GOALS: Goals reviewed with patient? No   SHORT TERM GOALS: Target date: 12/21/2022   Patient to demonstrate independence in HEP  Baseline:[id number] Goal status: INITIAL   2.  241f ambulation with SPC and step through pattern Baseline: 760fx2 with SPC and step through pattern Goal status: INITIAL       LONG TERM GOALS: Target date: 01/12/2023     Decrease 5x STS to 20s arms crossed Baseline: 26s arms crossed Goal status: INITIAL   2.  Increase PSFS to 24/30 80% Baseline: 18/30 60% Goal status: INITIAL   3.  Increase RLE strength to 4/5 in all deficit regions Baseline:  MMT Right eval Left eval  Hip flexion 4- 4  Hip extension 4- 4  Hip abduction 4- 4  Hip adduction      Hip internal rotation      Hip external rotation      Knee flexion 4- 4  Knee extension 3+ 4  Ankle dorsiflexion      Ankle plantarflexion 4- 4    Goal status: INITIAL   4.  Increase AROM R knee to 115d flexion and -2d extension Baseline:    R L  Knee flexion 109/112d 125  Knee extension -4/-2d 0    Goal status: INITIAL   5.  Decrease pain levels to 4/10 at worst Baseline: 5/10 at worst Goal status: INITIAL         PLAN:   PT FREQUENCY: 2x/week   PT DURATION: 6 weeks   PLANNED INTERVENTIONS: Therapeutic exercises, Therapeutic activity, Neuromuscular re-education, Balance training, Gait training, Patient/Family education, Self Care, Joint mobilization, Stair training, DME instructions, Dry  Needling, Electrical stimulation, Cryotherapy, Moist heat, scar mobilization, Manual therapy, and Re-evaluation   PLAN FOR NEXT SESSION: HEP review  and f/u, aerobic work, R knee A/PROM, RLE strengthening focus on quad, gait and balance training   JeLanice ShirtsPT 12/09/2022, 7:23 PM

## 2022-12-14 ENCOUNTER — Ambulatory Visit: Payer: Medicare Other

## 2022-12-17 ENCOUNTER — Ambulatory Visit: Payer: Medicare Other

## 2022-12-17 DIAGNOSIS — Z96651 Presence of right artificial knee joint: Secondary | ICD-10-CM

## 2022-12-17 DIAGNOSIS — M6281 Muscle weakness (generalized): Secondary | ICD-10-CM

## 2022-12-17 DIAGNOSIS — R2689 Other abnormalities of gait and mobility: Secondary | ICD-10-CM

## 2022-12-17 NOTE — Therapy (Signed)
OUTPATIENT PHYSICAL THERAPY TREATMENT NOTE   Patient Name: Katie Burns MRN: [id number] DOB:1945/10/05, 77 y.o., female Today's Date: 12/17/2022  PCP: Ladell Pier, MD   REFERRING PROVIDER: Aundra Dubin, PA-C   END OF SESSION:   PT End of Session - 12/17/22 1708     Visit Number 5    Number of Visits 12    Date for PT Re-Evaluation 01/26/23    Authorization Type MCR    PT Start Time 1707    PT Stop Time 1745    PT Time Calculation (min) 38 min    Activity Tolerance Patient tolerated treatment well    Behavior During Therapy WFL for tasks assessed/performed              Past Medical History:  Diagnosis Date   Hyperlipidemia    Hypertension    Past Surgical History:  Procedure Laterality Date   CATARACT EXTRACTION, BILATERAL Bilateral 2017   with lens implant   COLONOSCOPY     EYE SURGERY Bilateral 2015   cataract extractionwith lens implant   TOTAL KNEE ARTHROPLASTY Right 10/26/2022   Procedure: RIGHT TOTAL KNEE ARTHROPLASTY;  Surgeon: Leandrew Koyanagi, MD;  Location: [facility];  Service: Orthopedics;  Laterality: Right;   Patient Active Problem List   Diagnosis Date Noted   Primary osteoarthritis of right knee 10/26/2022   Status post total right knee replacement 10/26/2022   Prediabetes 11/19/2019   Essential hypertension 10/18/2017   Hyperlipidemia 10/18/2017   Primary osteoarthritis of both knees 10/18/2017   Obesity (BMI 30.0-34.9) 10/18/2017    REFERRING DIAG: Unilateral primary osteoarthritis, right knee   THERAPY DIAG:  Total knee replacement status, right  Other abnormalities of gait and mobility  Muscle weakness (generalized)  Rationale for Evaluation and Treatment Rehabilitation  PERTINENT HISTORY: Patient is a pleasant 77 year old female who comes in today 2 weeks status post right total knee replacement 10/26/2022. She has been doing well. She has been getting physical therapy and is ambulating with a walker. She has been compliant  taking a baby aspirin twice daily for DVT prophylaxis. Examination of her right knee reveals a well-healed surgical incision with nylon sutures in place. No evidence of infection or cellulitis. Calves are soft nontender. She is neurovascular intact distally. Today, sutures were removed and Steri-Strips applied. I have sent in a referral for outpatient physical therapy. I refilled her Percocet and Robaxin. She will continue with a baby aspirin twice daily for DVT prophylaxis. Dental prophylaxis reinforced. Follow-up in 4 weeks for repeat evaluation and 2 view x-rays of the right knee. Call with concerns or questions.   PRECAUTIONS: None  ONSET DATE: 10/26/22   SUBJECTIVE:  SUBJECTIVE STATEMENT:  Reports minimal pain, states her knee only begins to bother her some when she has been standing for ~30+ minutes.    PAIN:  Are you having pain? Yes: NPRS scale: 2-3/10 Pain location: R knee Pain description: ache Aggravating factors: walking and exercise Relieving factors: rest meds   OBJECTIVE: (objective measures completed at initial evaluation unless otherwise dated)   DIAGNOSTIC FINDINGS: see imaging   PATIENT SURVEYS:  Patient-specific activity scoring scheme (Point to one number): 0 1 2 3 4 5 6 7 8 9  10 (Date and Score) Activity Initial   Activity Eval     standing  6    stairs  6    Arising from a chair 6      Additional Additional Total score = sum of the activity scores/number of activities Minimum detectable change (90%CI) for average score = 2 points Minimum detectable change (90%CI) for single activity score = 3 points PSFS developed by: Jillene Bucks., & Binkley, J. (1995). Assessing disability and change on individual  patients: a report of a patient specific measure.  Physiotherapy [place], 47, [phone]. Reproduced with the permission of the authors   Score: 18/30 60% functional       MUSCLE LENGTH: Hamstrings: NT     POSTURE:  flexed R knee with gait   PALPATION: Unremarkable    LOWER EXTREMITY ROM:   A/PROM Right eval Left eval R 12/08/12  Hip flexion       Hip extension       Hip abduction       Hip adduction       Hip internal rotation       Hip external rotation       Knee flexion 109/112d 125 120/125  Knee extension -4/-2d 0   Ankle dorsiflexion       Ankle plantarflexion       Ankle inversion       Ankle eversion        (Blank rows = not tested)   LOWER EXTREMITY MMT:   MMT Right eval Left eval  Hip flexion 4- 4  Hip extension 4- 4  Hip abduction 4- 4  Hip adduction      Hip internal rotation      Hip external rotation      Knee flexion 4- 4  Knee extension 3+ 4  Ankle dorsiflexion      Ankle plantarflexion 4- 4  Ankle inversion      Ankle eversion       (Blank rows = not tested)   LOWER EXTREMITY SPECIAL TESTS:  deferred   FUNCTIONAL TESTS:  5 times sit to stand: 26s arms crossed   GAIT: Distance walked: 42ft x2 Assistive device utilized: Single point cane Level of assistance: Complete Independence Comments: antalgic gait     TODAY'S TREATMENT:     [facility] Adult PT Treatment:                                                DATE: 12/17/22 Therapeutic Exercise: Nustep level 2 x 8 mins for ROM seat 4 Standing hip abduction/extension 2x10 BIL Standing hamstring curls 2x10 BIL Standing heel raises 2x10 Step ups R 4 in 10x  each fwd/lat Step ups R 6 in 10x fwd NMRE: Slow marching on airex x30" 1 UE support FT stance  on Airex EO/EC x30"  Tandem stance on airex x30" BIL - occasional touchdown support to maintain balance   [facility] Adult PT Treatment:                                                DATE: 12/09/22 Therapeutic Exercise: Nustep level 2 x 8 mins for ROM seat 4 Supine hip fallouts RTB 10/10 Step  ups R 4 in 10x Side stepping at counter 3 trips (EO, EC no UE support) Tandem and retrowalking at counter(EO, EC no UE support) Gait training with cane in alternating hands showing limp coming from LLE Supine heel slides over slide board with strap 10x f/b 5x with PT OP yielding 125d flexion   [facility] Adult PT Treatment:                                                DATE: 12/07/22 Therapeutic Exercise: Nustep level 2 x 6 mins for ROM seat 4 Tandem stance with OH reach over 4 in step 5/5 FAQs 2# R 10x w/adduction SLR 2# 15x SAQs 2# 15x Heel slides over slide board 15x (120d flexion) Standing hamstring curl 2# 10x  PF against wall 15x Marching 10/10 2# R against wall                  PATIENT EDUCATION:  Education details: Discussed eval findings, rehab rationale and POC and patient is in agreement  Person educated: Patient and Child(ren) Education method: Explanation and Demonstration Education comprehension: verbalized understanding and needs further education   HOME EXERCISE PROGRAM: Access Code: [email or web address] URL: [email or web address] Date: 11/30/2022 Prepared by: Sharlynn Oliphant   Exercises - Active Straight Leg Raise with Quad Set  - 3 x daily - 5 x weekly - 1 sets - 10 reps - Supine Knee Extension Strengthening  - 3 x daily - 5 x weekly - 1 sets - 10 reps - Seated Long Arc Quad  - 3 x daily - 5 x weekly - 1 sets - 10 reps - Standing Heel Raise with Chair Support  - 3 x daily - 5 x weekly - 3 sets - 10 reps - Supine Heel Slides  - 3 x daily - 5 x weekly - 3 sets - 10 reps   ASSESSMENT:   CLINICAL IMPRESSION:  Patient presents to PT reporting minimal pain, daughter states she takes Tylenol in the morning and this helps her pain all day. Session today continued to focus on LE strengthening with particular focus on increasing standing activity tolerance as patient states her knee will begin to bother her after ~30 minutes of standing. She was able to complete 20  minutes of continuous standing exercise before needing a rest break, citing no pain, just general fatigue. She had the most difficulty with tandem stance on Airex, needing occasional UE support and states that she is afraid of falling. Patient continues to benefit from skilled PT services and should be progressed as able to improve functional independence.    OBJECTIVE IMPAIRMENTS: Abnormal gait, decreased activity tolerance, decreased balance, decreased endurance, decreased knowledge of condition, decreased knowledge of use of DME, decreased mobility, difficulty walking, decreased ROM, decreased strength, impaired perceived functional ability, improper body mechanics, postural dysfunction, and  pain.    ACTIVITY LIMITATIONS: carrying, lifting, sitting, standing, squatting, and stairs   PERSONAL FACTORS: Age, Fitness, Social background, and 1 comorbidity: OA  are also affecting patient's functional outcome.    REHAB POTENTIAL: Good   CLINICAL DECISION MAKING: Stable/uncomplicated   EVALUATION COMPLEXITY: Low     GOALS: Goals reviewed with patient? No   SHORT TERM GOALS: Target date: 12/21/2022   Patient to demonstrate independence in HEP  Baseline:[id number] Goal status: INITIAL   2.  264ft ambulation with SPC and step through pattern Baseline: 25ft x2 with SPC and step through pattern Goal status: INITIAL       LONG TERM GOALS: Target date: 01/12/2023     Decrease 5x STS to 20s arms crossed Baseline: 26s arms crossed Goal status: INITIAL   2.  Increase PSFS to 24/30 80% Baseline: 18/30 60% Goal status: INITIAL   3.  Increase RLE strength to 4/5 in all deficit regions Baseline:  MMT Right eval Left eval  Hip flexion 4- 4  Hip extension 4- 4  Hip abduction 4- 4  Hip adduction      Hip internal rotation      Hip external rotation      Knee flexion 4- 4  Knee extension 3+ 4  Ankle dorsiflexion      Ankle plantarflexion 4- 4    Goal status: INITIAL   4.  Increase  AROM R knee to 115d flexion and -2d extension Baseline:    R L  Knee flexion 109/112d 125  Knee extension -4/-2d 0    Goal status: INITIAL   5.  Decrease pain levels to 4/10 at worst Baseline: 5/10 at worst Goal status: INITIAL         PLAN:   PT FREQUENCY: 2x/week   PT DURATION: 6 weeks   PLANNED INTERVENTIONS: Therapeutic exercises, Therapeutic activity, Neuromuscular re-education, Balance training, Gait training, Patient/Family education, Self Care, Joint mobilization, Stair training, DME instructions, Dry Needling, Electrical stimulation, Cryotherapy, Moist heat, scar mobilization, Manual therapy, and Re-evaluation   PLAN FOR NEXT SESSION: HEP review  and f/u, aerobic work, R knee A/PROM, RLE strengthening focus on quad, gait and balance training   Margarette Canada, PTA 12/17/2022, 5:45 PM

## 2022-12-21 ENCOUNTER — Ambulatory Visit: Payer: Medicare Other

## 2022-12-21 NOTE — Therapy (Deleted)
OUTPATIENT PHYSICAL THERAPY TREATMENT NOTE   Patient Name: Katie Burns MRN: [id number] DOB:Oct 23, 1945, 77 y.o., female Today's Date: 12/17/2022  PCP: Ladell Pier, MD   REFERRING PROVIDER: Aundra Dubin, PA-C   END OF SESSION:   PT End of Session - 12/17/22 1708     Visit Number 5    Number of Visits 12    Date for PT Re-Evaluation 01/26/23    Authorization Type MCR    PT Start Time 1707    PT Stop Time 1745    PT Time Calculation (min) 38 min    Activity Tolerance Patient tolerated treatment well    Behavior During Therapy WFL for tasks assessed/performed              Past Medical History:  Diagnosis Date   Hyperlipidemia    Hypertension    Past Surgical History:  Procedure Laterality Date   CATARACT EXTRACTION, BILATERAL Bilateral 2017   with lens implant   COLONOSCOPY     EYE SURGERY Bilateral 2015   cataract extractionwith lens implant   TOTAL KNEE ARTHROPLASTY Right 10/26/2022   Procedure: RIGHT TOTAL KNEE ARTHROPLASTY;  Surgeon: Leandrew Koyanagi, MD;  Location: [place];  Service: Orthopedics;  Laterality: Right;   Patient Active Problem List   Diagnosis Date Noted   Primary osteoarthritis of right knee 10/26/2022   Status post total right knee replacement 10/26/2022   Prediabetes 11/19/2019   Essential hypertension 10/18/2017   Hyperlipidemia 10/18/2017   Primary osteoarthritis of both knees 10/18/2017   Obesity (BMI 30.0-34.9) 10/18/2017    REFERRING DIAG: Unilateral primary osteoarthritis, right knee   THERAPY DIAG:  Total knee replacement status, right  Other abnormalities of gait and mobility  Muscle weakness (generalized)  Rationale for Evaluation and Treatment Rehabilitation  PERTINENT HISTORY: Patient is a pleasant 77 year old female who comes in today 2 weeks status post right total knee replacement 10/26/2022. She has been doing well. She has been getting physical therapy and is ambulating with a walker. She has been compliant  taking a baby aspirin twice daily for DVT prophylaxis. Examination of her right knee reveals a well-healed surgical incision with nylon sutures in place. No evidence of infection or cellulitis. Calves are soft nontender. She is neurovascular intact distally. Today, sutures were removed and Steri-Strips applied. I have sent in a referral for outpatient physical therapy. I refilled her Percocet and Robaxin. She will continue with a baby aspirin twice daily for DVT prophylaxis. Dental prophylaxis reinforced. Follow-up in 4 weeks for repeat evaluation and 2 view x-rays of the right knee. Call with concerns or questions.   PRECAUTIONS: None  ONSET DATE: 10/26/22   SUBJECTIVE:  SUBJECTIVE STATEMENT:  Reports minimal pain, states her knee only begins to bother her some when she has been standing for ~30+ minutes.    PAIN:  Are you having pain? Yes: NPRS scale: 2-3/10 Pain location: R knee Pain description: ache Aggravating factors: walking and exercise Relieving factors: rest meds   OBJECTIVE: (objective measures completed at initial evaluation unless otherwise dated)   DIAGNOSTIC FINDINGS: see imaging   PATIENT SURVEYS:  Patient-specific activity scoring scheme (Point to one number): 0 1 2 3 4 5 6 7 8 9  10 (Date and Score) Activity Initial   Activity Eval     standing  6    stairs  6    Arising from a chair 6      Additional Additional Total score = sum of the activity scores/number of activities Minimum detectable change (90%CI) for average score = 2 points Minimum detectable change (90%CI) for single activity score = 3 points PSFS developed by: Jillene Bucks., & Binkley, J. (1995). Assessing disability and change on individual  patients: a report of a patient specific measure.  Physiotherapy [place], 47, [phone]. Reproduced with the permission of the authors   Score: 18/30 60% functional       MUSCLE LENGTH: Hamstrings: NT     POSTURE:  flexed R knee with gait   PALPATION: Unremarkable    LOWER EXTREMITY ROM:   A/PROM Right eval Left eval R 12/08/12  Hip flexion       Hip extension       Hip abduction       Hip adduction       Hip internal rotation       Hip external rotation       Knee flexion 109/112d 125 120/125  Knee extension -4/-2d 0   Ankle dorsiflexion       Ankle plantarflexion       Ankle inversion       Ankle eversion        (Blank rows = not tested)   LOWER EXTREMITY MMT:   MMT Right eval Left eval  Hip flexion 4- 4  Hip extension 4- 4  Hip abduction 4- 4  Hip adduction      Hip internal rotation      Hip external rotation      Knee flexion 4- 4  Knee extension 3+ 4  Ankle dorsiflexion      Ankle plantarflexion 4- 4  Ankle inversion      Ankle eversion       (Blank rows = not tested)   LOWER EXTREMITY SPECIAL TESTS:  deferred   FUNCTIONAL TESTS:  5 times sit to stand: 26s arms crossed   GAIT: Distance walked: 68ft x2 Assistive device utilized: Single point cane Level of assistance: Complete Independence Comments: antalgic gait     TODAY'S TREATMENT:     [facility] Adult PT Treatment:                                                DATE: 12/17/22 Therapeutic Exercise: Nustep level 2 x 8 mins for ROM seat 4 Standing hip abduction/extension 2x10 BIL Standing hamstring curls 2x10 BIL Standing heel raises 2x10 Step ups R 4 in 10x  each fwd/lat Step ups R 6 in 10x fwd NMRE: Slow marching on airex x30" 1 UE support FT stance  on Airex EO/EC x30"  Tandem stance on airex x30" BIL - occasional touchdown support to maintain balance   [facility] Adult PT Treatment:                                                DATE: 12/09/22 Therapeutic Exercise: Nustep level 2 x 8 mins for ROM seat 4 Supine hip fallouts RTB 10/10 Step  ups R 4 in 10x Side stepping at counter 3 trips (EO, EC no UE support) Tandem and retrowalking at counter(EO, EC no UE support) Gait training with cane in alternating hands showing limp coming from LLE Supine heel slides over slide board with strap 10x f/b 5x with PT OP yielding 125d flexion   [facility] Adult PT Treatment:                                                DATE: 12/07/22 Therapeutic Exercise: Nustep level 2 x 6 mins for ROM seat 4 Tandem stance with OH reach over 4 in step 5/5 FAQs 2# R 10x w/adduction SLR 2# 15x SAQs 2# 15x Heel slides over slide board 15x (120d flexion) Standing hamstring curl 2# 10x  PF against wall 15x Marching 10/10 2# R against wall                  PATIENT EDUCATION:  Education details: Discussed eval findings, rehab rationale and POC and patient is in agreement  Person educated: Patient and Child(ren) Education method: Explanation and Demonstration Education comprehension: verbalized understanding and needs further education   HOME EXERCISE PROGRAM: Access Code: [email or web address] URL: [email or web address] Date: 11/30/2022 Prepared by: Sharlynn Oliphant   Exercises - Active Straight Leg Raise with Quad Set  - 3 x daily - 5 x weekly - 1 sets - 10 reps - Supine Knee Extension Strengthening  - 3 x daily - 5 x weekly - 1 sets - 10 reps - Seated Long Arc Quad  - 3 x daily - 5 x weekly - 1 sets - 10 reps - Standing Heel Raise with Chair Support  - 3 x daily - 5 x weekly - 3 sets - 10 reps - Supine Heel Slides  - 3 x daily - 5 x weekly - 3 sets - 10 reps   ASSESSMENT:   CLINICAL IMPRESSION:  Patient presents to PT reporting minimal pain, daughter states she takes Tylenol in the morning and this helps her pain all day. Session today continued to focus on LE strengthening with particular focus on increasing standing activity tolerance as patient states her knee will begin to bother her after ~30 minutes of standing. She was able to complete 20  minutes of continuous standing exercise before needing a rest break, citing no pain, just general fatigue. She had the most difficulty with tandem stance on Airex, needing occasional UE support and states that she is afraid of falling. Patient continues to benefit from skilled PT services and should be progressed as able to improve functional independence.    OBJECTIVE IMPAIRMENTS: Abnormal gait, decreased activity tolerance, decreased balance, decreased endurance, decreased knowledge of condition, decreased knowledge of use of DME, decreased mobility, difficulty walking, decreased ROM, decreased strength, impaired perceived functional ability, improper body mechanics, postural dysfunction, and  pain.    ACTIVITY LIMITATIONS: carrying, lifting, sitting, standing, squatting, and stairs   PERSONAL FACTORS: Age, Fitness, Social background, and 1 comorbidity: OA  are also affecting patient's functional outcome.    REHAB POTENTIAL: Good   CLINICAL DECISION MAKING: Stable/uncomplicated   EVALUATION COMPLEXITY: Low     GOALS: Goals reviewed with patient? No   SHORT TERM GOALS: Target date: 12/21/2022   Patient to demonstrate independence in HEP  Baseline:[id number] Goal status: INITIAL   2.  243ft ambulation with SPC and step through pattern Baseline: 56ft x2 with SPC and step through pattern Goal status: INITIAL       LONG TERM GOALS: Target date: 01/12/2023     Decrease 5x STS to 20s arms crossed Baseline: 26s arms crossed Goal status: INITIAL   2.  Increase PSFS to 24/30 80% Baseline: 18/30 60% Goal status: INITIAL   3.  Increase RLE strength to 4/5 in all deficit regions Baseline:  MMT Right eval Left eval  Hip flexion 4- 4  Hip extension 4- 4  Hip abduction 4- 4  Hip adduction      Hip internal rotation      Hip external rotation      Knee flexion 4- 4  Knee extension 3+ 4  Ankle dorsiflexion      Ankle plantarflexion 4- 4    Goal status: INITIAL   4.  Increase  AROM R knee to 115d flexion and -2d extension Baseline:    R L  Knee flexion 109/112d 125  Knee extension -4/-2d 0    Goal status: INITIAL   5.  Decrease pain levels to 4/10 at worst Baseline: 5/10 at worst Goal status: INITIAL         PLAN:   PT FREQUENCY: 2x/week   PT DURATION: 6 weeks   PLANNED INTERVENTIONS: Therapeutic exercises, Therapeutic activity, Neuromuscular re-education, Balance training, Gait training, Patient/Family education, Self Care, Joint mobilization, Stair training, DME instructions, Dry Needling, Electrical stimulation, Cryotherapy, Moist heat, scar mobilization, Manual therapy, and Re-evaluation   PLAN FOR NEXT SESSION: HEP review  and f/u, aerobic work, R knee A/PROM, RLE strengthening focus on quad, gait and balance training   Margarette Canada, PTA 12/17/2022, 5:45 PM

## 2022-12-22 NOTE — Therapy (Unsigned)
OUTPATIENT PHYSICAL THERAPY TREATMENT NOTE/DC SUMMARY   Patient Name: Katie Burns MRN: [id number] DOB:1945/10/15, 77 y.o., female 1 Date: 12/23/2022  PCP: Ladell Pier, MD   REFERRING PROVIDER: Aundra Dubin, PA-C  PHYSICAL THERAPY DISCHARGE SUMMARY  Visits from Start of Care: 6  Current functional level related to goals / functional outcomes: Goals met   Remaining deficits: none   Education / Equipment: HEP   Patient agrees to discharge. Patient goals were met. Patient is being discharged due to being pleased with the current functional level.  END OF SESSION:   PT End of Session - 12/23/22 1755     Visit Number 6    Number of Visits 12    Date for PT Re-Evaluation 01/26/23    Authorization Type MCR    PT Start Time 1755    Activity Tolerance Patient tolerated treatment well    Behavior During Therapy [facility] for tasks assessed/performed               Past Medical History:  Diagnosis Date   Hyperlipidemia    Hypertension    Past Surgical History:  Procedure Laterality Date   CATARACT EXTRACTION, BILATERAL Bilateral 2017   with lens implant   COLONOSCOPY     EYE SURGERY Bilateral 2015   cataract extractionwith lens implant   TOTAL KNEE ARTHROPLASTY Right 10/26/2022   Procedure: RIGHT TOTAL KNEE ARTHROPLASTY;  Surgeon: Leandrew Koyanagi, MD;  Location: [place];  Service: Orthopedics;  Laterality: Right;   Patient Active Problem List   Diagnosis Date Noted   Primary osteoarthritis of right knee 10/26/2022   Status post total right knee replacement 10/26/2022   Prediabetes 11/19/2019   Essential hypertension 10/18/2017   Hyperlipidemia 10/18/2017   Primary osteoarthritis of both knees 10/18/2017   Obesity (BMI 30.0-34.9) 10/18/2017    REFERRING DIAG: Unilateral primary osteoarthritis, right knee   THERAPY DIAG:  Total knee replacement status, right  Other abnormalities of gait and mobility  Muscle weakness (generalized)  Rationale for  Evaluation and Treatment Rehabilitation  PERTINENT HISTORY: Patient is a pleasant 77 year old female who comes in today 2 weeks status post right total knee replacement 10/26/2022. She has been doing well. She has been getting physical therapy and is ambulating with a walker. She has been compliant taking a baby aspirin twice daily for DVT prophylaxis. Examination of her right knee reveals a well-healed surgical incision with nylon sutures in place. No evidence of infection or cellulitis. Calves are soft nontender. She is neurovascular intact distally. Today, sutures were removed and Steri-Strips applied. I have sent in a referral for outpatient physical therapy. I refilled her Percocet and Robaxin. She will continue with a baby aspirin twice daily for DVT prophylaxis. Dental prophylaxis reinforced. Follow-up in 4 weeks for repeat evaluation and 2 view x-rays of the right knee. Call with concerns or questions.   PRECAUTIONS: None  ONSET DATE: 10/26/22   SUBJECTIVE:  SUBJECTIVE STATEMENT:  Knee pain minimal 2-3/10 upon waking resolved with Tylenol.  Has returned t0 all ADLs w/o restriction    PAIN:  Are you having pain? Yes: NPRS scale: 2-3/10 Pain location: R knee Pain description: ache Aggravating factors: walking and exercise Relieving factors: rest meds   OBJECTIVE: (objective measures completed at initial evaluation unless otherwise dated)   DIAGNOSTIC FINDINGS: see imaging   PATIENT SURVEYS:  Patient-specific activity scoring scheme (Point to one number): 0 1 2 3 4 5 6 7 8 9  10 (Date and Score) Activity Initial   Activity Eval   12/23/22  standing  6  8  stairs  6  8  Arising from a chair 6  8    Additional Additional Total score = sum of the activity scores/number of activities Minimum detectable  change (90%CI) for average score = 2 points Minimum detectable change (90%CI) for single activity score = 3 points PSFS developed by: Jillene Bucks., & Binkley, J. (1995). Assessing disability and change on individual  patients: a report of a patient specific measure. Physiotherapy [place], 47, [phone]. Reproduced with the permission of the authors   Score: 18/30 60% functional       MUSCLE LENGTH: Hamstrings: NT     POSTURE:  flexed R knee with gait   PALPATION: Unremarkable    LOWER EXTREMITY ROM:   A/PROM Right eval Left eval R 12/08/12 R 12/23/22  Hip flexion        Hip extension        Hip abduction        Hip adduction        Hip internal rotation        Hip external rotation        Knee flexion 109/112d 125 120/125 120d(AROM)  Knee extension -4/-2d 0  0d  Ankle dorsiflexion        Ankle plantarflexion        Ankle inversion        Ankle eversion         (Blank rows = not tested)   LOWER EXTREMITY MMT:   MMT Right eval Left eval R 12/23/22  Hip flexion 4- 4 4+  Hip extension 4- 4 4+  Hip abduction 4- 4 4+  Hip adduction       Hip internal rotation       Hip external rotation       Knee flexion 4- 4 4+  Knee extension 3+ 4 4+  Ankle dorsiflexion       Ankle plantarflexion 4- 4 4+  Ankle inversion       Ankle eversion        (Blank rows = not tested)   LOWER EXTREMITY SPECIAL TESTS:  deferred   FUNCTIONAL TESTS:  5 times sit to stand: 26s arms crossed; 12/23/22 11s arms crossed   GAIT: Distance walked: 70ft x2 Assistive device utilized: Single point cane Level of assistance: Complete Independence Comments: antalgic gait     TODAY'S TREATMENT:     [facility] Adult PT Treatment:                                                DATE: 12/23/22 Therapeutic Exercise: 276ft ambulation w/o SPC Negotiation of 8 steps with step to pattern using B rails as well as SPC RE-assessment of progress  and goals Self Care: Discussion and verbal  review of HEP, ADL limitations and DC plans   [facility] Adult PT Treatment:                                                DATE: 12/17/22 Therapeutic Exercise: Nustep level 2 x 8 mins for ROM seat 4 Standing hip abduction/extension 2x10 BIL Standing hamstring curls 2x10 BIL Standing heel raises 2x10 Step ups R 4 in 10x  each fwd/lat Step ups R 6 in 10x fwd NMRE: Slow marching on airex x30" 1 UE support FT stance on Airex EO/EC x30"  Tandem stance on airex x30" BIL - occasional touchdown support to maintain balance   [facility] Adult PT Treatment:                                                DATE: 12/09/22 Therapeutic Exercise: Nustep level 2 x 8 mins for ROM seat 4 Supine hip fallouts RTB 10/10 Step ups R 4 in 10x Side stepping at counter 3 trips (EO, EC no UE support) Tandem and retrowalking at counter(EO, EC no UE support) Gait training with cane in alternating hands showing limp coming from LLE Supine heel slides over slide board with strap 10x f/b 5x with PT OP yielding 125d flexion   [facility] Adult PT Treatment:                                                DATE: 12/07/22 Therapeutic Exercise: Nustep level 2 x 6 mins for ROM seat 4 Tandem stance with OH reach over 4 in step 5/5 FAQs 2# R 10x w/adduction SLR 2# 15x SAQs 2# 15x Heel slides over slide board 15x (120d flexion) Standing hamstring curl 2# 10x  PF against wall 15x Marching 10/10 2# R against wall                  PATIENT EDUCATION:  Education details: Discussed eval findings, rehab rationale and POC and patient is in agreement  Person educated: Patient and Child(ren) Education method: Explanation and Demonstration Education comprehension: verbalized understanding and needs further education   HOME EXERCISE PROGRAM: Access Code: [email or web address] URL: [email or web address] Date: 11/30/2022 Prepared by: Sharlynn Oliphant   Exercises - Active Straight Leg Raise with Quad Set  - 3 x daily - 5 x weekly - 1 sets - 10  reps - Supine Knee Extension Strengthening  - 3 x daily - 5 x weekly - 1 sets - 10 reps - Seated Long Arc Quad  - 3 x daily - 5 x weekly - 1 sets - 10 reps - Standing Heel Raise with Chair Support  - 3 x daily - 5 x weekly - 3 sets - 10 reps - Supine Heel Slides  - 3 x daily - 5 x weekly - 3 sets - 10 reps   ASSESSMENT:   CLINICAL IMPRESSION: Rehab goals met, patient and CG confident to DC to HEP    OBJECTIVE IMPAIRMENTS: Abnormal gait, decreased activity tolerance, decreased balance, decreased endurance, decreased knowledge of condition, decreased knowledge of use of DME,  decreased mobility, difficulty walking, decreased ROM, decreased strength, impaired perceived functional ability, improper body mechanics, postural dysfunction, and pain.    ACTIVITY LIMITATIONS: carrying, lifting, sitting, standing, squatting, and stairs   PERSONAL FACTORS: Age, Fitness, Social background, and 1 comorbidity: OA  are also affecting patient's functional outcome.    REHAB POTENTIAL: Good   CLINICAL DECISION MAKING: Stable/uncomplicated   EVALUATION COMPLEXITY: Low     GOALS: Goals reviewed with patient? No   SHORT TERM GOALS: Target date: 12/21/2022   Patient to demonstrate independence in HEP  Baseline:[id number] Goal status: INITIAL   2.  257ft ambulation with SPC and step through pattern Baseline: 60ft x2 with SPC and step through pattern Goal status: INITIAL       LONG TERM GOALS: Target date: 01/12/2023     Decrease 5x STS to 20s arms crossed Baseline: 26s arms crossed; 12/23/22 11s  Goal status: Met   2.  Increase PSFS to 24/30 80% Baseline: 18/30 60%; 12/23/22 24/30 80% Goal status: Met   3.  Increase RLE strength to 4/5 in all deficit regions Baseline:  MMT Right eval Left eval R 12/23/22  Hip flexion 4- 4 4+  Hip extension 4- 4 4+  Hip abduction 4- 4 4+  Hip adduction       Hip internal rotation       Hip external rotation       Knee flexion 4- 4 4+  Knee extension 3+  4 4+  Ankle dorsiflexion       Ankle plantarflexion 4- 4 4+    Goal status: Met   4.  Increase AROM R knee to 115d flexion and -2d extension Baseline:    R L  Knee flexion 109/112d 125  Knee extension -4/-2d 0    Goal status: Met   5.  Decrease pain levels to 4/10 at worst Baseline: 5/10 at worst; 12/23/22 2-3/10 at worst and only briefly Goal status: Met         PLAN:   PT FREQUENCY: 2x/week   PT DURATION: 6 weeks   PLANNED INTERVENTIONS: Therapeutic exercises, Therapeutic activity, Neuromuscular re-education, Balance training, Gait training, Patient/Family education, Self Care, Joint mobilization, Stair training, DME instructions, Dry Needling, Electrical stimulation, Cryotherapy, Moist heat, scar mobilization, Manual therapy, and Re-evaluation   PLAN FOR NEXT SESSION: HEP review  and f/u, aerobic work, R knee A/PROM, RLE strengthening focus on quad, gait and balance training   Lanice Shirts, PT 12/23/2022, 6:31 PM

## 2022-12-23 ENCOUNTER — Ambulatory Visit: Payer: Medicare Other

## 2022-12-23 DIAGNOSIS — Z96651 Presence of right artificial knee joint: Secondary | ICD-10-CM

## 2022-12-23 DIAGNOSIS — R2689 Other abnormalities of gait and mobility: Secondary | ICD-10-CM

## 2022-12-23 DIAGNOSIS — M6281 Muscle weakness (generalized): Secondary | ICD-10-CM

## 2022-12-28 ENCOUNTER — Ambulatory Visit: Payer: Medicare Other

## 2022-12-31 ENCOUNTER — Ambulatory Visit: Payer: Medicare Other

## 2023-01-04 ENCOUNTER — Ambulatory Visit: Payer: Medicare Other

## 2023-01-19 ENCOUNTER — Encounter: Payer: Self-pay | Admitting: Orthopaedic Surgery

## 2023-01-19 ENCOUNTER — Ambulatory Visit (INDEPENDENT_AMBULATORY_CARE_PROVIDER_SITE_OTHER): Payer: Medicare Other | Admitting: Orthopaedic Surgery

## 2023-01-19 ENCOUNTER — Other Ambulatory Visit (INDEPENDENT_AMBULATORY_CARE_PROVIDER_SITE_OTHER): Payer: Medicare Other

## 2023-01-19 DIAGNOSIS — Z96651 Presence of right artificial knee joint: Secondary | ICD-10-CM | POA: Diagnosis not present

## 2023-01-19 DIAGNOSIS — M1712 Unilateral primary osteoarthritis, left knee: Secondary | ICD-10-CM | POA: Diagnosis not present

## 2023-01-19 NOTE — Progress Notes (Signed)
Office Visit Note   Patient: Katie Burns           Date of Birth: 21-Oct-1945           MRN: [id number] Visit Date: 01/19/2023              Requested by: Marcine Matar, MD [address] [place],  [place] [address] PCP: Marcine Matar, MD   Assessment & Plan: Visit Diagnoses:  1. Status post total right knee replacement   2. Primary osteoarthritis of left knee     Plan: Patient has done extremely well from her knee replacement and very happy.  Dental prophylaxis reinforced.  At this point she would like to make arrangements for left total knee replacement towards the end of the summer in July.  We will get preoperative x-rays of the left knee today.  Eunice Blase will meet with the patient to confirm surgery date.  Impression is severe left knee degenerative joint disease secondary to Osteoarthritis.  Bone on bone joint space narrowing is seen on radiographs with significant varus alignment.  At this point, conservative treatments fail to provide any significant relief and the pain is severely affecting ADLs and quality of life.  Based on treatment options, the patient has elected to move forward with a knee replacement.  We have discussed the surgical risks that include but are not limited to infection, DVT, leg length discrepancy, stiffness, numbness, tingling, incomplete relief of pain.  Recovery and prognosis were also reviewed.    Anticoagulants: aspirin 81 mg daily Postop anticoagulation: Aspirin 81 mg Diabetic: No  Nickel allergy: No Prior DVT/PE: No Tobacco use: No Clearances needed for surgery: None Anticipated discharge dispo: Home   Follow-Up Instructions: No follow-ups on file.   Orders:  No orders of the defined types were placed in this encounter.  No orders of the defined types were placed in this encounter.     Procedures: No procedures performed   Clinical Data: No additional findings.   Subjective: Chief Complaint  Patient presents  with   Right Knee - Follow-up    Right total knee arthroplasty 10/26/2022    HPI  Patient returns today for 80-month postop check status post right total knee replacement.  Doing well has no complaints.  Eager to have the left knee replaced.  Did a steroid injection about 1 month ago which did not provide any relief.  Review of Systems   Objective: Vital Signs: There were no vitals taken for this visit.  Physical Exam  Ortho Exam  Right knee shows fully healed surgical scar.  Excellent range of motion.  Good varus valgus stability. Examination of the left knee shows varus deformity.  Medial joint line tenderness.  Pain and patellofemoral crepitus with range of motion.  Specialty Comments:  No specialty comments available.  Imaging: No results found.   PMFS History: Patient Active Problem List   Diagnosis Date Noted   Primary osteoarthritis of left knee 01/19/2023   Primary osteoarthritis of right knee 10/26/2022   Status post total right knee replacement 10/26/2022   Prediabetes 11/19/2019   Essential hypertension 10/18/2017   Hyperlipidemia 10/18/2017   Primary osteoarthritis of both knees 10/18/2017   Obesity (BMI 30.0-34.9) 10/18/2017   Past Medical History:  Diagnosis Date   Hyperlipidemia    Hypertension     Family History  Problem Relation Age of Onset   Hypertension Daughter    Breast cancer Neg Hx     Past Surgical  History:  Procedure Laterality Date   CATARACT EXTRACTION, BILATERAL Bilateral 2017   with lens implant   COLONOSCOPY     EYE SURGERY Bilateral 2015   cataract extractionwith lens implant   TOTAL KNEE ARTHROPLASTY Right 10/26/2022   Procedure: RIGHT TOTAL KNEE ARTHROPLASTY;  Surgeon: Tarry Kos, MD;  Location: [facility] OR;  Service: Orthopedics;  Laterality: Right;   Social History   Occupational History   Occupation: unempolyed  Tobacco Use   Smoking status: Never   Smokeless tobacco: Never  Vaping Use   Vaping Use: Never used   Substance and Sexual Activity   Alcohol use: No   Drug use: No   Sexual activity: Not on file

## 2023-01-20 ENCOUNTER — Other Ambulatory Visit: Payer: Self-pay | Admitting: Physician Assistant

## 2023-01-21 ENCOUNTER — Ambulatory Visit: Payer: Medicare Other | Admitting: Internal Medicine

## 2023-02-04 ENCOUNTER — Encounter: Payer: Self-pay | Admitting: Internal Medicine

## 2023-02-04 ENCOUNTER — Ambulatory Visit: Payer: Medicare (Managed Care) | Attending: Internal Medicine | Admitting: Internal Medicine

## 2023-02-04 VITALS — BP 125/81 | HR 88 | Ht 60.0 in | Wt 163.6 lb

## 2023-02-04 DIAGNOSIS — I10 Essential (primary) hypertension: Secondary | ICD-10-CM | POA: Diagnosis not present

## 2023-02-04 DIAGNOSIS — Z23 Encounter for immunization: Secondary | ICD-10-CM | POA: Diagnosis not present

## 2023-02-04 DIAGNOSIS — D509 Iron deficiency anemia, unspecified: Secondary | ICD-10-CM | POA: Diagnosis not present

## 2023-02-04 DIAGNOSIS — E785 Hyperlipidemia, unspecified: Secondary | ICD-10-CM

## 2023-02-04 NOTE — Progress Notes (Signed)
Patient ID: Katie Burns, female    DOB: 01/16/1946  MRN: [id number]  CC: Hypertension   Subjective: Katie Burns is a 77 y.o. female who presents for chronic ds management.  Patient's son-in-law, Dineen Kid, is with her and she requests that he interprets.  We have offered our interpreter on video but patient prefers to have her son-in-law interprets.  Waiver was signed. Her concerns today include:  Pt with hx of preDM, HTN, HL, LBP, obesity, IDA and OA knees, COVID-19 infection 03/2019.    Since last visit with me, she has had total knee replacement on the right knee.  Reports that she is coming along well.  She has had decreased swelling of the knee.  She is ambulating with a cane.  HTN: She tells me that metoprolol was discontinued while she was in hospital for knee replacement as blood pressure was low.  So she has been off of it since January.  Taking Norvasc 5 mg daily and furosemide 20 mg daily.  Checks blood pressure daily.  Reports range of 120s/70-80.  No chest pains or shortness of breath.  HL: Taking atorvastatin 20 mg daily.  Tolerating medication.  Anemia: Taking iron daily.  H/H prior to surgery 12/36.6.  Postsurgery 10.4/30.4.  Denies any dizziness.  HM: Due for shingles vaccine.  I explained to her what is shingles and the recommendation for vaccination.  She is agreeable to receiving the first Shingrix shot today. Patient Active Problem List   Diagnosis Date Noted   Primary osteoarthritis of left knee 01/19/2023   Primary osteoarthritis of right knee 10/26/2022   Status post total right knee replacement 10/26/2022   Prediabetes 11/19/2019   Essential hypertension 10/18/2017   Hyperlipidemia 10/18/2017   Primary osteoarthritis of both knees 10/18/2017   Obesity (BMI 30.0-34.9) 10/18/2017     Current Outpatient Medications on File Prior to Visit  Medication Sig Dispense Refill   amLODipine (NORVASC) 5 MG tablet Take 1 tablet (5 mg total) by mouth daily. 90 tablet 1    aspirin EC 81 MG tablet Take 1 tablet (81 mg total) by mouth 2 (two) times daily. To be taken after surgery to prevent blood clots 84 tablet 0   atorvastatin (LIPITOR) 20 MG tablet TAKE 1 TABLET(20 MG) BY MOUTH DAILY 90 tablet 1   Blood Pressure Monitor DEVI Use as directed to check home blood pressure 2-3 times a week 1 Device 0   Cholecalciferol (VITAMIN D3 PO) Take 1 tablet by mouth daily.     cycloSPORINE (RESTASIS) 0.05 % ophthalmic emulsion Place 1 drop into both eyes daily as needed (dry/irritated eyes).     diclofenac Sodium (VOLTAREN) 1 % GEL Apply 2 g topically 4 (four) times daily. (Patient taking differently: Apply 2 g topically in the morning and at bedtime.) 350 g 1   docusate sodium (COLACE) 100 MG capsule Take 1 capsule (100 mg total) by mouth daily as needed. 30 capsule 2   ferrous sulfate (FEROSUL) 325 (65 FE) MG tablet Take one tab PO Q Mon/Wed and Frid. (Patient taking differently: Take 325 mg by mouth daily.) 90 tablet 1   furosemide (LASIX) 20 MG tablet TAKE 1 TABLET(20 MG) BY MOUTH DAILY 90 tablet 1   MAGNESIUM PO Take 1 tablet by mouth daily.     methocarbamol (ROBAXIN-750) 750 MG tablet Take 1 tablet (750 mg total) by mouth 2 (two) times daily as needed for muscle spasms. 20 tablet 2   Multiple Vitamins-Minerals (CENTRUM SILVER ADULT 50+ PO)  Take 1 tablet by mouth daily.     ondansetron (ZOFRAN) 4 MG tablet Take 1 tablet (4 mg total) by mouth every 8 (eight) hours as needed for nausea or vomiting. 40 tablet 0   oxyCODONE-acetaminophen (PERCOCET) 5-325 MG tablet Take 1-2 tablets by mouth every 8 (eight) hours as needed. To be taken after surgery 40 tablet 0   No current facility-administered medications on file prior to visit.    Allergies  Allergen Reactions   Beef-Derived Products    Chicken Protein    Fish-Derived Products    Pork-Derived Products     Social History   Socioeconomic History   Marital status: Married    Spouse name: Not on file   Number of  children: 6   Years of education: 5 th grade   Highest education level: Not on file  Occupational History   Occupation: unempolyed  Tobacco Use   Smoking status: Never   Smokeless tobacco: Never  Vaping Use   Vaping Use: Never used  Substance and Sexual Activity   Alcohol use: No   Drug use: No   Sexual activity: Not on file  Other Topics Concern   Not on file  Social History Narrative   Not on file   Social Determinants of Health   Financial Resource Strain: Not on file  Food Insecurity: No Food Insecurity (10/26/2022)   Hunger Vital Sign    Worried About Running Out of Food in the Last Year: Never true    Ran Out of Food in the Last Year: Never true  Transportation Needs: No Transportation Needs (10/26/2022)   PRAPARE - Administrator, Civil Service (Medical): No    Lack of Transportation (Non-Medical): No  Physical Activity: Not on file  Stress: Not on file  Social Connections: Not on file  Intimate Partner Violence: Not At Risk (10/26/2022)   Humiliation, Afraid, Rape, and Kick questionnaire    Fear of Current or Ex-Partner: No    Emotionally Abused: No    Physically Abused: No    Sexually Abused: No    Family History  Problem Relation Age of Onset   Hypertension Daughter    Breast cancer Neg Hx     Past Surgical History:  Procedure Laterality Date   CATARACT EXTRACTION, BILATERAL Bilateral 2017   with lens implant   COLONOSCOPY     EYE SURGERY Bilateral 2015   cataract extractionwith lens implant   TOTAL KNEE ARTHROPLASTY Right 10/26/2022   Procedure: RIGHT TOTAL KNEE ARTHROPLASTY;  Surgeon: Tarry Kos, MD;  Location: [facility] OR;  Service: Orthopedics;  Laterality: Right;    ROS: Review of Systems Negative except as stated above  PHYSICAL EXAM: BP 125/81   Pulse 88   Ht 5' (1.524 m)   Wt 163 lb 9.6 oz (74.2 kg)   SpO2 98%   BMI 31.95 kg/m   Physical Exam  General appearance - alert, well appearing, elderly female and in no  distress Mental status - normal mood, behavior, speech, dress, motor activity, and thought processes Chest - clear to auscultation, no wheezes, rales or rhonchi, symmetric air entry Heart - normal rate, regular rhythm, normal S1, S2, no murmurs, rubs, clicks or gallops Extremities -mild edema of the right knee and right lower leg.  Surgical wound well-healed over the right knee.      Latest Ref Rng & Units 10/20/2022   10:27 AM 08/06/2022    2:21 PM 09/11/2021    4:30 PM  CMP  Glucose 70 - 99 mg/dL 87  161  98   BUN 8 - 27 mg/dL 17  29  16    Creatinine 0.57 - 1.00 mg/dL 0.96  0.45  4.09   Sodium 134 - 144 mmol/L 137  139  137   Potassium 3.5 - 5.2 mmol/L 4.7  4.8  4.3   Chloride 96 - 106 mmol/L 102  102  99   CO2 20 - 29 mmol/L 20  21  22    Calcium 8.7 - 10.3 mg/dL 9.9  81.1  9.7   Total Protein 6.0 - 8.5 g/dL 7.2  7.3  7.4   Total Bilirubin 0.0 - 1.2 mg/dL 0.4  <9.1  <4.7   Alkaline Phos 44 - 121 IU/L 92  92  92   AST 0 - 40 IU/L 22  17  16    ALT 0 - 32 IU/L 12  8  12     Lipid Panel     Component Value Date/Time   CHOL 191 08/06/2022 1421   TRIG 248 (H) 08/06/2022 1421   HDL 57 08/06/2022 1421   CHOLHDL 3.4 08/06/2022 1421   LDLCALC 93 08/06/2022 1421    CBC    Component Value Date/Time   WBC 10.6 (H) 10/27/2022 0502   RBC 3.30 (L) 10/27/2022 0502   HGB 10.4 (L) 10/27/2022 0502   HGB 12.0 10/20/2022 1027   HCT 30.4 (L) 10/27/2022 0502   HCT 36.6 10/20/2022 1027   PLT 169 10/27/2022 0502   PLT 228 10/20/2022 1027   MCV 92.1 10/27/2022 0502   MCV 92 10/20/2022 1027   MCH 31.5 10/27/2022 0502   MCHC 34.2 10/27/2022 0502   RDW 11.6 10/27/2022 0502   RDW 11.7 10/20/2022 1027   LYMPHSABS 3.2 (H) 10/20/2022 1027   EOSABS 0.2 10/20/2022 1027   BASOSABS 0.1 10/20/2022 1027    ASSESSMENT AND PLAN: 1. Essential hypertension Close to goal.  Continue Norvasc 5 mg daily and furosemide 20 mg daily.  Metoprolol removed from med list.  2. Hyperlipidemia, unspecified  hyperlipidemia type Continue atorvastatin 20 mg daily.  Last LDL of 93 was at goal for her.  3. Iron deficiency anemia, unspecified iron deficiency anemia type - CBC - Iron, TIBC and Ferritin Panel  4. Need for shingles vaccine Advised that the vaccine can cause some redness and swelling at the injection site.  Advised that it is a 2 vaccine series, the second shot to be given 2 to 6 months later. - Varicella-zoster vaccine IM     Patient was given the opportunity to ask questions.  Patient verbalized understanding of the plan and was able to repeat key elements of the plan.   This documentation was completed using Paediatric nurse.  Any transcriptional errors are unintentional.  Orders Placed This Encounter  Procedures   Varicella-zoster vaccine IM   CBC   Iron, TIBC and Ferritin Panel     Requested Prescriptions    No prescriptions requested or ordered in this encounter    Return in about 4 months (around 06/07/2023) for Give appt with CMA in 2-wks for Medicare Wellness Visiit.  Jonah Blue, MD, FACP

## 2023-02-04 NOTE — Progress Notes (Signed)
Shingrix vaccine administered per protocols. Given in right deltoid   Information sheet given. Patient denies and pain or discomfort at injection site. Tolerated injection well no reaction.

## 2023-02-05 LAB — IRON,TIBC AND FERRITIN PANEL
Ferritin: 87 ng/mL (ref 15–150)
Iron Saturation: 13 % — ABNORMAL LOW (ref 15–55)
Iron: 42 ug/dL (ref 27–139)
Total Iron Binding Capacity: 312 ug/dL (ref 250–450)
UIBC: 270 ug/dL (ref 118–369)

## 2023-02-05 LAB — CBC
Hematocrit: 38.3 % (ref 34.0–46.6)
Hemoglobin: 12.3 g/dL (ref 11.1–15.9)
MCH: 29.7 pg (ref 26.6–33.0)
MCHC: 32.1 g/dL (ref 31.5–35.7)
MCV: 93 fL (ref 79–97)
Platelets: 211 10*3/uL (ref 150–450)
RBC: 4.14 x10E6/uL (ref 3.77–5.28)
RDW: 12.8 % (ref 11.7–15.4)
WBC: 11.3 10*3/uL — ABNORMAL HIGH (ref 3.4–10.8)

## 2023-02-11 ENCOUNTER — Ambulatory Visit: Payer: Medicare Other | Admitting: Physician Assistant

## 2023-02-15 ENCOUNTER — Ambulatory Visit: Payer: Self-pay

## 2023-02-15 NOTE — Telephone Encounter (Signed)
Noted  

## 2023-02-15 NOTE — Telephone Encounter (Signed)
  Chief Complaint: Urinary Urgency - burning, heavy feeling, frequency Symptoms: above Frequency: 1-2 weeks Pertinent Negatives: Patient denies fever Disposition: [] ED /[x] Urgent Care (no appt availability in office) / [] Appointment(In office/virtual)/ []  [facility]/ [] Home Care/ [] Refused Recommended Disposition /[] [facility]/ []  Follow-up with PCP Additional Notes: Spoke with pt's daughter, Paramjit. She states that her mother has had s/s of 1.5 to 2 weeks. No appointments in office . Daughter will take pt to UC this evening or tomorrow.    Reason for Disposition  Side (flank) or lower back pain present  Answer Assessment - Initial Assessment Questions 1. SYMPTOM: "What's the main symptom you're concerned about?" (e.g., frequency, incontinence)     Burning with urination, urgency, heaviness 2. ONSET: "When did the  S/s  start?"     1-2 weeks 3. PAIN: "Is there any pain?" If Yes, ask: "How bad is it?" (Scale: 1-10; mild, moderate, severe)     Yes - burning 4. CAUSE: "What do you think is causing the symptoms?"     UTI 5. OTHER SYMPTOMS: "Do you have any other symptoms?" (e.g., blood in urine, fever, flank pain, pain with urination)     No fever  Protocols used: Urinary Symptoms-A-AH

## 2023-02-26 ENCOUNTER — Telehealth: Payer: Self-pay

## 2023-02-26 NOTE — Telephone Encounter (Signed)
Pt given lab results per notes of Dr. Laural Benes from 02/04/23 on 02/26/23. Pt verbalized understanding.

## 2023-03-11 ENCOUNTER — Telehealth: Payer: Self-pay

## 2023-03-11 NOTE — Telephone Encounter (Signed)
Attempted to contact patient's daughter, Paramjit, to inform her that the [facility] referral that was dropped off was faxed to [facility]. Call placed with assistance of Hindi interpreter [id number]/[employer], message left with call back requested.

## 2023-03-23 ENCOUNTER — Telehealth: Payer: Self-pay

## 2023-03-23 NOTE — Telephone Encounter (Signed)
I spoke to patient's daughter, Katie Burns , and explained that the request for personal care services was faxed to [facility]: 03/11/2023.  I also explained that [facility] wll be contacting her to schedule an in home assessment of her mother's needs and it can take a few weeks to get that scheduled  That evaluation will determine if she is eligible for services and if so, how many hours/week. I told her to call me back if she has any further questions or has not heard from them in the next week.

## 2023-04-12 DIAGNOSIS — M1712 Unilateral primary osteoarthritis, left knee: Secondary | ICD-10-CM

## 2023-04-20 ENCOUNTER — Other Ambulatory Visit: Payer: Self-pay | Admitting: Family Medicine

## 2023-04-20 DIAGNOSIS — I10 Essential (primary) hypertension: Secondary | ICD-10-CM

## 2023-04-27 ENCOUNTER — Encounter: Payer: Medicare Other | Admitting: Physician Assistant

## 2023-05-11 ENCOUNTER — Other Ambulatory Visit: Payer: Self-pay | Admitting: Family Medicine

## 2023-05-11 DIAGNOSIS — Z1231 Encounter for screening mammogram for malignant neoplasm of breast: Secondary | ICD-10-CM

## 2023-05-14 NOTE — Progress Notes (Signed)
Surgical Instructions    Your procedure is scheduled on Monday September 9th.  Report to [facility] Main Entrance "A" at 5:30 A.M., then check in with the Admitting office.  Call this number if you have problems the morning of surgery:  [phone]   If you have any questions prior to your surgery date call [phone]: Open Monday-Friday 8am-4pm If you experience any cold or flu symptoms such as cough, fever, chills, shortness of breath, etc. between now and your scheduled surgery, please notify us at the above number     Remember:  Do not eat after midnight the night before your surgery  You may drink clear liquids until 4:30am the morning of your surgery.   Clear liquids allowed are: Water, Non-Citrus Juices (without pulp), Carbonated Beverages, Clear Tea, Black Coffee ONLY (NO MILK, CREAM OR POWDERED CREAMER of any kind), and Gatorade    Enhanced Recovery after Surgery for Orthopedics Enhanced Recovery after Surgery is a protocol used to improve the stress on your body and your recovery after surgery.  Patient Instructions  The day of surgery (if you do NOT have diabetes):  Drink ONE (1) Pre-Surgery Clear Ensure by __4:30___ am the morning of surgery   This drink was given to you during your hospital  pre-op appointment visit. Nothing else to drink after completing the  Pre-Surgery Clear Ensure.          If you have questions, please contact your surgeon's office.       Take these medicines the morning of surgery with A SIP OF WATER: atorvastatin (LIPITOR) 20 MG tablet  omeprazole (PRILOSEC) 40 MG capsule  ferrous sulfate (FEROSUL) 325 (65 FE) MG tablet    IF NEEDED  cycloSPORINE (RESTASIS) 0.05 % ophthalmic emulsion    As of today, STOP taking any Aspirin (unless otherwise instructed by your surgeon) VOLTAREN, Aleve, Naproxen, Ibuprofen, Motrin, Advil, Goody's, BC's, all herbal medications, fish oil, and all vitamins.           Do not wear jewelry or  makeup. Do not wear lotions, powders, perfumes or deodorant. Do not shave 48 hours prior to surgery.  Do not bring valuables to the hospital. Do not wear nail polish, gel polish, artificial nails, or any other type of covering on natural nails (fingers and toes) If you have artificial nails or gel coating that need to be removed by a nail salon, please have this removed prior to surgery. Artificial nails or gel coating may interfere with anesthesia's ability to adequately monitor your vital signs.  [facility] is not responsible for any belongings or valuables.    Do NOT Smoke (Tobacco/Vaping)  24 hours prior to your procedure  If you use a CPAP at night, you may bring your mask for your overnight stay.   Contacts, glasses, hearing aids, dentures or partials may not be worn into surgery, please bring cases for these belongings   For patients admitted to the hospital, discharge time will be determined by your treatment team.   Patients discharged the day of surgery will not be allowed to drive home, and someone needs to stay with them for 24 hours.   SURGICAL WAITING ROOM VISITATION Patients having surgery or a procedure may have no more than 2 support people in the waiting area - these visitors may rotate.   Children under the age of 37 must have an adult with them who is not the patient. If the patient needs to stay at the hospital during part of  their recovery, the visitor guidelines for inpatient rooms apply. Pre-op nurse will coordinate an appropriate time for 1 support person to accompany patient in pre-op.  This support person may not rotate.   Please refer to [email or web address] for the visitor guidelines for Inpatients (after your surgery is over and you are in a regular room).    Special instructions:    Oral Hygiene is also important to reduce your risk of infection.  Remember - BRUSH YOUR TEETH THE MORNING OF SURGERY WITH YOUR  REGULAR TOOTHPASTE     Pre-operative 5 CHG Bath Instructions   You can play a key role in reducing the risk of infection after surgery. Your skin needs to be as free of germs as possible. You can reduce the number of germs on your skin by washing with CHG (chlorhexidine gluconate) soap before surgery. CHG is an antiseptic soap that kills germs and continues to kill germs even after washing.   DO NOT use if you have an allergy to chlorhexidine/CHG or antibacterial soaps. If your skin becomes reddened or irritated, stop using the CHG and notify one of our RNs at [phone].   Please shower with the CHG soap starting 4 days before surgery using the following schedule:     Please keep in mind the following:  DO NOT shave, including legs and underarms, starting the day of your first shower.   You may shave your face at any point before/day of surgery.  Place clean sheets on your bed the day you start using CHG soap. Use a clean washcloth (not used since being washed) for each shower. DO NOT sleep with pets once you start using the CHG.   CHG Shower Instructions:  If you choose to wash your hair and private area, wash first with your normal shampoo/soap.  After you use shampoo/soap, rinse your hair and body thoroughly to remove shampoo/soap residue.  Turn the water OFF and apply about 3 tablespoons (45 ml) of CHG soap to a CLEAN washcloth.  Apply CHG soap ONLY FROM YOUR NECK DOWN TO YOUR TOES (washing for 3-5 minutes)  DO NOT use CHG soap on face, private areas, open wounds, or sores.  Pay special attention to the area where your surgery is being performed.  If you are having back surgery, having someone wash your back for you may be helpful. Wait 2 minutes after CHG soap is applied, then you may rinse off the CHG soap.  Pat dry with a clean towel  Put on clean clothes/pajamas   If you choose to wear lotion, please use ONLY the CHG-compatible lotions on the back of this paper.      Additional instructions for the day of surgery: DO NOT APPLY any lotions, deodorants, cologne, or perfumes.   Put on clean/comfortable clothes.  Brush your teeth.  Ask your nurse before applying any prescription medications to the skin.      CHG Compatible Lotions   Aveeno Moisturizing lotion  Cetaphil Moisturizing Cream  Cetaphil Moisturizing Lotion  Clairol Herbal Essence Moisturizing Lotion, Dry Skin  Clairol Herbal Essence Moisturizing Lotion, Extra Dry Skin  Clairol Herbal Essence Moisturizing Lotion, Normal Skin  Curel Age Defying Therapeutic Moisturizing Lotion with Alpha Hydroxy  Curel Extreme Care Body Lotion  Curel Soothing Hands Moisturizing Hand Lotion  Curel Therapeutic Moisturizing Cream, Fragrance-Free  Curel Therapeutic Moisturizing Lotion, Fragrance-Free  Curel Therapeutic Moisturizing Lotion, Original Formula  Eucerin Daily Replenishing Lotion  Eucerin Dry Skin Therapy Plus Alpha Hydroxy Crme  Eucerin Dry Skin  Therapy Plus Alpha Hydroxy Lotion  Eucerin Original Crme  Eucerin Original Lotion  Eucerin Plus Crme Eucerin Plus Lotion  Eucerin TriLipid Replenishing Lotion  Keri Anti-Bacterial Hand Lotion  Keri Deep Conditioning Original Lotion Dry Skin Formula Softly Scented  Keri Deep Conditioning Original Lotion, Fragrance Free Sensitive Skin Formula  Keri Lotion Fast Absorbing Fragrance Free Sensitive Skin Formula  Keri Lotion Fast Absorbing Softly Scented Dry Skin Formula  Keri Original Lotion  Keri Skin Renewal Lotion Keri Silky Smooth Lotion  Keri Silky Smooth Sensitive Skin Lotion  Nivea Body Creamy Conditioning Oil  Nivea Body Extra Enriched Lotion  Nivea Body Original Lotion  Nivea Body Sheer Moisturizing Lotion Nivea Crme  Nivea Skin Firming Lotion  NutraDerm 30 Skin Lotion  NutraDerm Skin Lotion  NutraDerm Therapeutic Skin Cream  NutraDerm Therapeutic Skin Lotion  ProShield Protective Hand Cream  Provon moisturizing lotion    If you  received a COVID test during your pre-op visit, it is requested that you wear a mask when out in public, stay away from anyone that may not be feeling well, and notify your surgeon if you develop symptoms. If you have been in contact with anyone that has tested positive in the last 10 days, please notify your surgeon.    Please read over the following fact sheets that you were given.

## 2023-05-17 ENCOUNTER — Inpatient Hospital Stay (HOSPITAL_COMMUNITY)
Admission: RE | Admit: 2023-05-17 | Discharge: 2023-05-17 | Disposition: A | Discharge status: Home / Self Care | Payer: Medicare (Managed Care) | Source: Ambulatory Visit

## 2023-05-21 ENCOUNTER — Other Ambulatory Visit: Payer: Self-pay

## 2023-05-21 ENCOUNTER — Encounter (HOSPITAL_COMMUNITY)
Admission: RE | Admit: 2023-05-21 | Discharge: 2023-05-21 | Disposition: A | Discharge status: Home / Self Care | Payer: Medicare (Managed Care) | Source: Ambulatory Visit | Attending: Orthopaedic Surgery | Admitting: Orthopaedic Surgery

## 2023-05-21 ENCOUNTER — Encounter (HOSPITAL_COMMUNITY): Payer: Self-pay

## 2023-05-21 VITALS — BP 128/71 | HR 67 | Temp 98.3°F | Resp 18 | Ht 60.0 in | Wt 163.7 lb

## 2023-05-21 DIAGNOSIS — M1712 Unilateral primary osteoarthritis, left knee: Secondary | ICD-10-CM

## 2023-05-21 DIAGNOSIS — Z01818 Encounter for other preprocedural examination: Secondary | ICD-10-CM

## 2023-05-21 DIAGNOSIS — D509 Iron deficiency anemia, unspecified: Secondary | ICD-10-CM | POA: Diagnosis not present

## 2023-05-21 DIAGNOSIS — Z01812 Encounter for preprocedural laboratory examination: Secondary | ICD-10-CM | POA: Insufficient documentation

## 2023-05-21 HISTORY — DX: Unspecified osteoarthritis, unspecified site: M19.90

## 2023-05-21 HISTORY — DX: Prediabetes: R73.03

## 2023-05-21 HISTORY — DX: Gastro-esophageal reflux disease without esophagitis: K21.9

## 2023-05-21 LAB — CBC
HCT: 38.6 % (ref 36.0–46.0)
Hemoglobin: 12.9 g/dL (ref 12.0–15.0)
MCH: 30.9 pg (ref 26.0–34.0)
MCHC: 33.4 g/dL (ref 30.0–36.0)
MCV: 92.3 fL (ref 80.0–100.0)
Platelets: 222 10*3/uL (ref 150–400)
RBC: 4.18 MIL/uL (ref 3.87–5.11)
RDW: 11.9 % (ref 11.5–15.5)
WBC: 10.2 10*3/uL (ref 4.0–10.5)
nRBC: 0 % (ref 0.0–0.2)

## 2023-05-21 LAB — BASIC METABOLIC PANEL
Anion gap: 8 (ref 5–15)
BUN: 19 mg/dL (ref 8–23)
CO2: 25 mmol/L (ref 22–32)
Calcium: 9.5 mg/dL (ref 8.9–10.3)
Chloride: 101 mmol/L (ref 98–111)
Creatinine, Ser: 0.85 mg/dL (ref 0.44–1.00)
GFR, Estimated: >60 mL/min (ref >60)
Glucose, Bld: 91 mg/dL (ref 70–99)
Potassium: 4.7 mmol/L (ref 3.5–5.1)
Sodium: 134 mmol/L — ABNORMAL LOW (ref 135–145)

## 2023-05-21 LAB — SURGICAL PCR SCREEN
MRSA, PCR: NEGATIVE
Staphylococcus aureus: NEGATIVE

## 2023-05-21 NOTE — Progress Notes (Signed)
PCP - Dr. Jonah Blue  Cardiologist - no   EP-no  Endocrine-  Pulm-no  Chest x-ray - na  EKG - 09/28/23  Stress Test -   ECHO -   Cardiac Cath -   AICD-no PM-no LOOP-no  Nerve Stimulator-no  Dialysis-no  Sleep Study - no CPAP - no   LABS- CBC, BMP  ASA-post op  ERAS-yes  HA1C-Pre- diabetic - A1C 5.7- 08/06/22 GLP-1-no Fasting Blood Sugar - na Checks Blood Sugar ___na__ times a day  Katie Burns signed an interpreter release , given permission for Her Daughter , Katie Burns , to interpreter for her today.  There is a [place] interpreter scheduled to be with patient on the DOS.  Katie Burns. Denies = any s/s of upper or lower respiratory infection in the past 8 weeks.  Anesthesia-  Pt denies having chest pain, sob, or fever at this time. All instructions explained to the pt, with a verbal understanding of the material. Pt agrees to go over the instructions while at home for a better understanding. The opportunity to ask questions was provided.

## 2023-05-21 NOTE — Progress Notes (Signed)
Your procedure is scheduled on Monday September 9th.  Report to [facility] Main Entrance "A" at 5:30 A.M., then check in with the Admitting office.  Call this number if you have problems the morning of surgery:  [phone]- this is the pre- op desk   If you have any questions prior to your surgery date call [phone]: Open Monday-Friday 8am-4pm- PAT desk ask for any nurse. If you experience any cold or flu symptoms such as cough, fever, chills, shortness of breath, etc. between now and your scheduled surgery, please notify us at the above number     Remember:  Do not eat after midnight the night before your surgery You may drink clear liquids until 4:30am the morning of your surgery.   Clear liquids allowed are: Water, Non-Citrus Juices (without pulp), Carbonated Beverages, Clear Tea, Black Coffee ONLY (NO MILK, CREAM OR POWDERED CREAMER of any kind), and Gatorade Enhanced Recovery after Surgery for Orthopedics Enhanced Recovery after Surgery is a protocol used to improve the stress on your body and your recovery after surgery.  Patient Instructions  The day of surgery (if you do NOT have diabetes):  Drink ONE (1) Pre-Surgery Clear Ensure by __4:30___ am the morning of surgery   This drink was given to you during your hospital  pre-op appointment visit. Nothing else to drink after completing the  Pre-Surgery Clear Ensure.        If you have questions, please contact your surgeon's office.   Take these medicines the morning of surgery with A SIP OF WATER: atorvastatin (LIPITOR) 20 MG tablet  omeprazole (PRILOSEC) 40 MG capsule   IF NEEDED  cycloSPORINE (RESTASIS) 0.05 % ophthalmic emulsion   1 week prior to surgery: STOP taking any Aspirin (unless otherwise instructed by your surgeon) VOLTAREN, Aleve, Naproxen, Ibuprofen, Motrin, Advil, Goody's, BC's, all herbal medications, fish oil, and all vitamins.  Special instructions:    Oral Hygiene is also important to reduce your  risk of infection.  Remember - BRUSH YOUR TEETH THE MORNING OF SURGERY WITH YOUR REGULAR TOOTHPASTE     Pre-operative 5 CHG Bath Instructions   You can play a key role in reducing the risk of infection after surgery. Your skin needs to be as free of germs as possible. You can reduce the number of germs on your skin by washing with CHG (chlorhexidine gluconate) soap before surgery. CHG is an antiseptic soap that kills germs and continues to kill germs even after washing.   DO NOT use if you have an allergy to chlorhexidine/CHG or antibacterial soaps. If your skin becomes reddened or irritated, stop using the CHG and notify one of our RNs at [phone].   Please shower with the CHG soap starting 4 days before surgery using the following schedule:     Please keep in mind the following:  DO NOT shave, including legs and underarms, starting the day of your first shower.   You may shave your face at any point before/day of surgery.  Place clean sheets on your bed the day you start using CHG soap. Use a clean washcloth (not used since being washed) for each shower. DO NOT sleep with pets once you start using the CHG.   CHG Shower Instructions:  If you choose to wash your hair and private area, wash first with your normal shampoo/soap.  After you use shampoo/soap, rinse your hair and body thoroughly to remove shampoo/soap residue.  Turn the water OFF and apply about 3 tablespoons (45 ml)  of CHG soap to a CLEAN washcloth.  Apply CHG soap ONLY FROM YOUR NECK DOWN TO YOUR TOES (washing for 3-5 minutes)  DO NOT use CHG soap on face, private areas, open wounds, or sores.  Pay special attention to the area where your surgery is being performed.  If you are having back surgery, having someone wash your back for you may be helpful. Wait 2 minutes after CHG soap is applied, then you may rinse off the CHG soap.  Pat dry with a clean towel  Put on clean clothes/pajamas   If you choose to wear lotion,  please use ONLY the CHG-compatible lotions on the back of this paper.     Additional instructions for the day of surgery: DO NOT APPLY any lotions, deodorants, cologne, or perfumes.   Put on clean/comfortable clothes.  Brush your teeth.  Ask your nurse before applying any prescription medications to the skin.      CHG Compatible Lotions   Aveeno Moisturizing lotion  Cetaphil Moisturizing Cream  Cetaphil Moisturizing Lotion  Clairol Herbal Essence Moisturizing Lotion, Dry Skin  Clairol Herbal Essence Moisturizing Lotion, Extra Dry Skin  Clairol Herbal Essence Moisturizing Lotion, Normal Skin  Curel Age Defying Therapeutic Moisturizing Lotion with Alpha Hydroxy  Curel Extreme Care Body Lotion  Curel Soothing Hands Moisturizing Hand Lotion  Curel Therapeutic Moisturizing Cream, Fragrance-Free  Curel Therapeutic Moisturizing Lotion, Fragrance-Free  Curel Therapeutic Moisturizing Lotion, Original Formula  Eucerin Daily Replenishing Lotion  Eucerin Dry Skin Therapy Plus Alpha Hydroxy Crme  Eucerin Dry Skin Therapy Plus Alpha Hydroxy Lotion  Eucerin Original Crme  Eucerin Original Lotion  Eucerin Plus Crme Eucerin Plus Lotion  Eucerin TriLipid Replenishing Lotion  Keri Anti-Bacterial Hand Lotion  Keri Deep Conditioning Original Lotion Dry Skin Formula Softly Scented  Keri Deep Conditioning Original Lotion, Fragrance Free Sensitive Skin Formula  Keri Lotion Fast Absorbing Fragrance Free Sensitive Skin Formula  Keri Lotion Fast Absorbing Softly Scented Dry Skin Formula  Keri Original Lotion  Keri Skin Renewal Lotion Keri Silky Smooth Lotion  Keri Silky Smooth Sensitive Skin Lotion  Nivea Body Creamy Conditioning Oil  Nivea Body Extra Enriched Lotion  Nivea Body Original Lotion  Nivea Body Sheer Moisturizing Lotion Nivea Crme  Nivea Skin Firming Lotion  NutraDerm 30 Skin Lotion  NutraDerm Skin Lotion  NutraDerm Therapeutic Skin Cream  NutraDerm Therapeutic Skin Lotion   ProShield Protective Hand Cream  Provon moisturizing lotion If you received a COVID test during your pre-op visit, it is requested that you wear a mask when out in public, stay away from anyone that may not be feeling well, and notify your surgeon if you develop symptoms. If you have been in contact with anyone that has tested positive in the last 10 days, please notify your surgeon.  Please read over the following fact sheets that you were given.  1 week prior to surgery: STOP taking any Aspirin (unless otherwise instructed by your surgeon) VOLTAREN, Aleve, Naproxen, Ibuprofen, Motrin, Advil, Goody's, BC's, all herbal medications, fish oil, and all vitamins.           Do not wear jewelry or makeup. Do not wear lotions, powders, perfumes or deodorant. Do not shave 48 hours prior to surgery.  Do not bring valuables to the hospital. Do not wear nail polish, gel polish, artificial nails, or any other type of covering on natural nails (fingers and toes) If you have artificial nails or gel coating that need to be removed by a nail salon, please have  this removed prior to surgery. Artificial nails or gel coating may interfere with anesthesia's ability to adequately monitor your vital signs.  [facility] is not responsible for any belongings or valuables.    Do NOT Smoke (Tobacco/Vaping)  24 hours prior to your procedure  If you use a CPAP at night, you may bring your mask for your overnight stay.   Contacts, glasses, hearing aids, dentures or partials may not be worn into surgery, please bring cases for these belongings   For patients admitted to the hospital, discharge time will be determined by your treatment team.   Patients discharged the day of surgery will not be allowed to drive home, and someone needs to stay with them for 24 hours.   SURGICAL WAITING ROOM VISITATION Patients having surgery or a procedure may have no more than 2 support people in the waiting area - these visitors may  rotate.   Children under the age of 60 must have an adult with them who is not the patient. If the patient needs to stay at the hospital during part of their recovery, the visitor guidelines for inpatient rooms apply. Pre-op nurse will coordinate an appropriate time for 1 support person to accompany patient in pre-op.  This support person may not rotate.   Please refer to [email or web address] for the visitor guidelines for Inpatients (after your surgery is over and you are in a regular room).

## 2023-05-26 ENCOUNTER — Ambulatory Visit
Admission: RE | Admit: 2023-05-26 | Discharge: 2023-05-26 | Disposition: A | Discharge status: Home / Self Care | Payer: Medicare (Managed Care) | Source: Ambulatory Visit | Attending: Family Medicine | Admitting: Family Medicine

## 2023-05-26 DIAGNOSIS — Z1231 Encounter for screening mammogram for malignant neoplasm of breast: Secondary | ICD-10-CM

## 2023-05-27 ENCOUNTER — Other Ambulatory Visit: Payer: Self-pay

## 2023-06-04 MED ORDER — TRANEXAMIC ACID 1000 MG/10ML IV SOLN
2000.0000 mg | INTRAVENOUS | Status: AC
  Filled 2023-06-04: qty 20

## 2023-06-04 NOTE — Anesthesia Preprocedure Evaluation (Addendum)
Anesthesia Evaluation  Patient identified by MRN, date of birth, ID band Patient awake    Reviewed: Allergy & Precautions, NPO status , Patient's Chart, lab work & pertinent test results  History of Anesthesia Complications Negative for: history of anesthetic complications  Airway Mallampati: III  TM Distance: >3 FB Neck ROM: Full    Dental  (+) Dental Advisory Given   Pulmonary neg pulmonary ROS   Pulmonary exam normal breath sounds clear to auscultation       Cardiovascular hypertension (losartan, amlodipine), Pt. on medications and Pt. 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, incomplete RBBB)  Rhythm:Regular Rate:Normal  HLD   Neuro/Psych negative neurological ROS     GI/Hepatic Neg liver ROS,GERD  Medicated,,  Endo/Other  Pre-diabetes  Renal/GU negative Renal ROS     Musculoskeletal  (+) Arthritis ,    Abdominal  (+) + obese  Peds  Hematology  (+) Blood dyscrasia, anemia Lab Results      Component                Value               Date                      WBC                      10.2                05/21/2023                HGB                      12.9                05/21/2023                HCT                      38.6                05/21/2023                MCV                      92.3                05/21/2023                PLT                      222                 05/21/2023              Anesthesia Other Findings   Reproductive/Obstetrics                             Anesthesia Physical Anesthesia Plan  ASA: 2  Anesthesia Plan: Regional, MAC and Spinal   Post-op Pain Management: Regional block* and Tylenol PO (pre-op)*   Induction: Intravenous  PONV Risk Score and Plan: 2 and Ondansetron, Dexamethasone, Propofol infusion, TIVA and Treatment may vary due to age or medical  condition  Airway Management Planned: Natural Airway and Simple Face Mask  Additional Equipment:   Intra-op Plan:   Post-operative Plan:   Informed Consent: I have reviewed the patients History and Physical, chart, labs and discussed the procedure including the risks, benefits and alternatives for the proposed anesthesia with the patient or authorized representative who has indicated his/her understanding and acceptance.     Dental advisory given and Interpreter used for interveiw  Plan Discussed with: CRNA and Anesthesiologist  Anesthesia Plan Comments: (Discussed potential risks of nerve blocks including, but not limited to, infection, bleeding, nerve damage, seizures, pneumothorax, respiratory depression, and potential failure of the block. Alternatives to nerve blocks discussed. All questions answered.  I have discussed risks of neuraxial anesthesia including but not limited to infection, bleeding, nerve injury, back pain, headache, seizures, and failure of block. Patient denies bleeding disorders and is not currently anticoagulated. Labs have been reviewed. Risks and benefits discussed. All patient's questions answered.   Discussed with patient risks of MAC including, but not limited to, minor pain or discomfort, hearing people in the room, and possible need for backup general anesthesia. Risks for general anesthesia also discussed including, but not limited to, sore throat, hoarse voice, chipped/damaged teeth, injury to vocal cords, nausea and vomiting, allergic reactions, lung infection, heart attack, stroke, and death. All questions answered. )        Anesthesia Quick Evaluation

## 2023-06-07 ENCOUNTER — Other Ambulatory Visit: Payer: Self-pay

## 2023-06-07 ENCOUNTER — Encounter (HOSPITAL_COMMUNITY): Payer: Self-pay | Admitting: Orthopaedic Surgery

## 2023-06-07 ENCOUNTER — Other Ambulatory Visit: Payer: Self-pay | Admitting: Physician Assistant

## 2023-06-07 ENCOUNTER — Ambulatory Visit (HOSPITAL_COMMUNITY): Payer: Medicare (Managed Care) | Admitting: Anesthesiology

## 2023-06-07 ENCOUNTER — Observation Stay (HOSPITAL_COMMUNITY)
Admission: RE | Admit: 2023-06-07 | Discharge: 2023-06-08 | Disposition: A | Discharge status: Home / Self Care | Payer: Medicare (Managed Care) | Attending: Orthopaedic Surgery | Admitting: Orthopaedic Surgery

## 2023-06-07 ENCOUNTER — Encounter (HOSPITAL_COMMUNITY)
Admission: RE | Disposition: A | Discharge status: Home / Self Care | Payer: Self-pay | Source: Home / Self Care | Attending: Orthopaedic Surgery

## 2023-06-07 ENCOUNTER — Observation Stay (HOSPITAL_COMMUNITY): Payer: Medicare (Managed Care)

## 2023-06-07 DIAGNOSIS — M1712 Unilateral primary osteoarthritis, left knee: Secondary | ICD-10-CM

## 2023-06-07 DIAGNOSIS — Z96652 Presence of left artificial knee joint: Secondary | ICD-10-CM

## 2023-06-07 DIAGNOSIS — Z7982 Long term (current) use of aspirin: Secondary | ICD-10-CM | POA: Insufficient documentation

## 2023-06-07 DIAGNOSIS — I10 Essential (primary) hypertension: Secondary | ICD-10-CM | POA: Insufficient documentation

## 2023-06-07 DIAGNOSIS — Z96651 Presence of right artificial knee joint: Secondary | ICD-10-CM | POA: Insufficient documentation

## 2023-06-07 DIAGNOSIS — Z79899 Other long term (current) drug therapy: Secondary | ICD-10-CM | POA: Diagnosis not present

## 2023-06-07 HISTORY — PX: TOTAL KNEE ARTHROPLASTY: SHX125

## 2023-06-07 SURGERY — ARTHROPLASTY, KNEE, TOTAL
Anesthesia: Monitor Anesthesia Care | Site: Knee | Laterality: Left

## 2023-06-07 MED ORDER — PROMETHAZINE HCL 25 MG/ML IJ SOLN: 6.2500 mg | INTRAMUSCULAR | Status: DC | PRN

## 2023-06-07 MED ORDER — ONDANSETRON HCL 4 MG/2ML IJ SOLN
INTRAMUSCULAR | Status: DC | PRN
  Administered 2023-06-07: 4 mg via INTRAVENOUS

## 2023-06-07 MED ORDER — METOCLOPRAMIDE HCL 5 MG/ML IJ SOLN: 5.0000 mg | Freq: Three times a day (TID) | INTRAMUSCULAR | Status: DC | PRN

## 2023-06-07 MED ORDER — AMLODIPINE BESYLATE 5 MG PO TABS
5.0000 mg | ORAL_TABLET | Freq: Every day | ORAL | Status: DC
  Filled 2023-06-07: qty 1

## 2023-06-07 MED ORDER — DEXAMETHASONE SODIUM PHOSPHATE 10 MG/ML IJ SOLN
INTRAMUSCULAR | Status: AC
  Filled 2023-06-07: qty 2

## 2023-06-07 MED ORDER — DOCUSATE SODIUM 100 MG PO CAPS
100.0000 mg | ORAL_CAPSULE | Freq: Two times a day (BID) | ORAL | Status: DC
  Administered 2023-06-07 – 2023-06-08 (×3): 100 mg via ORAL
  Filled 2023-06-07 (×3): qty 1

## 2023-06-07 MED ORDER — CEFAZOLIN SODIUM-DEXTROSE 2-4 GM/100ML-% IV SOLN
2.0000 g | Freq: Four times a day (QID) | INTRAVENOUS | Status: AC
  Administered 2023-06-07 (×2): 2 g via INTRAVENOUS
  Filled 2023-06-07 (×2): qty 100

## 2023-06-07 MED ORDER — FENTANYL CITRATE (PF) 100 MCG/2ML IJ SOLN: 25.0000 ug | INTRAMUSCULAR | Status: DC | PRN

## 2023-06-07 MED ORDER — METOCLOPRAMIDE HCL 5 MG PO TABS: 5.0000 mg | ORAL_TABLET | Freq: Three times a day (TID) | ORAL | Status: DC | PRN

## 2023-06-07 MED ORDER — ONDANSETRON HCL 4 MG PO TABS: 4.0000 mg | ORAL_TABLET | Freq: Three times a day (TID) | ORAL | 0 refills | Status: AC | PRN

## 2023-06-07 MED ORDER — METHOCARBAMOL 500 MG PO TABS
500.0000 mg | ORAL_TABLET | Freq: Four times a day (QID) | ORAL | Status: DC | PRN
  Administered 2023-06-07 – 2023-06-08 (×2): 500 mg via ORAL
  Filled 2023-06-07 (×2): qty 1

## 2023-06-07 MED ORDER — OXYCODONE HCL ER 10 MG PO T12A
10.0000 mg | EXTENDED_RELEASE_TABLET | Freq: Two times a day (BID) | ORAL | Status: DC
  Administered 2023-06-07 – 2023-06-08 (×3): 10 mg via ORAL
  Filled 2023-06-07 (×3): qty 1

## 2023-06-07 MED ORDER — DEXAMETHASONE SODIUM PHOSPHATE 10 MG/ML IJ SOLN
10.0000 mg | Freq: Once | INTRAMUSCULAR | Status: AC
  Administered 2023-06-08: 10 mg via INTRAVENOUS
  Filled 2023-06-07: qty 1

## 2023-06-07 MED ORDER — SODIUM CHLORIDE 0.9 % IV SOLN: INTRAVENOUS | Status: DC

## 2023-06-07 MED ORDER — TRANEXAMIC ACID-NACL 1000-0.7 MG/100ML-% IV SOLN
1000.0000 mg | INTRAVENOUS | Status: AC
  Administered 2023-06-07: 1000 mg via INTRAVENOUS

## 2023-06-07 MED ORDER — ACETAMINOPHEN 500 MG PO TABS
1000.0000 mg | ORAL_TABLET | Freq: Four times a day (QID) | ORAL | Status: AC
  Administered 2023-06-07 – 2023-06-08 (×4): 1000 mg via ORAL
  Filled 2023-06-07 (×4): qty 2

## 2023-06-07 MED ORDER — METHOCARBAMOL 750 MG PO TABS: 750.0000 mg | ORAL_TABLET | Freq: Two times a day (BID) | ORAL | 2 refills | Status: AC | PRN

## 2023-06-07 MED ORDER — 0.9 % SODIUM CHLORIDE (POUR BTL) OPTIME
TOPICAL | Status: DC | PRN
  Administered 2023-06-07: 1000 mL

## 2023-06-07 MED ORDER — DOCUSATE SODIUM 100 MG PO CAPS: 100.0000 mg | ORAL_CAPSULE | Freq: Every day | ORAL | 2 refills | Status: AC | PRN

## 2023-06-07 MED ORDER — CHLORHEXIDINE GLUCONATE 0.12 % MT SOLN
OROMUCOSAL | Status: AC
  Administered 2023-06-07: 15 mL
  Filled 2023-06-07: qty 15

## 2023-06-07 MED ORDER — ONDANSETRON HCL 4 MG/2ML IJ SOLN: 4.0000 mg | Freq: Four times a day (QID) | INTRAMUSCULAR | Status: DC | PRN

## 2023-06-07 MED ORDER — BUPIVACAINE IN DEXTROSE 0.75-8.25 % IT SOLN
INTRATHECAL | Status: DC | PRN
Start: 2023-06-07 — End: 2023-06-07
  Administered 2023-06-07: 1.6 mL via INTRATHECAL

## 2023-06-07 MED ORDER — ACETAMINOPHEN 500 MG PO TABS
ORAL_TABLET | ORAL | Status: AC
  Administered 2023-06-07: 1000 mg via ORAL
  Filled 2023-06-07: qty 2

## 2023-06-07 MED ORDER — PROPOFOL 10 MG/ML IV BOLUS
INTRAVENOUS | Status: AC
  Filled 2023-06-07: qty 20

## 2023-06-07 MED ORDER — LOSARTAN POTASSIUM 50 MG PO TABS
50.0000 mg | ORAL_TABLET | Freq: Every day | ORAL | Status: DC
  Filled 2023-06-07: qty 1

## 2023-06-07 MED ORDER — ONDANSETRON HCL 4 MG PO TABS: 4.0000 mg | ORAL_TABLET | Freq: Four times a day (QID) | ORAL | Status: DC | PRN

## 2023-06-07 MED ORDER — TRANEXAMIC ACID-NACL 1000-0.7 MG/100ML-% IV SOLN
INTRAVENOUS | Status: AC
  Filled 2023-06-07: qty 100

## 2023-06-07 MED ORDER — GLYCOPYRROLATE 0.2 MG/ML IJ SOLN
INTRAMUSCULAR | Status: DC | PRN
  Administered 2023-06-07: .2 mg via INTRAVENOUS

## 2023-06-07 MED ORDER — ASPIRIN 81 MG PO TBEC: 81.0000 mg | DELAYED_RELEASE_TABLET | Freq: Two times a day (BID) | ORAL | 0 refills | Status: AC

## 2023-06-07 MED ORDER — BUPIVACAINE-MELOXICAM ER 400-12 MG/14ML IJ SOLN
INTRAMUSCULAR | Status: AC
  Filled 2023-06-07: qty 1

## 2023-06-07 MED ORDER — MENTHOL 3 MG MT LOZG: 1.0000 | LOZENGE | OROMUCOSAL | Status: DC | PRN

## 2023-06-07 MED ORDER — FENTANYL CITRATE (PF) 250 MCG/5ML IJ SOLN
INTRAMUSCULAR | Status: DC | PRN
  Administered 2023-06-07 (×3): 50 ug via INTRAVENOUS
  Administered 2023-06-07 (×2): 25 ug via INTRAVENOUS
  Administered 2023-06-07: 50 ug via INTRAVENOUS

## 2023-06-07 MED ORDER — PHENYLEPHRINE 80 MCG/ML (10ML) SYRINGE FOR IV PUSH (FOR BLOOD PRESSURE SUPPORT)
PREFILLED_SYRINGE | INTRAVENOUS | Status: DC | PRN
  Administered 2023-06-07 (×3): 80 ug via INTRAVENOUS

## 2023-06-07 MED ORDER — LACTATED RINGERS IV SOLN: INTRAVENOUS | Status: DC

## 2023-06-07 MED ORDER — VANCOMYCIN HCL 1000 MG IV SOLR
INTRAVENOUS | Status: AC
  Filled 2023-06-07: qty 20

## 2023-06-07 MED ORDER — PROPOFOL 10 MG/ML IV BOLUS
INTRAVENOUS | Status: DC | PRN
  Administered 2023-06-07: 20 mg via INTRAVENOUS
  Administered 2023-06-07: 30 mg via INTRAVENOUS
  Administered 2023-06-07: 20 mg via INTRAVENOUS
  Administered 2023-06-07: 30 mg via INTRAVENOUS
  Administered 2023-06-07: 20 mg via INTRAVENOUS
  Administered 2023-06-07 (×2): 10 mg via INTRAVENOUS

## 2023-06-07 MED ORDER — SODIUM CHLORIDE 0.9 % IR SOLN
Status: DC | PRN
  Administered 2023-06-07: 1000 mL

## 2023-06-07 MED ORDER — TRANEXAMIC ACID-NACL 1000-0.7 MG/100ML-% IV SOLN
1000.0000 mg | Freq: Once | INTRAVENOUS | Status: AC
  Administered 2023-06-07: 1000 mg via INTRAVENOUS
  Filled 2023-06-07: qty 100

## 2023-06-07 MED ORDER — ONDANSETRON HCL 4 MG/2ML IJ SOLN
INTRAMUSCULAR | Status: AC
  Filled 2023-06-07: qty 4

## 2023-06-07 MED ORDER — PHENOL 1.4 % MT LIQD: 1.0000 | OROMUCOSAL | Status: DC | PRN

## 2023-06-07 MED ORDER — BUPIVACAINE-MELOXICAM ER 400-12 MG/14ML IJ SOLN
INTRAMUSCULAR | Status: DC | PRN
  Administered 2023-06-07: 400 mg

## 2023-06-07 MED ORDER — TRANEXAMIC ACID 1000 MG/10ML IV SOLN
INTRAVENOUS | Status: DC | PRN
  Administered 2023-06-07: 2000 mg via TOPICAL

## 2023-06-07 MED ORDER — ACETAMINOPHEN 325 MG PO TABS: 325.0000 mg | ORAL_TABLET | Freq: Four times a day (QID) | ORAL | Status: DC | PRN

## 2023-06-07 MED ORDER — ACETAMINOPHEN 500 MG PO TABS: 1000.0000 mg | ORAL_TABLET | Freq: Once | ORAL | Status: AC

## 2023-06-07 MED ORDER — PROPOFOL 500 MG/50ML IV EMUL
INTRAVENOUS | Status: DC | PRN
  Administered 2023-06-07: 100 ug/kg/min via INTRAVENOUS

## 2023-06-07 MED ORDER — METHOCARBAMOL 1000 MG/10ML IJ SOLN: 500.0000 mg | Freq: Four times a day (QID) | INTRAVENOUS | Status: DC | PRN

## 2023-06-07 MED ORDER — CEFAZOLIN SODIUM-DEXTROSE 2-4 GM/100ML-% IV SOLN
2.0000 g | INTRAVENOUS | Status: AC
  Administered 2023-06-07: 2 g via INTRAVENOUS

## 2023-06-07 MED ORDER — ROPIVACAINE HCL 5 MG/ML IJ SOLN
INTRAMUSCULAR | Status: DC | PRN
Start: 2023-06-07 — End: 2023-06-07
  Administered 2023-06-07: 20 mL via PERINEURAL

## 2023-06-07 MED ORDER — OXYCODONE HCL 5 MG PO TABS
10.0000 mg | ORAL_TABLET | ORAL | Status: DC | PRN
  Administered 2023-06-08: 10 mg via ORAL
  Filled 2023-06-07: qty 2

## 2023-06-07 MED ORDER — OXYCODONE-ACETAMINOPHEN 5-325 MG PO TABS: 1.0000 | ORAL_TABLET | Freq: Four times a day (QID) | ORAL | 0 refills | Status: DC | PRN

## 2023-06-07 MED ORDER — POVIDONE-IODINE 10 % EX SWAB
2.0000 | Freq: Once | CUTANEOUS | Status: AC
  Administered 2023-06-07: 2 via TOPICAL

## 2023-06-07 MED ORDER — FENTANYL CITRATE (PF) 250 MCG/5ML IJ SOLN
INTRAMUSCULAR | Status: AC
  Filled 2023-06-07: qty 5

## 2023-06-07 MED ORDER — ASPIRIN 81 MG PO CHEW
81.0000 mg | CHEWABLE_TABLET | Freq: Two times a day (BID) | ORAL | Status: DC
  Administered 2023-06-07 – 2023-06-08 (×2): 81 mg via ORAL
  Filled 2023-06-07 (×2): qty 1

## 2023-06-07 MED ORDER — ONDANSETRON HCL 4 MG/2ML IJ SOLN
INTRAMUSCULAR | Status: AC
  Filled 2023-06-07: qty 2

## 2023-06-07 MED ORDER — FERROUS SULFATE 325 (65 FE) MG PO TABS
325.0000 mg | ORAL_TABLET | Freq: Three times a day (TID) | ORAL | Status: DC
  Administered 2023-06-07 – 2023-06-08 (×3): 325 mg via ORAL
  Filled 2023-06-07 (×3): qty 1

## 2023-06-07 MED ORDER — EPHEDRINE SULFATE-NACL 50-0.9 MG/10ML-% IV SOSY
PREFILLED_SYRINGE | INTRAVENOUS | Status: DC | PRN
  Administered 2023-06-07: 5 mg via INTRAVENOUS

## 2023-06-07 MED ORDER — HYDROMORPHONE HCL 1 MG/ML IJ SOLN: 0.5000 mg | INTRAMUSCULAR | Status: DC | PRN

## 2023-06-07 MED ORDER — OXYCODONE HCL 5 MG PO TABS
5.0000 mg | ORAL_TABLET | ORAL | Status: DC | PRN
  Administered 2023-06-07: 10 mg via ORAL
  Administered 2023-06-08: 5 mg via ORAL
  Filled 2023-06-07: qty 2
  Filled 2023-06-07: qty 1

## 2023-06-07 MED ORDER — PRONTOSAN WOUND IRRIGATION OPTIME
TOPICAL | Status: DC | PRN
  Administered 2023-06-07: 350 mL

## 2023-06-07 MED ORDER — VANCOMYCIN HCL 1000 MG IV SOLR
INTRAVENOUS | Status: DC | PRN
  Administered 2023-06-07: 1000 mg

## 2023-06-07 MED ORDER — DEXAMETHASONE SODIUM PHOSPHATE 10 MG/ML IJ SOLN
INTRAMUSCULAR | Status: AC
  Filled 2023-06-07: qty 1

## 2023-06-07 MED ORDER — DEXAMETHASONE SODIUM PHOSPHATE 10 MG/ML IJ SOLN
INTRAMUSCULAR | Status: DC | PRN
  Administered 2023-06-07: 10 mg via INTRAVENOUS

## 2023-06-07 MED ORDER — LIDOCAINE 2% (20 MG/ML) 5 ML SYRINGE
INTRAMUSCULAR | Status: AC
  Filled 2023-06-07: qty 5

## 2023-06-07 MED ORDER — CEFAZOLIN SODIUM-DEXTROSE 2-4 GM/100ML-% IV SOLN
INTRAVENOUS | Status: AC
  Filled 2023-06-07: qty 100

## 2023-06-07 SURGICAL SUPPLY — 88 items
ADH SKN CLS APL DERMABOND .7 (GAUZE/BANDAGES/DRESSINGS) ×1
ALCOHOL 70% 16 OZ (MISCELLANEOUS) ×1 IMPLANT
BAG COUNTER SPONGE SURGICOUNT (BAG) IMPLANT
BAG DECANTER FOR FLEXI CONT (MISCELLANEOUS) ×1 IMPLANT
BAG SPNG CNTER NS LX DISP (BAG)
BANDAGE ESMARK 6X9 LF (GAUZE/BANDAGES/DRESSINGS) IMPLANT
BIT DRILL QUICK REL 1/8 2PK SL (BIT) IMPLANT
BLADE SAG 18X100X1.27 (BLADE) ×1 IMPLANT
BLADE SAW SAG 90X13X1.27 (BLADE) IMPLANT
BLADE SAW SGTL 73X25 THK (BLADE) ×1 IMPLANT
BNDG CMPR 9X6 STRL LF SNTH (GAUZE/BANDAGES/DRESSINGS)
BNDG ESMARK 6X9 LF (GAUZE/BANDAGES/DRESSINGS)
BOWL SMART MIX CTS (DISPOSABLE) ×1 IMPLANT
BSPLAT TIB 5D D CMNT STM LT (Knees) ×1 IMPLANT
CEMENT BONE REFOBACIN R1X40 US (Cement) IMPLANT
CLSR STERI-STRIP ANTIMIC 1/2X4 (GAUZE/BANDAGES/DRESSINGS) ×2 IMPLANT
COMP FEM CMT PS N 4 LT (Joint) ×1 IMPLANT
COMP PATELLAR 29 STD 8 THK (Orthopedic Implant) IMPLANT
COMPONENT FEM CMT PS N 4 LT (Joint) IMPLANT
COOLER ICEMAN CLASSIC (MISCELLANEOUS) ×1 IMPLANT
COVER SURGICAL LIGHT HANDLE (MISCELLANEOUS) ×1 IMPLANT
CUFF TOURN SGL QUICK 34 (TOURNIQUET CUFF) ×1
CUFF TOURN SGL QUICK 42 (TOURNIQUET CUFF) IMPLANT
CUFF TRNQT CYL 34X4.125X (TOURNIQUET CUFF) ×1 IMPLANT
DERMABOND ADVANCED .7 DNX12 (GAUZE/BANDAGES/DRESSINGS) ×1 IMPLANT
DRAPE EXTREMITY T 121X128X90 (DISPOSABLE) ×1 IMPLANT
DRAPE HALF SHEET 40X57 (DRAPES) ×1 IMPLANT
DRAPE INCISE IOBAN 66X45 STRL (DRAPES) ×1 IMPLANT
DRAPE ORTHO SPLIT 77X108 STRL (DRAPES)
DRAPE POUCH INSTRU U-SHP 10X18 (DRAPES) ×1 IMPLANT
DRAPE SURG ORHT 6 SPLT 77X108 (DRAPES) IMPLANT
DRAPE U-SHAPE 47X51 STRL (DRAPES) ×2 IMPLANT
DRSG AQUACEL AG ADV 3.5X10 (GAUZE/BANDAGES/DRESSINGS) ×1 IMPLANT
DURAPREP 26ML APPLICATOR (WOUND CARE) ×3 IMPLANT
ELECT CAUTERY BLADE 6.4 (BLADE) ×1 IMPLANT
ELECT PENCIL ROCKER SW 15FT (MISCELLANEOUS) ×1 IMPLANT
ELECT REM PT RETURN 9FT ADLT (ELECTROSURGICAL) ×1
ELECTRODE REM PT RTRN 9FT ADLT (ELECTROSURGICAL) ×1 IMPLANT
GLOVE BIOGEL PI IND STRL 7.0 (GLOVE) ×2 IMPLANT
GLOVE BIOGEL PI IND STRL 7.5 (GLOVE) ×5 IMPLANT
GLOVE ECLIPSE 7.0 STRL STRAW (GLOVE) ×3 IMPLANT
GLOVE INDICATOR 7.0 STRL GRN (GLOVE) ×1 IMPLANT
GLOVE INDICATOR 7.5 STRL GRN (GLOVE) ×1 IMPLANT
GLOVE SURG SYN 7.5 E (GLOVE) ×2 IMPLANT
GLOVE SURG SYN 7.5 PF PI (GLOVE) ×2 IMPLANT
GLOVE SURG UNDER LTX SZ7.5 (GLOVE) ×2 IMPLANT
GLOVE SURG UNDER POLY LF SZ7 (GLOVE) ×2 IMPLANT
GOWN STRL REUS W/ TWL LRG LVL3 (GOWN DISPOSABLE) ×1 IMPLANT
GOWN STRL REUS W/TWL LRG LVL3 (GOWN DISPOSABLE) ×1
GOWN STRL SURGICAL XL XLNG (GOWN DISPOSABLE) ×1 IMPLANT
GOWN TOGA ZIPPER T7+ PEEL AWAY (MISCELLANEOUS) ×2 IMPLANT
HANDPIECE INTERPULSE COAX TIP (DISPOSABLE) ×1
HOOD PEEL AWAY T7 (MISCELLANEOUS) ×1 IMPLANT
INSERT COMP PS 16 4-5/CD LT (Insert) IMPLANT
KIT BASIN OR (CUSTOM PROCEDURE TRAY) ×1 IMPLANT
KIT TURNOVER KIT B (KITS) ×1 IMPLANT
MANIFOLD NEPTUNE II (INSTRUMENTS) ×1 IMPLANT
MARKER SKIN DUAL TIP RULER LAB (MISCELLANEOUS) ×2 IMPLANT
NDL SPNL 18GX3.5 QUINCKE PK (NEEDLE) ×1 IMPLANT
NEEDLE SPNL 18GX3.5 QUINCKE PK (NEEDLE) ×1 IMPLANT
NS IRRIG 1000ML POUR BTL (IV SOLUTION) ×1 IMPLANT
PACK TOTAL JOINT (CUSTOM PROCEDURE TRAY) ×1 IMPLANT
PAD ARMBOARD 7.5X6 YLW CONV (MISCELLANEOUS) ×2 IMPLANT
PATELLA ZIMMER 29MM (Orthopedic Implant) ×1 IMPLANT
PIN DRILL HDLS TROCAR 75 4PK (PIN) IMPLANT
SCREW FEMALE HEX FIX 25X2.5 (ORTHOPEDIC DISPOSABLE SUPPLIES) IMPLANT
SET HNDPC FAN SPRY TIP SCT (DISPOSABLE) ×1 IMPLANT
SOLUTION PRONTOSAN WOUND 350ML (IRRIGATION / IRRIGATOR) ×1 IMPLANT
STAPLER VISISTAT 35W (STAPLE) IMPLANT
STEM TIBIA 5 DEG SZ D L KNEE (Knees) IMPLANT
SUCTION TUBE FRAZIER 10FR DISP (SUCTIONS) ×1 IMPLANT
SUT ETHILON 2 0 FS 18 (SUTURE) IMPLANT
SUT ETHILON 2 0 PSLX (SUTURE) IMPLANT
SUT MNCRL AB 3-0 PS2 27 (SUTURE) IMPLANT
SUT VIC AB 0 CT1 27 (SUTURE) ×1
SUT VIC AB 0 CT1 27XBRD ANBCTR (SUTURE) ×2 IMPLANT
SUT VIC AB 0 CT1 36 (SUTURE) IMPLANT
SUT VIC AB 1 CTX 27 (SUTURE) ×3 IMPLANT
SUT VIC AB 2-0 CT1 27 (SUTURE) ×5
SUT VIC AB 2-0 CT1 TAPERPNT 27 (SUTURE) ×4 IMPLANT
SYR 50ML LL SCALE MARK (SYRINGE) ×2 IMPLANT
TIBIA STEM 5 DEG SZ D L KNEE (Knees) ×1 IMPLANT
TOWEL GREEN STERILE (TOWEL DISPOSABLE) ×1 IMPLANT
TOWEL GREEN STERILE FF (TOWEL DISPOSABLE) ×1 IMPLANT
TRAY CATH INTERMITTENT SS 16FR (CATHETERS) IMPLANT
TUBE SUCT ARGYLE STRL (TUBING) ×1 IMPLANT
UNDERPAD 30X36 HEAVY ABSORB (UNDERPADS AND DIAPERS) ×1 IMPLANT
YANKAUER SUCT BULB TIP NO VENT (SUCTIONS) ×2 IMPLANT

## 2023-06-07 NOTE — Op Note (Signed)
Total Knee Arthroplasty Procedure Note  Preoperative diagnosis: Left knee osteoarthritis  Postoperative diagnosis:same  Operative findings: Complete loss joint space from all 3 compartments Varus deformity  Operative procedure: Left total knee arthroplasty. CPT [phone]  Surgeon: N. Glee Arvin, MD  Assist: Oneal Grout, PA-C; necessary for the timely completion of procedure and due to complexity of procedure.  Anesthesia: Spinal, regional, local  Tourniquet time: see anesthesia record  Implants used: Zimmer persona Femur: CR 4 narrow Tibia: D Patella: 29 mm Polyethylene: 16 mm medial congruent  Indication: Katie Burns is a 77 y.o. year old female with a history of knee pain. Having failed conservative management, the patient elected to proceed with a total knee arthroplasty.  We have reviewed the risk and benefits of the surgery and they elected to proceed after voicing understanding.  Procedure:  After informed consent was obtained and understanding of the risk were voiced including but not limited to bleeding, infection, damage to surrounding structures including nerves and vessels, blood clots, leg length inequality and the failure to achieve desired results, the operative extremity was marked with verbal confirmation of the patient in the holding area.   The patient was then brought to the operating room and transported to the operating room table in the supine position.  A tourniquet was applied to the operative extremity around the upper thigh. The operative limb was then prepped and draped in the usual sterile fashion and preoperative antibiotics were administered.  A time out was performed prior to the start of surgery confirming the correct extremity, preoperative antibiotic administration, as well as team members, implants and instruments available for the case. Correct surgical site was also confirmed with preoperative radiographs. The limb was then elevated for  exsanguination and the tourniquet was inflated. A midline incision was made and a standard medial parapatellar approach was performed.  The infrapatellar fat pad was removed.  Suprapatellar synovium was removed to reveal the anterior distal femoral cortex.  A medial peel was performed to release the capsule and the deep MCL off of the medial tibial plateau.  The patella was then everted which showed complete loss of articular cartilage and was prepared and sized to a 29 mm.  A cover was placed on the patella for protection from retractors.  The knee was then brought into full flexion and we then turned our attention to the femur.  The cruciates were sacrificed.  Start site was drilled in the femur and the intramedullary distal femoral cutting guide was placed, set at 3 degrees valgus, taking 10 mm of distal resection. The distal cut was made. Osteophytes were then removed.  Next, the proximal tibial cutting guide was placed with appropriate slope, varus/valgus alignment and depth of resection.  The drop rod was attached to confirm that it was aimed at the second metatarsal.  The proximal tibial cut was made taking 2 mm off the low side. Gap blocks were then used to assess the extension gap and alignment, and appropriate soft tissue releases were performed. Attention was turned back to the femur, which was sized using the sizing guide to a size 4 narrow. Appropriate rotation of the femoral component was determined using epicondylar axis, Whiteside's line, and assessing the flexion gap under ligament tension. The appropriate size 4-in-1 cutting block was placed and checked with an angel wing and cuts were made. Posterior femoral osteophytes and uncapped bone were then removed with the curved osteotome.  The menisci were removed.  Trial components were placed, and  stability was checked in full extension, mid-flexion, and deep flexion. Proper tibial rotation was determined and marked.  The patella tracked well without  a lateral release.  The femoral lugs were then drilled. Trial components were then removed and tibial preparation performed.  The tibia was sized for a size D component.   The bony surfaces were irrigated with a pulse lavage and then dried. Bone cement was vacuum mixed on the back table, and the final components sized above were cemented into place.  Antibiotic irrigation was placed in the knee joint and soft tissues while the cement cured.  After cement had finished curing, excess cement was removed. The stability of the construct was re-evaluated throughout a range of motion and found to be acceptable. The trial liner was removed, the knee was copiously irrigated, and the knee was re-evaluated for any excess bone debris. The real polyethylene liner, 16 mm thick, was inserted and checked to ensure the locking mechanism had engaged appropriately. The tourniquet was deflated and hemostasis was achieved. The wound was irrigated with normal saline.  One gram of vancomycin powder was placed in the surgical bed.  Topical 0.25% bupivacaine and meloxicam was placed in the joint for postoperative pain.  Capsular closure was performed with a #1 vicryl, subcutaneous fat closed with a 0 vicryl suture, then subcutaneous tissue closed with interrupted 2.0 vicryl suture. The skin was then closed with a 2.0 nylon and dermabond. A sterile dressing was applied.  The patient was awakened in the operating room and taken to recovery in stable condition. All sponge, needle, and instrument counts were correct at the end of the case.  Katie Burns was necessary for opening, closing, retracting, limb positioning and overall facilitation and completion of the surgery.  Position: supine  Complications: none.  Time Out: performed   Drains/Packing: none  Estimated blood loss: minimal  Returned to Recovery Room: in good condition.   Antibiotics: yes   Mechanical VTE (DVT) Prophylaxis: sequential compression devices, TED  thigh-high  Chemical VTE (DVT) Prophylaxis: aspirin  Fluid Replacement  Crystalloid: see anesthesia record Blood: none  FFP: none   Specimens Removed: 1 to pathology   Sponge and Instrument Count Correct? yes   PACU: portable radiograph - knee AP and Lateral   Plan/RTC: Return in 2 weeks for wound check.   Weight Bearing/Load Lower Extremity: full   Implant Name Type Inv. Item Serial No. Manufacturer Lot No. LRB No. Used Action  CEMENT BONE REFOBACIN [id number] [place] - [id number] Cement CEMENT BONE REFOBACIN [id number] [place]  ZIMMER RECON(ORTH,TRAU,BIO,SG) [id number] Left 2 Implanted  BSPLAT TIB 5D D CMNT STM LT - [id number] Knees BSPLAT TIB 5D D CMNT STM LT  ZIMMER RECON(ORTH,TRAU,BIO,SG) [id number] Left 1 Implanted  COMP FEM CMT PS N 4 LT - [id number] Joint COMP FEM CMT PS N 4 LT  ZIMMER RECON(ORTH,TRAU,BIO,SG) [id number] Left 1 Implanted  INSERT COMP PS 16 4-5/CD LT - [id number] Insert INSERT COMP PS 16 4-5/CD LT  ZIMMER RECON(ORTH,TRAU,BIO,SG) [id number] Left 1 Implanted  NexGen Complete knee solution all poly patella standard cemented size 29 8.0 mm thickness    ZIMMER [id number] Left 1 Implanted    N. Glee Arvin, MD [facility] 8:53 AM

## 2023-06-07 NOTE — Discharge Instructions (Signed)

## 2023-06-07 NOTE — TOC Initial Note (Signed)
Transition of Care [facility]) - Initial/Assessment Note    Patient Details  Name: Katie Burns MRN: [id number] Date of Birth: Aug 23, 1946  [facility] CM/SW Contact:    Kermit Balo, RN Phone Number: 06/07/2023, 3:36 PM  Clinical Narrative:                 CM met with the patient and her daughter at the bedside. Daughter states they have used [facility] in the past and asked to use them again. [facility] accepted and information is on the AVS.  Any needed DME the bedside RN will obtain. Daughter will provide transportation home.    Barriers to Discharge: Continued Medical Work up   Patient Goals and CMS Choice   CMS Medicare.gov Compare Post Acute Care list provided to:: Patient Represenative (must comment) Choice offered to / list presented to : Adult Children      Expected Discharge Plan and Services   Discharge Planning Services: CM Consult Post Acute Care Choice: Home Health Living arrangements for the past 2 months: Single Family Home                           HH Arranged: PT, OT HH Agency: [facility] Date [facility] Agency Contacted: 06/07/23   Representative spoke with at [facility] Agency: Haywood Lasso  Prior Living Arrangements/Services Living arrangements for the past 2 months: Single Family Home Lives with:: Adult Children Patient language and need for interpreter reviewed:: Yes Do you feel safe going back to the place where you live?: Yes        Care giver support system in place?: Yes (comment)   Criminal Activity/Legal Involvement Pertinent to Current Situation/Hospitalization: No - Comment as needed  Activities of Daily Living      Permission Sought/Granted                  Emotional Assessment Appearance:: Appears stated age         Psych Involvement: No (comment)  Admission diagnosis:  Status post total left knee replacement [Z96.652] Patient Active Problem List   Diagnosis Date Noted   Status post total left knee  replacement 06/07/2023   Primary osteoarthritis of left knee 01/19/2023   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 [place] [address] Phone: [phone] Fax: [phone]     Social Determinants of Health (SDOH) Social History: SDOH Screenings   Food Insecurity: No Food Insecurity (10/26/2022)  Housing: Low Risk  (10/26/2022)  Transportation Needs: No Transportation Needs (10/26/2022)  Utilities: Not At Risk (10/26/2022)  Depression (PHQ2-9): Low Risk  (02/04/2023)  Tobacco Use: Low Risk  (06/07/2023)   SDOH Interventions:     Readmission Risk Interventions     No data to display

## 2023-06-07 NOTE — Evaluation (Signed)
Physical Therapy Evaluation Patient Details Name: Katie Burns MRN: [id number] DOB: [birth year] Today's Date: 06/07/2023  History of Present Illness  77 y.o. female presents to [facility] hospital on 06/07/2023 for elective L TKA. PMH includes cataract extraction, HTN, HLD, prediabetes.77 y.o. female presents to [facility] hospital on 06/07/2023 for elective L TKA. PMH includes cataract extraction, HTN, HLD, prediabetes.  Clinical Impression  Pt presents to PT with deficits in functional mobility, gait, balance, safety awareness. Pt is mobilizing well s/p TKA, ambulating for short household distances with support of RW. Pt requires education on need for use of DME when mobilizing in an effort to reduce falls risk, as she often impulsively stands without DME present. PT will follow up tomorrow for further gait and stair training.        If plan is discharge home, recommend the following: A little help with walking and/or transfers;A little help with bathing/dressing/bathroom;Assistance with cooking/housework;Assist for transportation;Help with stairs or ramp for entrance   Can travel by private vehicle        Equipment Recommendations None recommended by PT  Recommendations for Other Services       Functional Status Assessment Patient has had a recent decline in their functional status and demonstrates the ability to make significant improvements in function in a reasonable and predictable amount of time.     Precautions / Restrictions Precautions Precautions: Fall;Knee Precaution Booklet Issued: Yes (comment) Restrictions Weight Bearing Restrictions: Yes LLE Weight Bearing: Weight bearing as tolerated      Mobility  Bed Mobility Overal bed mobility: Needs Assistance Bed Mobility: Supine to Sit, Sit to Supine     Supine to sit: Contact guard Sit to supine: Contact guard assist        Transfers Overall transfer level: Needs assistance Equipment used: Rolling walker (2 wheels) Transfers: Sit  to/from Stand Sit to Stand: Contact guard assist           General transfer comment: verbal cues to utilize RW as pt initially attempting to impulsively stand without DME    Ambulation/Gait Ambulation/Gait assistance: Contact guard assist Gait Distance (Feet): 80 Feet Assistive device: Rolling walker (2 wheels) Gait Pattern/deviations: Step-to pattern Gait velocity: reduced Gait velocity interpretation: <1.8 ft/sec, indicate of risk for recurrent falls   General Gait Details: slowed step-to gait, verbal cues to increase step-length and to maintain for increased distance from walker to allow for increased stride  Stairs            Wheelchair Mobility     Tilt Bed    Modified Rankin (Stroke Patients Only)       Balance Overall balance assessment: Needs assistance Sitting-balance support: No upper extremity supported, Feet supported Sitting balance-Leahy Scale: Good     Standing balance support: Single extremity supported, Reliant on assistive device for balance Standing balance-Leahy Scale: Poor                               Pertinent Vitals/Pain Pain Assessment Pain Assessment: 0-10 Pain Score: 8  Pain Location: L knee Pain Descriptors / Indicators: Sore Pain Intervention(s): Monitored during session    Home Living Family/patient expects to be discharged to:: Private residence Living Arrangements: Children;Spouse/significant other Available Help at Discharge: Family;Available 24 hours/day Type of Home: House Home Access: Stairs to enter Entrance Stairs-Rails: Right;Left Entrance Stairs-Number of Steps: 2 Alternate Level Stairs-Number of Steps: 6-landing-6 Home Layout: Two level Home Equipment: Agricultural consultant (2 wheels);Cane - single point;BSC/3in1;Grab  bars - tub/shower;Hand held shower head      Prior Function Prior Level of Function : Independent/Modified Independent             Mobility Comments: ambulating with SPC  recently ADLs Comments: pt is independent with ADLs at baseline, has been only assisting with IADLs since last knee surgery     Extremity/Trunk Assessment   Upper Extremity Assessment Upper Extremity Assessment: Overall WFL for tasks assessed    Lower Extremity Assessment Lower Extremity Assessment: LLE deficits/detail LLE Deficits / Details: post-op strength and ROM deficits as expected s/p TKA, able to perform SLR    Cervical / Trunk Assessment Cervical / Trunk Assessment: Normal  Communication   Communication Communication: No apparent difficulties (pt prefers for daughter to interpret during session) Cueing Techniques: Verbal cues  Cognition Arousal: Alert Behavior During Therapy: WFL for tasks assessed/performed Overall Cognitive Status: Within Functional Limits for tasks assessed                                          General Comments General comments (skin integrity, edema, etc.): VSS on RA    Exercises Other Exercises Other Exercises: PT provides TKA exercise handout, pt declines education on packet and expresses good knowledge of exercises from recent TKA   Assessment/Plan    PT Assessment Patient needs continued PT services  PT Problem List Decreased strength;Decreased activity tolerance;Decreased balance;Decreased mobility;Decreased knowledge of use of DME;Decreased safety awareness;Decreased knowledge of precautions;Pain       PT Treatment Interventions DME instruction;Gait training;Stair training;Functional mobility training;Therapeutic activities;Balance training;Neuromuscular re-education;Patient/family education    PT Goals (Current goals can be found in the Care Plan section)  Acute Rehab PT Goals Patient Stated Goal: to go home PT Goal Formulation: With patient Time For Goal Achievement: 06/11/23 Potential to Achieve Goals: Good    Frequency Min 1X/week     Co-evaluation               AM-PAC PT "6 Clicks" Mobility   Outcome Measure Help needed turning from your back to your side while in a flat bed without using bedrails?: A Little Help needed moving from lying on your back to sitting on the side of a flat bed without using bedrails?: A Little Help needed moving to and from a bed to a chair (including a wheelchair)?: A Little Help needed standing up from a chair using your arms (e.g., wheelchair or bedside chair)?: A Little Help needed to walk in hospital room?: A Little Help needed climbing 3-5 steps with a railing? : Total 6 Click Score: 16    End of Session   Activity Tolerance: Patient tolerated treatment well Patient left: in bed;with call bell/phone within reach;with family/visitor present Nurse Communication: Mobility status PT Visit Diagnosis: Other abnormalities of gait and mobility (R26.89);Muscle weakness (generalized) (M62.81)    Time: 1610-9604 PT Time Calculation (min) (ACUTE ONLY): 26 min   Charges:   PT Evaluation $PT Eval Low Complexity: 1 Low   PT General Charges $$ ACUTE PT VISIT: 1 Visit         Arlyss Gandy, PT, DPT Acute Rehabilitation Office [phone]   Arlyss Gandy 06/07/2023, 1:54 PM

## 2023-06-07 NOTE — Transfer of Care (Signed)
Immediate Anesthesia Transfer of Care Note  Patient: Katie Burns  Procedure(s) Performed: LEFT TOTAL KNEE ARTHROPLASTY (Left: Knee)  Patient Location: PACU  Anesthesia Type:MAC, Regional, and Spinal  Level of Consciousness: awake  Airway & Oxygen Therapy: Patient Spontanous Breathing  Post-op Assessment: Report given to RN and Post -op Vital signs reviewed and stable  Post vital signs: Reviewed  Last Vitals:  Vitals Value Taken Time  BP    Temp    Pulse 78 06/07/23 0924  Resp 15 06/07/23 0924  SpO2 98 % 06/07/23 0924  Vitals shown include unfiled device data.  Last Pain:  Vitals:   06/07/23 0705  TempSrc:   PainSc: 0-No pain         Complications: No notable events documented.

## 2023-06-07 NOTE — Anesthesia Procedure Notes (Signed)
Anesthesia Regional Block: Adductor canal block   Pre-Anesthetic Checklist: , timeout performed,  Correct Patient, Correct Site, Correct Laterality,  Correct Procedure, Correct Position, site marked,  Risks and benefits discussed,  Surgical consent,  Pre-op evaluation,  At surgeon's request and post-op pain management  Laterality: Left  Prep: chloraprep       Needles:  Injection technique: Single-shot  Needle Type: Echogenic Stimulator Needle     Needle Length: 9cm  Needle Gauge: 21     Additional Needles:   Procedures:,,,, ultrasound used (permanent image in chart),,    Narrative:  Start time: 06/07/2023 6:58 AM End time: 06/07/2023 7:01 AM Injection made incrementally with aspirations every 5 mL.  Performed by: Personally  Anesthesiologist: Linton Rump, MD  Additional Notes: Discussed risks and benefits of nerve block including, but not limited to, prolonged and/or permanent nerve injury involving sensory and/or motor function. Monitors were applied and a time-out was performed. The nerve and associated structures were visualized under ultrasound guidance. After negative aspiration, local anesthetic was slowly injected around the nerve. There was no evidence of high pressure during the procedure. There were no paresthesias. VSS remained stable and the patient tolerated the procedure well.

## 2023-06-07 NOTE — Anesthesia Procedure Notes (Signed)
Spinal  Patient location during procedure: OR Start time: 06/07/2023 7:30 AM End time: 06/07/2023 7:32 AM Reason for block: surgical anesthesia Staffing Performed: anesthesiologist  Anesthesiologist: Linton Rump, MD Performed by: Linton Rump, MD Authorized by: Linton Rump, MD   Preanesthetic Checklist Completed: patient identified, IV checked, site marked, risks and benefits discussed, surgical consent, monitors and equipment checked, pre-op evaluation and timeout performed Spinal Block Patient position: sitting Prep: DuraPrep Patient monitoring: blood pressure and continuous pulse ox Approach: midline Location: L3-4 Injection technique: single-shot Needle Needle type: Pencan  Needle gauge: 24 G Needle length: 9 cm Additional Notes Risks and benefits of neuraxial anesthesia including, but not limited to, infection, bleeding, local anesthetic toxicity, headache, hypotension, back pain, block failure, etc. were discussed with the patient. The patient expressed understanding and consented to the procedure. I confirmed that the patient has no bleeding disorders and is not taking blood thinners. I confirmed the patient's last platelet count with the nurse. Monitors were applied. A time-out was performed immediately prior to the procedure. Sterile technique was used throughout the whole procedure.   1 attempt(s)

## 2023-06-07 NOTE — Anesthesia Postprocedure Evaluation (Signed)
Anesthesia Post Note  Patient: Katie Burns  Procedure(s) Performed: LEFT TOTAL KNEE ARTHROPLASTY (Left: Knee)     Patient location during evaluation: PACU Anesthesia Type: Regional, Spinal and MAC Level of consciousness: awake Pain management: pain level controlled Vital Signs Assessment: post-procedure vital signs reviewed and stable Respiratory status: spontaneous breathing, respiratory function stable and nonlabored ventilation Cardiovascular status: blood pressure returned to baseline and stable Postop Assessment: no headache, no backache and no apparent nausea or vomiting Anesthetic complications: no   No notable events documented.  Last Vitals:  Vitals:   06/07/23 1000 06/07/23 1015  BP: (!) 129/56 (!) 138/56  Pulse: (!) 57 (!) 54  Resp: 13 16  Temp:    SpO2: 100% 95%    Last Pain:  Vitals:   06/07/23 1015  TempSrc:   PainSc: 0-No pain                 Linton Rump

## 2023-06-07 NOTE — H&P (Signed)
PREOPERATIVE H&P  Chief Complaint: left knee osteoarthritis  HPI: Katie Burns is a 77 y.o. female who presents for surgical treatment of left knee osteoarthritis.  She denies any changes in medical history.  Past Surgical History:  Procedure Laterality Date   CATARACT EXTRACTION, BILATERAL Bilateral 2017   with lens implant   COLONOSCOPY     EYE SURGERY Bilateral 2015   cataract extractionwith lens implant   TOTAL KNEE ARTHROPLASTY Right 10/26/2022   Procedure: RIGHT TOTAL KNEE ARTHROPLASTY;  Surgeon: Tarry Kos, MD;  Location: [facility] OR;  Service: Orthopedics;  Laterality: Right;   Social History   Socioeconomic History   Marital status: Married    Spouse name: Not on file   Number of children: 6   Years of education: 5 th grade   Highest education level: Not on file  Occupational History   Occupation: unempolyed  Tobacco Use   Smoking status: Never   Smokeless tobacco: Never  Vaping Use   Vaping status: Never Used  Substance and Sexual Activity   Alcohol use: No   Drug use: No   Sexual activity: Not on file  Other Topics Concern   Not on file  Social History Narrative   Not on file   Social Determinants of Health   Financial Resource Strain: Not on file  Food Insecurity: No Food Insecurity (10/26/2022)   Hunger Vital Sign    Worried About Running Out of Food in the Last Year: Never true    Ran Out of Food in the Last Year: Never true  Transportation Needs: No Transportation Needs (10/26/2022)   PRAPARE - Administrator, Civil Service (Medical): No    Lack of Transportation (Non-Medical): No  Physical Activity: Not on file  Stress: Not on file  Social Connections: Not on file   Family History  Problem Relation Age of Onset   Hypertension Daughter    Breast cancer Neg Hx    Allergies  Allergen Reactions   Beef-Derived Products    Chicken Protein    Fish-Derived Products    Pork-Derived Products    Prior to Admission medications    Medication Sig Start Date End Date Taking? Authorizing Provider  aspirin EC 81 MG tablet Take 1 tablet (81 mg total) by mouth 2 (two) times daily. To be taken after surgery to prevent blood clots Patient taking differently: Take 81 mg by mouth daily. 10/19/22 10/19/23 Yes Cristie Hem, PA-C  atorvastatin (LIPITOR) 20 MG tablet TAKE 1 TABLET(20 MG) BY MOUTH DAILY 10/20/22  Yes Hoy Register, MD  Cholecalciferol (VITAMIN D3 PO) Take 1 tablet by mouth daily.   Yes [provider]  cycloSPORINE (RESTASIS) 0.05 % ophthalmic emulsion Place 1 drop into both eyes daily as needed (dry/irritated eyes).   Yes [provider]  diclofenac Sodium (VOLTAREN) 1 % GEL Apply 2 g topically 4 (four) times daily. Patient taking differently: Apply 2 g topically in the morning and at bedtime. 08/06/22  Yes Anders Simmonds, PA-C  ferrous sulfate (FEROSUL) 325 (65 FE) MG tablet Take one tab PO Q Mon/Wed and Frid. Patient taking differently: Take 325 mg by mouth daily. 08/06/22  Yes McClung, Angela M, PA-C  losartan (COZAAR) 50 MG tablet Take 50 mg by mouth daily. 04/26/23  Yes [provider]  Multiple Vitamins-Minerals (CENTRUM SILVER ADULT 50+ PO) Take 1 tablet by mouth daily.   Yes [provider]  omeprazole (PRILOSEC) 40 MG capsule Take 40 mg by mouth  daily. 04/26/23  Yes [provider]  triamcinolone cream (KENALOG) 0.1 % Apply 1 Application topically 2 (two) times daily. 04/29/23  Yes [provider]  amLODipine (NORVASC) 5 MG tablet TAKE 1 TABLET(5 MG) BY MOUTH DAILY Patient not taking: Reported on 05/12/2023 04/21/23   Marcine Matar, MD  Blood Pressure Monitor DEVI Use as directed to check home blood pressure 2-3 times a week 02/16/19   Marcine Matar, MD  docusate sodium (COLACE) 100 MG capsule Take 1 capsule (100 mg total) by mouth daily as needed. Patient not taking: Reported on 05/12/2023 10/19/22 10/19/23  Cristie Hem, PA-C  furosemide (LASIX)  20 MG tablet TAKE 1 TABLET(20 MG) BY MOUTH DAILY Patient not taking: Reported on 05/12/2023 10/20/22   Hoy Register, MD  magnesium oxide (MAG-OX) 400 (240 Mg) MG tablet Take 400 mg by mouth daily.    [provider]  methocarbamol (ROBAXIN-750) 750 MG tablet Take 1 tablet (750 mg total) by mouth 2 (two) times daily as needed for muscle spasms. Patient not taking: Reported on 05/12/2023 11/10/22   Cristie Hem, PA-C  ondansetron (ZOFRAN) 4 MG tablet Take 1 tablet (4 mg total) by mouth every 8 (eight) hours as needed for nausea or vomiting. Patient not taking: Reported on 05/12/2023 10/19/22   Cristie Hem, PA-C  oxyCODONE-acetaminophen (PERCOCET) 5-325 MG tablet Take 1-2 tablets by mouth every 8 (eight) hours as needed. To be taken after surgery Patient not taking: Reported on 05/12/2023 11/10/22   Cristie Hem, PA-C     Positive ROS: All other systems have been reviewed and were otherwise negative with the exception of those mentioned in the HPI and as above.  Physical Exam: General: Alert, no acute distress Cardiovascular: No pedal edema Respiratory: No cyanosis, no use of accessory musculature GI: abdomen soft Skin: No lesions in the area of chief complaint Neurologic: Sensation intact distally Psychiatric: Patient is competent for consent with normal mood and affect Lymphatic: no lymphedema  MUSCULOSKELETAL: exam stable  Assessment: left knee osteoarthritis  Plan: Plan for Procedure(s): LEFT TOTAL KNEE ARTHROPLASTY  The risks benefits and alternatives were discussed with the patient including but not limited to the risks of nonoperative treatment, versus surgical intervention including infection, bleeding, nerve injury,  blood clots, cardiopulmonary complications, morbidity, mortality, among others, and they were willing to proceed.   Glee Arvin, MD 06/07/2023 6:02 AM

## 2023-06-08 ENCOUNTER — Encounter (HOSPITAL_COMMUNITY): Payer: Self-pay | Admitting: Orthopaedic Surgery

## 2023-06-08 DIAGNOSIS — M1712 Unilateral primary osteoarthritis, left knee: Secondary | ICD-10-CM | POA: Diagnosis not present

## 2023-06-08 NOTE — Evaluation (Signed)
Occupational Therapy Evaluation and Discharge Patient Details Name: Katie Burns MRN: [id number] DOB: 1946-07-23 Today's Date: 06/08/2023   History of Present Illness 77 y.o. female presents to [facility] hospital on 06/07/2023 for elective L TKA. PMH includes cataract extraction, HTN, HLD, prediabetes.77 y.o. female presents to [facility] hospital on 06/07/2023 for elective L TKA. PMH includes cataract extraction, HTN, HLD, prediabetes.   Clinical Impression   This 77 yo female admitted and underwent above presents to acute OT with all education completed with pt and dtr, we will D/C from acute OT.       If plan is discharge home, recommend the following: A little help with bathing/dressing/bathroom;Assistance with cooking/housework;Assist for transportation;Help with stairs or ramp for entrance    Functional Status Assessment  Patient has had a recent decline in their functional status and demonstrates the ability to make significant improvements in function in a reasonable and predictable amount of time. (without further need for skilled OT)  Equipment Recommendations  None recommended by OT       Precautions / Restrictions Precautions Precautions: Fall;Knee Precaution Booklet Issued: Yes (comment) Restrictions Weight Bearing Restrictions: No LLE Weight Bearing: Weight bearing as tolerated      Mobility Bed Mobility Overal bed mobility: Modified Independent Bed Mobility: Supine to Sit, Sit to Supine     Supine to sit: Modified independent (Device/Increase time) Sit to supine: Modified independent (Device/Increase time)        Transfers Overall transfer level: Needs assistance Equipment used: Rolling walker (2 wheels) Transfers: Sit to/from Stand Sit to Stand: Contact guard assist           General transfer comment: VCs for safe hand placement      Balance Overall balance assessment: Needs assistance Sitting-balance support: No upper extremity supported, Feet  supported Sitting balance-Leahy Scale: Good     Standing balance support: No upper extremity supported, During functional activity Standing balance-Leahy Scale: Fair Standing balance comment: standing to pull up pants                           ADL either performed or assessed with clinical judgement   ADL Overall ADL's : Needs assistance/impaired Eating/Feeding: Independent;Sitting   Grooming: Set up;Sitting   Upper Body Bathing: Set up;Sitting   Lower Body Bathing: Minimal assistance Lower Body Bathing Details (indicate cue type and reason): for feet, with contact guard A sit<>stand Upper Body Dressing : Set up;Sitting   Lower Body Dressing: Minimal assistance Lower Body Dressing Details (indicate cue type and reason): for socks/shoes with contact guard A sit<>stand Toilet Transfer: Contact guard assist   Toileting- Clothing Manipulation and Hygiene: Contact guard assist;Sit to/from stand               Vision Patient Visual Report: No change from baseline              Pertinent Vitals/Pain Pain Assessment Pain Assessment: 0-10 Pain Score: 6  Pain Location: left knee Pain Descriptors / Indicators: Sore, Aching Pain Intervention(s): Limited activity within patient's tolerance, Monitored during session, Repositioned, Ice applied     Extremity/Trunk Assessment Upper Extremity Assessment Upper Extremity Assessment: Overall WFL for tasks assessed           Communication Communication Communication: Other (comment) (pt prefers for daughter to interpret during session)   Cognition Arousal: Alert  Home Living Family/patient expects to be discharged to:: Private residence Living Arrangements: Children;Spouse/significant other Available Help at Discharge: Family;Available 24 hours/day Type of Home: House Home Access: Stairs to enter [employer] of Steps: 2    Home Layout: Two level Alternate Level Stairs-Number of Steps: 6-landing-6 Alternate Level Stairs-Rails: Right Bathroom Shower/Tub: Tub/shower unit;Curtain   Bathroom Toilet: Standard     Home Equipment: Agricultural consultant (2 wheels);Cane - single point;BSC/3in1;Grab bars - tub/shower;Hand held shower head;Tub bench   Additional Comments: daughter is taking 2 weeks off work to help out at home      Prior Functioning/Environment Prior Level of Function : Independent/Modified Independent             Mobility Comments: ambulating with SPC recently ADLs Comments: pt is independent with ADLs at baseline, has been only assisting with IADLs since last knee surgery        OT Problem List: Decreased strength;Decreased range of motion;Impaired balance (sitting and/or standing);Pain         OT Goals(Current goals can be found in the care plan section) Acute Rehab OT Goals Patient Stated Goal: home today OT Goal Formulation: With patient/family         AM-PAC OT "6 Clicks" Daily Activity     Outcome Measure Help from another person eating meals?: None Help from another person taking care of personal grooming?: A Little Help from another person toileting, which includes using toliet, bedpan, or urinal?: A Little Help from another person bathing (including washing, rinsing, drying)?: A Little Help from another person to put on and taking off regular upper body clothing?: A Little Help from another person to put on and taking off regular lower body clothing?: A Little 6 Click Score: 19   End of Session Equipment Utilized During Treatment: Rolling walker (2 wheels) Nurse Communication:  (dtr asking about CPM machine; other than that they are ready to go)  Activity Tolerance: Patient tolerated treatment well Patient left: in bed;with call bell/phone within reach;with family/visitor present (ice on knee)  OT Visit Diagnosis: Unsteadiness on feet (R26.81);Other abnormalities of gait  and mobility (R26.89);Pain Pain - Right/Left: Left Pain - part of body: Knee                Time: 4540-9811 OT Time Calculation (min): 15 min Charges:  OT General Charges $OT Visit: 1 Visit OT Evaluation $OT Eval Moderate Complexity: 1 Mod  Cathy L. OT Acute Rehabilitation Services Office [phone]    Evette Georges 06/08/2023, 9:39 AM

## 2023-06-08 NOTE — Progress Notes (Signed)
Subjective: 1 Day Post-Op Procedure(s) (LRB): LEFT TOTAL KNEE ARTHROPLASTY (Left) Patient reports pain as mild.    Objective: Vital signs in last 24 hours: Temp:  [97.8 F (36.6 C)-98.3 F (36.8 C)] 98.1 F (36.7 C) (09/10 0738) Pulse Rate:  [54-83] 66 (09/10 0738) Resp:  [13-20] 20 (09/10 0738) BP: (101-144)/(46-81) 101/52 (09/10 0738) SpO2:  [94 %-100 %] 96 % (09/10 0738)  Intake/Output from previous day: 09/09 0701 - 09/10 0700 In: 1340 [P.O.:240; I.V.:1100] Out: 1250 [Urine:1200; Blood:50] Intake/Output this shift: No intake/output data recorded.  No results for input(s): "HGB" in the last 72 hours. No results for input(s): "WBC", "RBC", "HCT", "PLT" in the last 72 hours. No results for input(s): "NA", "K", "CL", "CO2", "BUN", "CREATININE", "GLUCOSE", "CALCIUM" in the last 72 hours. No results for input(s): "LABPT", "INR" in the last 72 hours.  Neurologically intact Neurovascular intact Sensation intact distally Intact pulses distally Dorsiflexion/Plantar flexion intact Incision: dressing C/D/I No cellulitis present Compartment soft   Assessment/Plan: 1 Day Post-Op Procedure(s) (LRB): LEFT TOTAL KNEE ARTHROPLASTY (Left) Advance diet Up with therapy D/C IV fluids Discharge home with home health once cleared by PT and is able to urinate on her own WBAT LLE       Cristie Hem 06/08/2023, 7:54 AM

## 2023-06-08 NOTE — Progress Notes (Signed)
[employer] to deliver CPM machine to patients home. Harrold Donath with [employer] has daughters phone number for contact.

## 2023-06-08 NOTE — Progress Notes (Signed)
Physical Therapy Treatment Patient Details Name: Katie Burns MRN: [id number] DOB: 1946/04/14 Today's Date: 06/08/2023   History of Present Illness 77 y.o. female presents to [facility] hospital on 06/07/2023 for elective L TKA. PMH includes cataract extraction, HTN, HLD, prediabetes.77 y.o. female presents to [facility] hospital on 06/07/2023 for elective L TKA. PMH includes cataract extraction, HTN, HLD, prediabetes.    PT Comments  Pt tolerates treatment well, ambulating for increased distances and negotiating stairs without physical assistance. Pt demonstrates good LLE strength and stability when mobilizing at this time. PT provides cues to maintain UE support of RW until BLE are touching desired transfer surface rather than abandoning device mid-transfer, in an effort to reduce risk for falls. PT recommends discharge home when medically appropriate.    If plan is discharge home, recommend the following: A little help with bathing/dressing/bathroom;Assistance with cooking/housework;Assist for transportation   Can travel by private vehicle        Equipment Recommendations  None recommended by PT    Recommendations for Other Services       Precautions / Restrictions Precautions Precautions: Fall;Knee Precaution Booklet Issued: Yes (comment) Restrictions Weight Bearing Restrictions: Yes LLE Weight Bearing: Weight bearing as tolerated     Mobility  Bed Mobility Overal bed mobility: Modified Independent Bed Mobility: Supine to Sit, Sit to Supine     Supine to sit: Modified independent (Device/Increase time) Sit to supine: Modified independent (Device/Increase time)        Transfers Overall transfer level: Needs assistance Equipment used: Rolling walker (2 wheels) Transfers: Sit to/from Stand Sit to Stand: Supervision           General transfer comment: verbal cues to maintain walker with patient and turn all the way around toward seat prior to releasing walker     Ambulation/Gait Ambulation/Gait assistance: Supervision Gait Distance (Feet): 250 Feet Assistive device: Rolling walker (2 wheels) Gait Pattern/deviations: Step-through pattern Gait velocity: reduced Gait velocity interpretation: 1.31 - 2.62 ft/sec, indicative of limited community ambulator   General Gait Details: slowed step-through gait   Stairs Stairs: Yes Stairs assistance: Supervision Stair Management: Two rails, Step to pattern, Forwards Number of Stairs: 10     Wheelchair Mobility     Tilt Bed    Modified Rankin (Stroke Patients Only)       Balance Overall balance assessment: Needs assistance Sitting-balance support: No upper extremity supported, Feet supported Sitting balance-Leahy Scale: Good     Standing balance support: Single extremity supported, Reliant on assistive device for balance Standing balance-Leahy Scale: Poor                              Cognition Arousal: Alert Behavior During Therapy: WFL for tasks assessed/performed Overall Cognitive Status: Within Functional Limits for tasks assessed                                          Exercises Total Joint Exercises Goniometric ROM: 77 degrees flexion, -3 degrees extension    General Comments General comments (skin integrity, edema, etc.): VSS on RA      Pertinent Vitals/Pain Pain Assessment Pain Assessment: Faces Faces Pain Scale: Hurts little more Pain Location: L lateral ankle and lower leg Pain Descriptors / Indicators: Sore Pain Intervention(s): Monitored during session    Home Living  Prior Function            PT Goals (current goals can now be found in the care plan section) Acute Rehab PT Goals Patient Stated Goal: to go home Progress towards PT goals: Progressing toward goals    Frequency    Min 1X/week      PT Plan      Co-evaluation              AM-PAC PT "6 Clicks" Mobility    Outcome Measure  Help needed turning from your back to your side while in a flat bed without using bedrails?: None Help needed moving from lying on your back to sitting on the side of a flat bed without using bedrails?: None Help needed moving to and from a bed to a chair (including a wheelchair)?: A Little Help needed standing up from a chair using your arms (e.g., wheelchair or bedside chair)?: A Little Help needed to walk in hospital room?: A Little Help needed climbing 3-5 steps with a railing? : A Little 6 Click Score: 20    End of Session   Activity Tolerance: Patient tolerated treatment well Patient left: in bed;with call bell/phone within reach Nurse Communication: Mobility status PT Visit Diagnosis: Other abnormalities of gait and mobility (R26.89);Muscle weakness (generalized) (M62.81)     Time: 4782-9562 PT Time Calculation (min) (ACUTE ONLY): 20 min  Charges:    $Gait Training: 8-22 mins PT General Charges $$ ACUTE PT VISIT: 1 Visit                     Arlyss Gandy, PT, DPT Acute Rehabilitation Office [phone]    Arlyss Gandy 06/08/2023, 8:41 AM

## 2023-06-08 NOTE — Plan of Care (Signed)
Pt and daughter given D/C instructions with verbal understanding. Rx's were sent to the pharmacy by MD. Pt's incision is clean and dry with no sign of infection. Pt's IV was removed prior to D/C. Home Health was arranged prior to D/C. CPM will be delivered to the Pt's home after D/C. Pt D/C'd home via wheelchair per MD order. Pt is stable @ D/C and has no other needs at this time. Rema Fendt, RN

## 2023-06-08 NOTE — Discharge Summary (Signed)
Patient ID: Katie Burns MRN: [id number] DOB/AGE: October 13, 1945 77 y.o.  Admit date: 06/07/2023 Discharge date: 06/08/2023  Admission Diagnoses:  Principal Problem:   Primary osteoarthritis of left knee Active Problems:   Status post total left knee replacement   Discharge Diagnoses:  Same  Past Medical History:  Diagnosis Date   Arthritis    GERD (gastroesophageal reflux disease)    Hyperlipidemia    Hypertension    Pre-diabetes    in MD's note- 08/06/22- A1C was 5.7    Surgeries: Procedure(s): LEFT TOTAL KNEE ARTHROPLASTY on 06/07/2023   Consultants:   Discharged Condition: Improved  Hospital Course: Katie Burns is an 77 y.o. female who was admitted 06/07/2023 for operative treatment ofPrimary osteoarthritis of left knee. Patient has severe unremitting pain that affects sleep, daily activities, and work/hobbies. After pre-op clearance the patient was taken to the operating room on 06/07/2023 and underwent  Procedure(s): LEFT TOTAL KNEE ARTHROPLASTY.    Patient was given perioperative antibiotics:  Anti-infectives (From admission, onward)    Start     Dose/Rate Route Frequency Ordered Stop   06/07/23 1330  ceFAZolin (ANCEF) IVPB 2g/100 mL premix        2 g 200 mL/hr over 30 Minutes Intravenous Every 6 hours 06/07/23 1100 06/07/23 1931   06/07/23 0813  vancomycin (VANCOCIN) powder  Status:  Discontinued          As needed 06/07/23 0813 06/07/23 0921   06/07/23 0600  ceFAZolin (ANCEF) IVPB 2g/100 mL premix        2 g 200 mL/hr over 30 Minutes Intravenous On call to O.R. 06/07/23 6295 06/07/23 0738   06/07/23 0556  ceFAZolin (ANCEF) 2-4 GM/100ML-% IVPB       Note to Pharmacy: Lacie Draft A: cabinet override      06/07/23 0556 06/07/23 0739        Patient was given sequential compression devices, early ambulation, and chemoprophylaxis to prevent DVT.  Patient benefited maximally from hospital stay and there were no complications.    Recent vital signs: Patient Vitals  for the past 24 hrs:  BP Temp Temp src Pulse Resp SpO2  06/08/23 0738 (!) 101/52 98.1 F (36.7 C) Oral 66 20 96 %  06/08/23 0539 105/81 97.8 F (36.6 C) Oral 62 20 97 %  06/07/23 2321 116/63 98.3 F (36.8 C) Oral 63 18 96 %  06/07/23 1951 (!) 114/57 98.3 F (36.8 C) Oral 64 18 95 %  06/07/23 1526 129/63 98 F (36.7 C) Oral 83 19 94 %  06/07/23 1123 127/73 97.8 F (36.6 C) -- (!) 56 18 100 %  06/07/23 1100 126/64 97.8 F (36.6 C) -- 73 14 98 %  06/07/23 1045 (!) 144/56 -- -- (!) 55 15 96 %  06/07/23 1030 (!) 141/46 -- -- (!) 57 17 95 %  06/07/23 1015 (!) 138/56 -- -- (!) 54 16 95 %  06/07/23 1000 (!) 129/56 -- -- (!) 57 13 100 %  06/07/23 0945 (!) 119/54 -- -- 71 18 97 %  06/07/23 0930 (!) 111/57 -- -- 79 20 96 %  06/07/23 0925 (!) 105/52 98.2 F (36.8 C) -- 76 19 97 %     Recent laboratory studies: No results for input(s): "WBC", "HGB", "HCT", "PLT", "NA", "K", "CL", "CO2", "BUN", "CREATININE", "GLUCOSE", "INR", "CALCIUM" in the last 72 hours.  Invalid input(s): "PT", "2"   Discharge Medications:   Allergies as of 06/08/2023       Reactions   Beef-derived Products  Chicken Protein    Fish-derived Products    Pork-derived Products         Medication List     STOP taking these medications    acetaminophen 325 MG tablet Commonly known as: TYLENOL   diclofenac Sodium 1 % Gel Commonly known as: Voltaren   furosemide 20 MG tablet Commonly known as: LASIX       TAKE these medications    amLODipine 5 MG tablet Commonly known as: NORVASC TAKE 1 TABLET(5 MG) BY MOUTH DAILY   aspirin EC 81 MG tablet Take 1 tablet (81 mg total) by mouth 2 (two) times daily. To be taken after surgery to prevent blood clots What changed:  when to take this additional instructions   atorvastatin 20 MG tablet Commonly known as: LIPITOR TAKE 1 TABLET(20 MG) BY MOUTH DAILY   Blood Pressure Monitor Devi Use as directed to check home blood pressure 2-3 times a week    CENTRUM SILVER ADULT 50+ PO Take 1 tablet by mouth daily.   cycloSPORINE 0.05 % ophthalmic emulsion Commonly known as: RESTASIS Place 1 drop into both eyes daily as needed (dry/irritated eyes).   docusate sodium 100 MG capsule Commonly known as: Colace Take 1 capsule (100 mg total) by mouth daily as needed.   ferrous sulfate 325 (65 FE) MG tablet Commonly known as: FeroSul Take one tab PO Q Mon/Wed and Frid. What changed:  how much to take how to take this when to take this additional instructions   losartan 50 MG tablet Commonly known as: COZAAR Take 50 mg by mouth daily.   magnesium oxide 400 (240 Mg) MG tablet Commonly known as: MAG-OX Take 400 mg by mouth daily.   methocarbamol 750 MG tablet Commonly known as: Robaxin-750 Take 1 tablet (750 mg total) by mouth 2 (two) times daily as needed for muscle spasms.   omeprazole 40 MG capsule Commonly known as: PRILOSEC Take 40 mg by mouth daily.   ondansetron 4 MG tablet Commonly known as: Zofran Take 1 tablet (4 mg total) by mouth every 8 (eight) hours as needed for nausea or vomiting.   oxyCODONE-acetaminophen 5-325 MG tablet Commonly known as: Percocet Take 1-2 tablets by mouth every 6 (six) hours as needed. To be taken after surgery What changed: when to take this   triamcinolone cream 0.1 % Commonly known as: KENALOG Apply 1 Application topically 2 (two) times daily.   VITAMIN D3 PO Take 1 tablet by mouth daily.               Durable Medical Equipment  (From admission, onward)           Start     Ordered   06/07/23 1101  DME Walker rolling  Once       Question Answer Comment  Walker: With 5 Inch Wheels   Patient needs a walker to treat with the following condition Status post left partial knee replacement      06/07/23 1100   06/07/23 1101  DME 3 n 1  Once        06/07/23 1100   06/07/23 1101  DME Bedside commode  Once       Question:  Patient needs a bedside commode to treat with the  following condition  Answer:  Status post left partial knee replacement   06/07/23 1100            Diagnostic Studies: DG Knee Left Port  Result Date: 06/07/2023 CLINICAL DATA:  Postop left  knee replacement. EXAM: PORTABLE LEFT KNEE - 1-2 VIEW COMPARISON:  None Available. FINDINGS: Left knee arthroplasty in expected alignment. No periprosthetic lucency or fracture. There has been patellar resurfacing. Recent postsurgical change includes air and edema in the soft tissues and joint space. IMPRESSION: Left knee arthroplasty without immediate postoperative complication. Electronically Signed   By: Narda Rutherford M.D.   On: 06/07/2023 10:25   MM 3D SCREENING MAMMOGRAM BILATERAL BREAST  Result Date: 05/28/2023 CLINICAL DATA:  Screening. EXAM: DIGITAL SCREENING BILATERAL MAMMOGRAM WITH TOMOSYNTHESIS AND CAD TECHNIQUE: Bilateral screening digital craniocaudal and mediolateral oblique mammograms were obtained. Bilateral screening digital breast tomosynthesis was performed. The images were evaluated with computer-aided detection. COMPARISON:  Previous exam(s). ACR Breast Density Category b: There are scattered areas of fibroglandular density. FINDINGS: There are no findings suspicious for malignancy. IMPRESSION: No mammographic evidence of malignancy. A result letter of this screening mammogram will be mailed directly to the patient. RECOMMENDATION: Screening mammogram in one year. (Code:SM-B-01Y) BI-RADS CATEGORY  1: Negative. Electronically Signed   By: Amie Portland M.D.   On: 05/28/2023 09:00    Disposition: Discharge disposition: 01-Home or Self Care          Follow-up Information     Cristie Hem, PA-C. Schedule an appointment as soon as possible for a visit in 2 week(s).   Specialty: Orthopedic Surgery Contact information: [address] [place] [address] [phone]         [facility] Follow up.   Specialty: Home Health Services Why: The home health agency  will contact you for the first home visit Contact information: [address] [place] [address] [phone]                  Signed: Cristie Hem 06/08/2023, 7:54 AM

## 2023-06-09 ENCOUNTER — Telehealth: Payer: Self-pay

## 2023-06-09 NOTE — Transitions of Care (Post Inpatient/ED Visit) (Signed)
   06/09/2023  Name: Katie Burns MRN: [id number] DOB: 07/25/1946  Today's TOC FU Call Status: Today's TOC FU Call Status:: Successful TOC FU Call Completed TOC FU Call Complete Date: 06/09/23 Patient's Name and Date of Birth confirmed.  Transition Care Management Follow-up Telephone Call Date of Discharge: 06/08/23 Discharge Facility: [facility]) Type of Discharge: Inpatient Admission Primary Inpatient Discharge Diagnosis:: left TKR How have you been since you were released from the hospital?: Better (her daughter said she is doing " good.") Any questions or concerns?: No  Items Reviewed: Did you receive and understand the discharge instructions provided?: Yes Medications obtained,verified, and reconciled?: Partial Review Completed Reason for Partial Mediation Review: Her daughter said she has all of her mother's medications except the colace which she will need to get OTC.  She did not have any questions about the meds and did not need to review the med list. Any new allergies since your discharge?: No Dietary orders reviewed?: Yes Type of Diet Ordered:: heart healthy, low sodium Do you have support at home?: Yes People in Home: child(ren), adult Name of Support/Comfort Primary Source: She lives with her daughter and son in Social worker.  Medications Reviewed Today: Medications Reviewed Today   Medications were not reviewed in this encounter     Home Care and Equipment/Supplies: Were Home Health Services Ordered?: Yes Name of Home Health Agency:: [facility] Has Agency set up a time to come to your home?: Yes First Home Health Visit Date: 06/09/23 Any new equipment or medical supplies ordered?: Yes Name of Medical supply agency?: RW and 3:1 commode recevied in the hospital.  CPM from [employer] delivered to her home. Were you able to get the equipment/medical supplies?: Yes Do you have any questions related to the use of the equipment/supplies?: No  Functional Questionnaire: Do  you need assistance with bathing/showering or dressing?: Yes Do you need assistance with meal preparation?: Yes Do you need assistance with eating?: No Do you have difficulty maintaining continence: No Do you need assistance with getting out of bed/getting out of a chair/moving?: Yes (She is ambulating with a RW) Do you have difficulty managing or taking your medications?: Yes (her daughter helps with managing her medications.)  Follow up appointments reviewed: PCP Follow-up appointment confirmed?: Yes Date of PCP follow-up appointment?: 06/11/23 Follow-up Provider: her daughter reports that her mother has a new PCP because Dr Laural Benes is out of network with her insurance Specialist Hospital Follow-up appointment confirmed?: Yes Date of Specialist follow-up appointment?: 06/22/23 Follow-Up Specialty Provider:: orthopedic surgery. Do you need transportation to your follow-up appointment?: No Do you understand care options if your condition(s) worsen?: Yes-patient verbalized understanding    SIGNATURE Robyne Peers, RN

## 2023-06-09 NOTE — Telephone Encounter (Signed)
The patient's daughter reports that her mother receives PCS 7 days/week.

## 2023-06-15 ENCOUNTER — Telehealth: Payer: Self-pay | Admitting: Orthopaedic Surgery

## 2023-06-15 NOTE — Telephone Encounter (Signed)
Patients daughter Katie Burns is calling for patient, patient had TKA 9/9 and is requesting new rx for Oxycodone. Callback [phone].Marland KitchenMarland KitchenWalgreens/[address] [employer]

## 2023-06-15 NOTE — Telephone Encounter (Signed)
Received call from [facility] (PT) with [facility] advised patient is already being seen by [facility] as of last week. The number to contact patient is [phone]

## 2023-06-16 ENCOUNTER — Other Ambulatory Visit: Payer: Self-pay | Admitting: Physician Assistant

## 2023-06-16 MED ORDER — OXYCODONE-ACETAMINOPHEN 5-325 MG PO TABS: 1.0000 | ORAL_TABLET | Freq: Three times a day (TID) | ORAL | 0 refills | Status: DC | PRN

## 2023-06-16 NOTE — Telephone Encounter (Signed)
sent 

## 2023-06-22 ENCOUNTER — Encounter: Payer: Self-pay | Admitting: Physician Assistant

## 2023-06-22 ENCOUNTER — Ambulatory Visit (INDEPENDENT_AMBULATORY_CARE_PROVIDER_SITE_OTHER): Payer: Medicare (Managed Care) | Admitting: Physician Assistant

## 2023-06-22 DIAGNOSIS — Z96652 Presence of left artificial knee joint: Secondary | ICD-10-CM

## 2023-06-22 MED ORDER — OXYCODONE-ACETAMINOPHEN 5-325 MG PO TABS: 1.0000 | ORAL_TABLET | Freq: Three times a day (TID) | ORAL | 0 refills | Status: AC | PRN

## 2023-06-22 NOTE — Progress Notes (Signed)
Post-Op Visit Note   Patient: Katie Burns           Date of Birth: 12/31/1945           MRN: [id number] Visit Date: 06/22/2023 PCP: Pcp, No   Assessment & Plan:  Chief Complaint:  Chief Complaint  Patient presents with   Left Knee - Follow-up    Left total knee arthroplasty 06/07/2023   Visit Diagnoses:  1. Status post total left knee replacement     Plan: Patient is a very pleasant 77 year old female who is here today with Hindi interpreter.  She is 2 weeks status post left total knee replacement.  She has been doing well.  She has been taking oxycodone for pain.  She has been compliant taking baby aspirin twice daily for DVT prophylaxis.  She has also been getting home health physical therapy and is ambulating with a walker.  Examination of her left knee reveals a well-healing surgical incision with nylon sutures in place.  No evidence of infection or cellulitis.  Calf is soft nontender.  She is neurovascular intact distally.  Today, sutures were removed and Steri-Strips applied.  Postoperative instructions provided.  I put in a referral for outpatient physical therapy and have refilled her oxycodone.  Follow-up in 4 weeks for repeat evaluation and 2 view x-rays of the left knee.  Call with concerns or questions.  Follow-Up Instructions: Return in about 4 weeks (around 07/20/2023).   Orders:  No orders of the defined types were placed in this encounter.  No orders of the defined types were placed in this encounter.   Imaging: No new imaging  PMFS History: Patient Active Problem List   Diagnosis Date Noted   Status post total left knee replacement 06/07/2023   Primary osteoarthritis of left knee 01/19/2023   Status post total right knee replacement 10/26/2022   Prediabetes 11/19/2019   Essential hypertension 10/18/2017   Hyperlipidemia 10/18/2017   Obesity (BMI 30.0-34.9) 10/18/2017   Past Medical History:  Diagnosis Date   Arthritis    GERD (gastroesophageal reflux  disease)    Hyperlipidemia    Hypertension    Pre-diabetes    in MD's note- 08/06/22- A1C was 5.7    Family History  Problem Relation Age of Onset   Hypertension Daughter    Breast cancer Neg Hx     Past Surgical History:  Procedure Laterality Date   CATARACT EXTRACTION, BILATERAL Bilateral 2017   with lens implant   COLONOSCOPY     EYE SURGERY Bilateral 2015   cataract extractionwith lens implant   TOTAL KNEE ARTHROPLASTY Right 10/26/2022   Procedure: RIGHT TOTAL KNEE ARTHROPLASTY;  Surgeon: Tarry Kos, MD;  Location: [facility] OR;  Service: Orthopedics;  Laterality: Right;   TOTAL KNEE ARTHROPLASTY Left 06/07/2023   Procedure: LEFT TOTAL KNEE ARTHROPLASTY;  Surgeon: Tarry Kos, MD;  Location: [facility] OR;  Service: Orthopedics;  Laterality: Left;   Social History   Occupational History   Occupation: unempolyed  Tobacco Use   Smoking status: Never   Smokeless tobacco: Never  Vaping Use   Vaping status: Never Used  Substance and Sexual Activity   Alcohol use: No   Drug use: No   Sexual activity: Not on file

## 2023-06-25 ENCOUNTER — Other Ambulatory Visit: Payer: Self-pay

## 2023-06-25 ENCOUNTER — Telehealth: Payer: Self-pay | Admitting: Orthopaedic Surgery

## 2023-06-25 DIAGNOSIS — Z96652 Presence of left artificial knee joint: Secondary | ICD-10-CM

## 2023-06-25 NOTE — Telephone Encounter (Signed)
Pt's relative called stating Dr Roda Shutters sent referral for pt to have physical therapy but they don't accept pt's insurance. Pt is asking for referral to be sent to [facility]. Pt phone number is [phone] or [phone].

## 2023-06-25 NOTE — Telephone Encounter (Signed)
New order placed

## 2023-07-05 ENCOUNTER — Other Ambulatory Visit: Payer: Self-pay

## 2023-07-05 ENCOUNTER — Telehealth: Payer: Self-pay | Admitting: Orthopaedic Surgery

## 2023-07-05 ENCOUNTER — Telehealth: Payer: Self-pay | Admitting: Physician Assistant

## 2023-07-05 DIAGNOSIS — Z96652 Presence of left artificial knee joint: Secondary | ICD-10-CM

## 2023-07-05 NOTE — Telephone Encounter (Signed)
Order has been placed.

## 2023-07-05 NOTE — Telephone Encounter (Signed)
Leigh from [facility] wants to know if you could send the referral to [facility] in [place] [phone]. Only because the other place don't take her insurance. CB#[phone]

## 2023-07-05 NOTE — Telephone Encounter (Signed)
Leigh a PT with [facility] wants a referral at the [facility]. There phone is [phone] and Fax#[phone]. CB# [phone]

## 2023-07-06 NOTE — Telephone Encounter (Signed)
Faxed referral to number given

## 2023-07-09 ENCOUNTER — Encounter: Payer: Self-pay | Admitting: Physician Assistant

## 2023-07-09 ENCOUNTER — Ambulatory Visit: Payer: Medicare (Managed Care) | Admitting: Physician Assistant

## 2023-07-09 ENCOUNTER — Other Ambulatory Visit (INDEPENDENT_AMBULATORY_CARE_PROVIDER_SITE_OTHER): Payer: Medicare (Managed Care)

## 2023-07-09 DIAGNOSIS — Z96652 Presence of left artificial knee joint: Secondary | ICD-10-CM

## 2023-07-09 MED ORDER — HYDROCODONE-ACETAMINOPHEN 5-325 MG PO TABS: 1.0000 | ORAL_TABLET | Freq: Three times a day (TID) | ORAL | 0 refills | Status: AC | PRN

## 2023-07-09 NOTE — Progress Notes (Signed)
Post-Op Visit Note   Patient: Katie Burns           Date of Birth: Aug 18, 1946           MRN: [id number] Visit Date: 07/09/2023 PCP: Pcp, No   Assessment & Plan:  Chief Complaint:  Chief Complaint  Patient presents with   Left Knee - Follow-up    Left total knee arthroplasty 06/07/2023   Visit Diagnoses:  1. Status post total left knee replacement     Plan: Patient is a pleasant 77 year old female who comes in today 5 weeks status post left total knee replacement 06/07/2023.  She has been doing well.  She has been compliant taking baby aspirin twice daily for DVT prophylaxis.  She has an occasional pain but has run out of her pain medicine.  She has finished outpatient physical therapy as her insurance has changed and they no longer cover this service.  She has continued to work on a home exercise program.  Examination of the left knee reveals a fully healed surgical scar without complication.  Range of motion 0 to 100 degrees.  Stable to valgus varus stress.  She is neurovascular intact distally.  At this point, I weaned her down to [facility] and have sent this into her pharmacy.  Continue to push things with a home exercise program.  She may discontinue the use of her aspirin for DVT prophylaxis.  Follow-up in 7 weeks for recheck.  Call with concerns or questions in the meantime.  Follow-Up Instructions: Return in about 7 weeks (around 08/27/2023).   Orders:  Orders Placed This Encounter  Procedures   XR Knee 1-2 Views Left   No orders of the defined types were placed in this encounter.   Imaging: XR Knee 1-2 Views Left  Result Date: 07/09/2023 Well-seated prosthesis without complication   PMFS History: Patient Active Problem List   Diagnosis Date Noted   Status post total left knee replacement 06/07/2023   Primary osteoarthritis of left knee 01/19/2023   Status post total right knee replacement 10/26/2022   Prediabetes 11/19/2019   Essential hypertension 10/18/2017    Hyperlipidemia 10/18/2017   Obesity (BMI 30.0-34.9) 10/18/2017   Past Medical History:  Diagnosis Date   Arthritis    GERD (gastroesophageal reflux disease)    Hyperlipidemia    Hypertension    Pre-diabetes    in MD's note- 08/06/22- A1C was 5.7    Family History  Problem Relation Age of Onset   Hypertension Daughter    Breast cancer Neg Hx     Past Surgical History:  Procedure Laterality Date   CATARACT EXTRACTION, BILATERAL Bilateral 2017   with lens implant   COLONOSCOPY     EYE SURGERY Bilateral 2015   cataract extractionwith lens implant   TOTAL KNEE ARTHROPLASTY Right 10/26/2022   Procedure: RIGHT TOTAL KNEE ARTHROPLASTY;  Surgeon: Tarry Kos, MD;  Location: [facility] OR;  Service: Orthopedics;  Laterality: Right;   TOTAL KNEE ARTHROPLASTY Left 06/07/2023   Procedure: LEFT TOTAL KNEE ARTHROPLASTY;  Surgeon: Tarry Kos, MD;  Location: [facility] OR;  Service: Orthopedics;  Laterality: Left;   Social History   Occupational History   Occupation: unempolyed  Tobacco Use   Smoking status: Never   Smokeless tobacco: Never  Vaping Use   Vaping status: Never Used  Substance and Sexual Activity   Alcohol use: No   Drug use: No   Sexual activity: Not on file

## 2023-07-19 ENCOUNTER — Encounter: Payer: Self-pay | Admitting: Physical Therapy

## 2023-07-19 ENCOUNTER — Ambulatory Visit: Payer: Medicare (Managed Care) | Attending: Orthopaedic Surgery | Admitting: Physical Therapy

## 2023-07-19 ENCOUNTER — Other Ambulatory Visit: Payer: Self-pay

## 2023-07-19 DIAGNOSIS — R2689 Other abnormalities of gait and mobility: Secondary | ICD-10-CM | POA: Diagnosis present

## 2023-07-19 DIAGNOSIS — Z96652 Presence of left artificial knee joint: Secondary | ICD-10-CM | POA: Insufficient documentation

## 2023-07-19 DIAGNOSIS — M25562 Pain in left knee: Secondary | ICD-10-CM | POA: Diagnosis present

## 2023-07-19 DIAGNOSIS — M6281 Muscle weakness (generalized): Secondary | ICD-10-CM | POA: Diagnosis present

## 2023-07-19 NOTE — Therapy (Signed)
OUTPATIENT PHYSICAL THERAPY LOWER EXTREMITY EVALUATION   Patient Name: Katie Burns MRN: [id number] DOB:[birth year], 77 y.o., female Today's Date: 07/19/2023  END OF SESSION:  07/19/23 1628  PT Visits / Re-Eval  Visit Number 1  Number of Visits 9 (8 + eval)  Date for PT Re-Evaluation 08/27/23  Authorization  Authorization Type MEDICAID [place] ACCESS  Authorization - Number of Visits  (pt has used 10 for right TKA and 8 in HHPT)  PT Time Calculation  PT Start Time 1620  PT Stop Time 1707  PT Time Calculation (min) 47 min  PT - End of Session  Equipment Utilized During Treatment Gait belt  Behavior During Therapy WFL for tasks assessed/performed    Past Medical History:  Diagnosis Date   Arthritis    GERD (gastroesophageal reflux disease)    Hyperlipidemia    Hypertension    Pre-diabetes    in MD's note- 08/06/22- A1C was 5.7   Past Surgical History:  Procedure Laterality Date   CATARACT EXTRACTION, BILATERAL Bilateral 2017   with lens implant   COLONOSCOPY     EYE SURGERY Bilateral 2015   cataract extractionwith lens implant   TOTAL KNEE ARTHROPLASTY Right 10/26/2022   Procedure: RIGHT TOTAL KNEE ARTHROPLASTY;  Surgeon: Tarry Kos, MD;  Location: [facility] OR;  Service: Orthopedics;  Laterality: Right;   TOTAL KNEE ARTHROPLASTY Left 06/07/2023   Procedure: LEFT TOTAL KNEE ARTHROPLASTY;  Surgeon: Tarry Kos, MD;  Location: [facility] OR;  Service: Orthopedics;  Laterality: Left;   Patient Active Problem List   Diagnosis Date Noted   Status post total left knee replacement 06/07/2023   Primary osteoarthritis of left knee 01/19/2023   Status post total right knee replacement 10/26/2022   Prediabetes 11/19/2019   Essential hypertension 10/18/2017   Hyperlipidemia 10/18/2017   Obesity (BMI 30.0-34.9) 10/18/2017    PCP: Himanshu Paliwal  REFERRING PROVIDER: Tarry Kos, MD  REFERRING DIAG: [phone] (ICD-10-CM) - Status post total left knee replacement  THERAPY DIAG:   Acute pain of left knee  Muscle weakness (generalized)  Other abnormalities of gait and mobility  Rationale for Evaluation and Treatment: Rehabilitation  ONSET DATE: 06/07/2023  SUBJECTIVE:   SUBJECTIVE STATEMENT: Patient presents ambulatory with [facility] which is baseline for her prior to TKA.  She states she completed 8 visits with HHPT for this TKA and 10 in OPPT for her right TKA earlier this year.  She presents with her daughter, Katie Burns, and interpreter.  Daughter states she has a booklet of exercises that she does and she walks and can do stairs, she just gets a little tired.  PERTINENT HISTORY: Bilateral TKA (left 06/07/2023 and right 10/26/2022), HLD, HTN  PAIN:  Are you having pain? No - well controlled with prescribed medicines  PRECAUTIONS: Fall  RED FLAGS: None   WEIGHT BEARING RESTRICTIONS: No  FALLS:  Has patient fallen in last 6 months? No  LIVING ENVIRONMENT: Lives with: lives with their spouse and lives with their daughter and son-in-law Lives in: House/apartment Stairs: Yes: Internal: full flight steps; on right going up, on left going up, and split level and External: 3 steps; can reach both Has following equipment at home: Single point cane, Walker - 2 wheeled, Tour manager, and Grab bars  OCCUPATION: Does not work.  PLOF: Independent with household mobility with device, Independent with transfers, Needs assistance with ADLs, and Needs assistance with homemaking - pt has a hard time with long distances  PATIENT GOALS: "She wants to strengthen her knees" -  daughter  NEXT MD VISIT: 08/24/2023 - Roda Shutters, Edwin Cap, MD (orthopedic follow-up)  OBJECTIVE:  Note: Objective measures were completed at Evaluation unless otherwise noted.  DIAGNOSTIC FINDINGS: Left knee x-ray from 07/09/2023:  "well seated prosthesis without complication"  PATIENT SURVEYS:  ABC scale 19.375%  - limited by daughter's response vs patient's  COGNITION: Overall cognitive status:   unsure; pt looks to daughter frequently throughout session allowing daughter to answer majority of questions.      SENSATION: WFL  EDEMA:  None significant on eval when compared to RLE  POSTURE: rounded shoulders and forward head  PALPATION: Pt has single steri-strip at top of incision and 3 at bottom.  Did not palpate over or within 1 inch on either side to preserve stability of incision and hygiene.  Patient not tender over all other aspects of left knee.  Incision is closed and healing well in appearance, pt reporting some tenderness in last strip region.  LOWER EXTREMITY ROM:  Active ROM Right eval Left eval  Hip flexion    Hip extension    Hip abduction    Hip adduction    Hip internal rotation    Hip external rotation    Knee flexion  102 degrees  Knee extension  Lacking 1 degree from neutral  Ankle dorsiflexion    Ankle plantarflexion    Ankle inversion    Ankle eversion     (Blank rows = not tested)  LOWER EXTREMITY MMT:  MMT Right eval Left eval  Hip flexion 4+/5 4+/5  Hip extension    Hip abduction    Hip adduction    Hip internal rotation    Hip external rotation    Knee flexion    Knee extension 4+/5 4+/5  Ankle dorsiflexion 5/5 5/5  Ankle plantarflexion    Ankle inversion    Ankle eversion     (Blank rows = not tested)  LOWER EXTREMITY SPECIAL TESTS:  None relevant - pt s/p TKA  FUNCTIONAL TESTS:  5 times sit to stand: 20.82 seconds w/ light BUE support Timed up and go (TUG): 24.53 sec w/ SPC SBA 2 minute walk test: To be assessed. 10 meter walk test: 15.13 seconds w/ SPC = 0.66 m/sec OR 2.18 ft/sec  GAIT: Distance walked: various clinic distances Assistive device utilized: Single point cane Level of assistance: Modified independence Comments: No overt instability noted.  Pt does not have antalgic pattern on assessment today and does not endorse pain during walking.  She    TODAY'S TREATMENT:                                                                                                                               DATE: N/A - eval only.   PATIENT EDUCATION:  Education details: PT POC, assessments used and to be used, and goals to be set. Person educated: Patient and Child(ren) Education method: Explanation and Handouts Education comprehension: verbalized understanding and needs further  education  HOME EXERCISE PROGRAM: To be established.  Pt has HHPT HEP which will likely overlap.  ASSESSMENT:  CLINICAL IMPRESSION: Patient is a 77 y.o. female who was seen today for physical therapy evaluation and treatment for left TKA.  Pt has a significant PMH of Bilateral TKA (left 06/07/2023 and right 10/26/2022), HLD, and HTN.  Identified impairments include mildly rounded shoulders and forward head posture, ongoing incision healing, lacking 1 degree of extension and able to flex knee to 102 degrees, and mild BLE weakness proximally.  Evaluation via the following assessment tools: 5xSTS, TUG, , and ABC Scale indicate fall risk.  to be assessed at next session for further endurance assessment.  She would benefit from skilled PT to address impairments as noted and progress towards long term goals.  OBJECTIVE IMPAIRMENTS: decreased activity tolerance, decreased balance, decreased endurance, decreased mobility, difficulty walking, decreased ROM, decreased strength, hypomobility, and postural dysfunction.   ACTIVITY LIMITATIONS: carrying, lifting, squatting, and locomotion level  PARTICIPATION LIMITATIONS: meal prep, cleaning, laundry, medication management, and driving  PERSONAL FACTORS: Age, Fitness, Past/current experiences, and 1 comorbidity: HTN  are also affecting patient's functional outcome.   REHAB POTENTIAL: Excellent  CLINICAL DECISION MAKING: Stable/uncomplicated  EVALUATION COMPLEXITY: Low   GOALS: Goals reviewed with patient? Yes  SHORT TERM GOALS: Target date: 08/20/2023 Pt will be independent and compliant  with strength and balance HEP in order to maintain functional progress and improve mobility. Baseline:  To be established. Goal status: INITIAL  2.  to be assessed with goal set as appropriate. Baseline:  To be assessed. Goal status: INITIAL  3.  Pt will decrease 5xSTS to </=15.82 seconds in order to demonstrate decreased risk for falls and improved functional bilateral LE strength and power. Baseline: 20.82 seconds w/ BUE Goal status: INITIAL  4.  Pt will demonstrate TUG of </=19.53 seconds in order to decrease risk of falls and improve functional mobility using LRAD. Baseline: 24.53 sec w/ SPC SBA Goal status: INITIAL  5.  Pt will demonstrate a gait speed of >/=2.38 feet/sec in order to decrease risk for falls. Baseline: 2.18 ft/sec w/ SPC Goal status: INITIAL  6.  Pt will improve ABC Scale score to >/=29.4% in order to demonstrate improved fear of falling and lowered fall risk factors. Baseline: 19.38% Goal status: INITIAL  PLAN:  PT FREQUENCY: 2x/week (daughter preference)  PT DURATION: 4 weeks  PLANNED INTERVENTIONS: 97164- PT Re-evaluation, 97110-Therapeutic exercises, 97530- Therapeutic activity, 97112- Neuromuscular re-education, 97535- Self Care, 16109- Manual therapy, [phone]- Gait training, Patient/Family education, Balance training, Stair training, Joint mobilization, Scar mobilization, and DME instructions  PLAN FOR NEXT SESSION: ASSESS - set goal.  Initiate functional strength and balance HEP.  Work on knee flexion.  Check all possible CPT codes: 09811 - PT Re-evaluation, 97110- Therapeutic Exercise, [phone]- Neuro Re-education, [phone] - Gait Training, [phone] - Manual Therapy, [phone] - Therapeutic Activities, and [phone] - Self Care    Check all conditions that are expected to impact treatment: Musculoskeletal disorders   If treatment provided at initial evaluation, no treatment charged due to lack of authorization.   Sadie Haber, PT, DPT 07/19/2023,  5:08 PM

## 2023-07-26 ENCOUNTER — Ambulatory Visit: Payer: Medicare (Managed Care)

## 2023-07-26 DIAGNOSIS — M25562 Pain in left knee: Secondary | ICD-10-CM | POA: Diagnosis not present

## 2023-07-26 DIAGNOSIS — M6281 Muscle weakness (generalized): Secondary | ICD-10-CM

## 2023-07-26 DIAGNOSIS — R2689 Other abnormalities of gait and mobility: Secondary | ICD-10-CM

## 2023-07-26 NOTE — Therapy (Signed)
OUTPATIENT PHYSICAL THERAPY LOWER EXTREMITY TREATMENT   Patient Name: Katie Burns MRN: [id number] DOB:12/19/1945, 77 y.o., female Today's Date: 07/26/2023  END OF SESSION:  PT End of Session - 07/26/23 0748     Visit Number 2    Number of Visits 9    Date for PT Re-Evaluation 08/27/23    Authorization Type MEDICAID [place] ACCESS    PT Start Time 0801    PT Stop Time 0843    PT Time Calculation (min) 42 min    Equipment Utilized During Treatment Gait belt    Activity Tolerance Patient tolerated treatment well    Behavior During Therapy WFL for tasks assessed/performed               Past Medical History:  Diagnosis Date   Arthritis    GERD (gastroesophageal reflux disease)    Hyperlipidemia    Hypertension    Pre-diabetes    in MD's note- 08/06/22- A1C was 5.7   Past Surgical History:  Procedure Laterality Date   CATARACT EXTRACTION, BILATERAL Bilateral 2017   with lens implant   COLONOSCOPY     EYE SURGERY Bilateral 2015   cataract extractionwith lens implant   TOTAL KNEE ARTHROPLASTY Right 10/26/2022   Procedure: RIGHT TOTAL KNEE ARTHROPLASTY;  Surgeon: Tarry Kos, MD;  Location: [facility] OR;  Service: Orthopedics;  Laterality: Right;   TOTAL KNEE ARTHROPLASTY Left 06/07/2023   Procedure: LEFT TOTAL KNEE ARTHROPLASTY;  Surgeon: Tarry Kos, MD;  Location: [facility] OR;  Service: Orthopedics;  Laterality: Left;   Patient Active Problem List   Diagnosis Date Noted   Status post total left knee replacement 06/07/2023   Primary osteoarthritis of left knee 01/19/2023   Status post total right knee replacement 10/26/2022   Prediabetes 11/19/2019   Essential hypertension 10/18/2017   Hyperlipidemia 10/18/2017   Obesity (BMI 30.0-34.9) 10/18/2017    PCP: Himanshu Paliwal  REFERRING PROVIDER: Tarry Kos, MD  REFERRING DIAG: [phone] (ICD-10-CM) - Status post total left knee replacement  THERAPY DIAG:  Muscle weakness (generalized)  Other abnormalities of gait  and mobility  Acute pain of left knee  Rationale for Evaluation and Treatment: Rehabilitation  ONSET DATE: 06/07/2023  SUBJECTIVE:   SUBJECTIVE STATEMENT: Patient arrives to clinic with [facility] and dtr. Denies falls. Dtr speaking on patients behalf.   PERTINENT HISTORY: Bilateral TKA (left 06/07/2023 and right 10/26/2022), HLD, HTN  PAIN:  Are you having pain? Yes: NPRS scale: 3-4/10 Pain location: L TKA    PRECAUTIONS: Fall  PATIENT GOALS: "She wants to strengthen her knees" - daughter  NEXT MD VISIT: 08/24/2023 - Tarry Kos, MD (orthopedic follow-up)  OBJECTIVE:  Note: Objective measures were completed at Evaluation unless otherwise noted.  DIAGNOSTIC FINDINGS: Left knee x-ray from 07/09/2023:  "well seated prosthesis without complication"   TODAY'S TREATMENT:  : 200' with SPC  -seated heel slides on towel   -progressed to resisted with green theraband  -supine scar massage for reduced adhesions and improved general elasticity   PATIENT EDUCATION:  Education details: exam findings, scar mobilization  Person educated: Patient and Child(ren) Education method: Explanation and Handouts Education comprehension: verbalized understanding and needs further education  HOME EXERCISE PROGRAM: To be established.  Pt has HHPT HEP which will likely overlap. -scar mobilization ASSESSMENT:  CLINICAL IMPRESSION: Patient seen for skilled PT session with emphasis on endurance assessment and scar mobility. 2 Min Walk Test:  Instructed patient to ambulate as quickly and as safely as possible for 2 minutes using LRAD. Patient was allowed to take standing rest breaks without stopping the test, but if the patient required a sitting rest break the clock would be stopped and the test would be over.  Results: 200 feet. Results indicate that the patient has reduced  endurance with ambulation compared to age matched and population- specific norms.  Age Matched and population specific Norms: community dwelling elderly adults: 467ft (MDC: 30ft); MS: 567.51ft (MDC: 54ft); SCI: 358.9ft. Patient with mild adhesions throughout L TKA scar improving with gentle scar massage- patient tolerated well. Noted to have increased swelling, 2+ pitting edema and mild redness, but no other overt signs of infection or DVT. Continue POC.    OBJECTIVE IMPAIRMENTS: decreased activity tolerance, decreased balance, decreased endurance, decreased mobility, difficulty walking, decreased ROM, decreased strength, hypomobility, and postural dysfunction.   ACTIVITY LIMITATIONS: carrying, lifting, squatting, and locomotion level  PARTICIPATION LIMITATIONS: meal prep, cleaning, laundry, medication management, and driving  PERSONAL FACTORS: Age, Fitness, Past/current experiences, and 1 comorbidity: HTN  are also affecting patient's functional outcome.   REHAB POTENTIAL: Excellent  CLINICAL DECISION MAKING: Stable/uncomplicated  EVALUATION COMPLEXITY: Low   GOALS: Goals reviewed with patient? Yes  SHORT TERM GOALS: Target date: 08/20/2023 Pt will be independent and compliant with strength and balance HEP in order to maintain functional progress and improve mobility. Baseline:  To be established. Goal status: INITIAL  2.  Patient will ambulate >/= 250' during to demonstrate improved community ambulation  Baseline:  200' Goal status: REVISED  3.  Pt will decrease 5xSTS to </=15.82 seconds in order to demonstrate decreased risk for falls and improved functional bilateral LE strength and power. Baseline: 20.82 seconds w/ BUE Goal status: INITIAL  4.  Pt will demonstrate TUG of </=19.53 seconds in order to decrease risk of falls and improve functional mobility using LRAD. Baseline: 24.53 sec w/ SPC SBA Goal status: INITIAL  5.  Pt will demonstrate a gait speed of >/=2.38  feet/sec in order to decrease risk for falls. Baseline: 2.18 ft/sec w/ SPC Goal status: INITIAL  6.  Pt will improve ABC Scale score to >/=29.4% in order to demonstrate improved fear of falling and lowered fall risk factors. Baseline: 19.38% Goal status: INITIAL  PLAN:  PT FREQUENCY: 2x/week (daughter preference)  PT DURATION: 4 weeks  PLANNED INTERVENTIONS: 97164- PT Re-evaluation, 97110-Therapeutic exercises, 97530- Therapeutic activity, 97112- Neuromuscular re-education, 97535- Self Care, 16109- Manual therapy, [phone]- Gait training, Patient/Family education, Balance training, Stair training, Joint mobilization, Scar mobilization, and DME instructions  PLAN FOR NEXT SESSION: ASSESS - set goal.  Initiate functional strength and balance HEP.  Work on knee flexion.  Check all possible CPT codes: 09811 - PT Re-evaluation, 97110- Therapeutic Exercise, [phone]- Neuro Re-education, [phone] - Gait Training, [phone] - Manual Therapy, 97530 - Therapeutic Activities, and 97535 - Self Care  Check all conditions that are expected to impact treatment: Musculoskeletal disorders   If treatment provided at initial evaluation, no treatment charged due to lack of authorization.   Westley Foots, PT Westley Foots, PT, DPT, CBIS  07/26/2023, 9:31 AM

## 2023-07-29 ENCOUNTER — Ambulatory Visit: Payer: Medicare (Managed Care) | Admitting: Physical Therapy

## 2023-08-02 ENCOUNTER — Ambulatory Visit: Payer: Medicare (Managed Care) | Admitting: Physical Therapy

## 2023-08-02 ENCOUNTER — Ambulatory Visit: Payer: Medicare (Managed Care) | Attending: Orthopaedic Surgery | Admitting: Physical Therapy

## 2023-08-02 ENCOUNTER — Other Ambulatory Visit: Payer: Self-pay | Admitting: Family Medicine

## 2023-08-02 DIAGNOSIS — I10 Essential (primary) hypertension: Secondary | ICD-10-CM

## 2023-08-02 DIAGNOSIS — M25562 Pain in left knee: Secondary | ICD-10-CM | POA: Insufficient documentation

## 2023-08-02 DIAGNOSIS — R2689 Other abnormalities of gait and mobility: Secondary | ICD-10-CM | POA: Insufficient documentation

## 2023-08-02 DIAGNOSIS — M6281 Muscle weakness (generalized): Secondary | ICD-10-CM | POA: Insufficient documentation

## 2023-08-02 DIAGNOSIS — Z96651 Presence of right artificial knee joint: Secondary | ICD-10-CM | POA: Insufficient documentation

## 2023-08-02 NOTE — Therapy (Signed)
OUTPATIENT PHYSICAL THERAPY LOWER EXTREMITY TREATMENT   Patient Name: Katie Burns MRN: [id number] DOB:[birth year], 77 y.o., female Today's Date: 08/02/2023  END OF SESSION:  PT End of Session - 08/02/23 1534     Visit Number 3    Number of Visits 9    Date for PT Re-Evaluation 08/27/23    Authorization Type MEDICAID  ACCESS    PT Start Time 1530    PT Stop Time 1619    PT Time Calculation (min) 49 min    Equipment Utilized During Treatment Gait belt    Activity Tolerance Patient tolerated treatment well    Behavior During Therapy WFL for tasks assessed/performed                Past Medical History:  Diagnosis Date   Arthritis    GERD (gastroesophageal reflux disease)    Hyperlipidemia    Hypertension    Pre-diabetes    in MD's note- 08/06/22- A1C was 5.7   Past Surgical History:  Procedure Laterality Date   CATARACT EXTRACTION, BILATERAL Bilateral 2017   with lens implant   COLONOSCOPY     EYE SURGERY Bilateral 2015   cataract extractionwith lens implant   TOTAL KNEE ARTHROPLASTY Right 10/26/2022   Procedure: RIGHT TOTAL KNEE ARTHROPLASTY;  Surgeon: Tarry Kos, MD;  Location: [facility];  Service: Orthopedics;  Laterality: Right;   TOTAL KNEE ARTHROPLASTY Left 06/07/2023   Procedure: LEFT TOTAL KNEE ARTHROPLASTY;  Surgeon: Tarry Kos, MD;  Location: [facility] OR;  Service: Orthopedics;  Laterality: Left;   Patient Active Problem List   Diagnosis Date Noted   Status post total left knee replacement 06/07/2023   Primary osteoarthritis of left knee 01/19/2023   Status post total right knee replacement 10/26/2022   Prediabetes 11/19/2019   Essential hypertension 10/18/2017   Hyperlipidemia 10/18/2017   Obesity (BMI 30.0-34.9) 10/18/2017    PCP: Himanshu Paliwal  REFERRING PROVIDER: Tarry Kos, MD  REFERRING DIAG: [phone] (ICD-10-CM) - Status post total left knee replacement  THERAPY DIAG:  Muscle weakness (generalized)  Other abnormalities of gait  and mobility  Acute pain of left knee  Total knee replacement status, right  Rationale for Evaluation and Treatment: Rehabilitation  ONSET DATE: 06/07/2023  SUBJECTIVE:   SUBJECTIVE STATEMENT:  Patient arrives to clinic with [facility] and her daughter. Pt denies any falls since last visit, reports she has just a "little bit of pain", 3/10 in her L knee.  Pt reports her HEP from HHPT is going well. Pt has been working on massaging her surgical scar as reviewed last session.  Accompanied by: daughter, Janene Harvey (interpreter)  PERTINENT HISTORY: Bilateral TKA (left 06/07/2023 and right 10/26/2022), HLD, HTN  PAIN:  Are you having pain? Yes: NPRS scale: 3/10 Pain location: L TKA    PRECAUTIONS: Fall  PATIENT GOALS: "She wants to strengthen her knees" - daughter  NEXT MD VISIT: 08/24/2023 - Tarry Kos, MD (orthopedic follow-up)  OBJECTIVE:  Note: Objective measures were completed at Evaluation unless otherwise noted.  DIAGNOSTIC FINDINGS: Left knee x-ray from 07/09/2023:  "well seated prosthesis without complication"   TODAY'S TREATMENT:              TherEx SciFit multi-peaks level 3 for 8 minutes using BLEs for neural priming for reciprocal movement, dynamic cardiovascular warmup and increased amplitude of stepping as well as for increased B knee ROM.   Pt able to demonstrate exercises that she performs as part of her HEP: Standing exercises at countertop  with BUE support: Hip abd, marches, HS curls, heel raises, walking up/down stairs with step-to gait pattern (ascending with RLE, descending with LLE), lunge stretch at bottom of stairs At bottom of stairs encouraged pt to keep her L heel planted during stretch Supine exercises on mat table: Marches, ankle pumps Reviewed heel slides with use of sheet to increase stretch and SLR, encouraged pt to add to her HEP (she ceclines handout)   Seated chair pushes/pulls x 10 ft forwards/backwards Pt needs max verbal cueing and  demonstration to perform exercise correctly, has onset of B knee pain with HS pulls   PATIENT EDUCATION:  Education details: continue HEP with clarifications, added to HEP Person educated: Patient and Child(ren) Education method: Explanation, Demonstration, Tactile cues, and Verbal cues Education comprehension: verbalized understanding and needs further education  HOME EXERCISE PROGRAM:  Pt has HHPT HEP which will likely overlap. (Standing hip abd, marches, HS curls, heel raises, walking up/down stairs, lunge stretch; supine marches, ankle pumps)  Verbally added: -scar mobilization -assisted heel slides with sheet -SLR  ASSESSMENT:  CLINICAL IMPRESSION:  Emphasis of skilled PT session on reviewing patient's current HEP and adding appropriate exercises to HEP to work on L knee ROM and strengthening. Encouraged pt to continue to focus on stretching of her L knee joint and strengthening of muscles around her L knee for return to function. Pt also continues to exhibit decreased weight shift to the L during gait and can benefit from continued practice of return to normal, reciprocal gait pattern. Pt remains very motivated and exhibits great participation in therapy sessions. Continue POC.    OBJECTIVE IMPAIRMENTS: decreased activity tolerance, decreased balance, decreased endurance, decreased mobility, difficulty walking, decreased ROM, decreased strength, hypomobility, and postural dysfunction.   ACTIVITY LIMITATIONS: carrying, lifting, squatting, and locomotion level  PARTICIPATION LIMITATIONS: meal prep, cleaning, laundry, medication management, and driving  PERSONAL FACTORS: Age, Fitness, Past/current experiences, and 1 comorbidity: HTN  are also affecting patient's functional outcome.   REHAB POTENTIAL: Excellent  CLINICAL DECISION MAKING: Stable/uncomplicated  EVALUATION COMPLEXITY: Low   GOALS: Goals reviewed with patient? Yes  SHORT TERM GOALS: Target date:  08/20/2023 Pt will be independent and compliant with strength and balance HEP in order to maintain functional progress and improve mobility. Baseline:  To be established. Goal status: INITIAL  2.  Patient will ambulate >/= 250' during to demonstrate improved community ambulation  Baseline:  200' Goal status: REVISED  3.  Pt will decrease 5xSTS to </=15.82 seconds in order to demonstrate decreased risk for falls and improved functional bilateral LE strength and power. Baseline: 20.82 seconds w/ BUE Goal status: INITIAL  4.  Pt will demonstrate TUG of </=19.53 seconds in order to decrease risk of falls and improve functional mobility using LRAD. Baseline: 24.53 sec w/ SPC SBA Goal status: INITIAL  5.  Pt will demonstrate a gait speed of >/=2.38 feet/sec in order to decrease risk for falls. Baseline: 2.18 ft/sec w/ SPC Goal status: INITIAL  6.  Pt will improve ABC Scale score to >/=29.4% in order to demonstrate improved fear of falling and lowered fall risk factors. Baseline: 19.38% Goal status: INITIAL  PLAN:  PT FREQUENCY: 2x/week (daughter preference)  PT DURATION: 4 weeks  PLANNED INTERVENTIONS: 97164- PT Re-evaluation, 97110-Therapeutic exercises, 97530- Therapeutic activity, 97112- Neuromuscular re-education, 97535- Self Care, 16109- Manual therapy, [phone]- Gait training, Patient/Family education, Balance training, Stair training, Joint mobilization, Scar mobilization, and DME instructions  PLAN FOR NEXT SESSION:  add PRN to functional strength and  balance HEP.  Work on knee flexion. SciFit vs NuStep, quad strengthening, reciprocal gait pattern with increased weight shift to the L and stance time on LLE  Check all possible CPT codes: 08657 - PT Re-evaluation, 97110- Therapeutic Exercise, [id number]- Neuro Re-education, [phone] - Gait Training, [phone] - Manual Therapy, 97530 - Therapeutic Activities, and 97535 - Self Care    Check all conditions that are expected to impact  treatment: Musculoskeletal disorders   If treatment provided at initial evaluation, no treatment charged due to lack of authorization.   Peter Congo, PT Peter Congo, PT, DPT, CSRS    08/02/2023, 4:19 PM

## 2023-08-05 ENCOUNTER — Ambulatory Visit: Payer: Medicare (Managed Care) | Admitting: Physical Therapy

## 2023-08-09 ENCOUNTER — Encounter: Payer: Self-pay | Admitting: Physical Therapy

## 2023-08-09 ENCOUNTER — Ambulatory Visit: Payer: Medicare (Managed Care) | Admitting: Physical Therapy

## 2023-08-09 VITALS — BP 154/58 | HR 68

## 2023-08-09 DIAGNOSIS — M25562 Pain in left knee: Secondary | ICD-10-CM

## 2023-08-09 DIAGNOSIS — R2689 Other abnormalities of gait and mobility: Secondary | ICD-10-CM

## 2023-08-09 DIAGNOSIS — M6281 Muscle weakness (generalized): Secondary | ICD-10-CM

## 2023-08-09 DIAGNOSIS — Z96651 Presence of right artificial knee joint: Secondary | ICD-10-CM

## 2023-08-09 NOTE — Therapy (Signed)
OUTPATIENT PHYSICAL THERAPY LOWER EXTREMITY TREATMENT   Patient Name: Katie Burns MRN: [id number] DOB:01-Jun-1946, 77 y.o., female Today's Date: 08/09/2023  END OF SESSION:  PT End of Session - 08/09/23 0806     Visit Number 4    Number of Visits 9    Date for PT Re-Evaluation 08/27/23    Authorization Type MEDICAID [place] ACCESS    PT Start Time 0803    PT Stop Time 0846    PT Time Calculation (min) 43 min    Equipment Utilized During Treatment Gait belt    Activity Tolerance Patient tolerated treatment well    Behavior During Therapy WFL for tasks assessed/performed              Past Medical History:  Diagnosis Date   Arthritis    GERD (gastroesophageal reflux disease)    Hyperlipidemia    Hypertension    Pre-diabetes    in MD's note- 08/06/22- A1C was 5.7   Past Surgical History:  Procedure Laterality Date   CATARACT EXTRACTION, BILATERAL Bilateral 2017   with lens implant   COLONOSCOPY     EYE SURGERY Bilateral 2015   cataract extractionwith lens implant   TOTAL KNEE ARTHROPLASTY Right 10/26/2022   Procedure: RIGHT TOTAL KNEE ARTHROPLASTY;  Surgeon: Tarry Kos, MD;  Location: [facility];  Service: Orthopedics;  Laterality: Right;   TOTAL KNEE ARTHROPLASTY Left 06/07/2023   Procedure: LEFT TOTAL KNEE ARTHROPLASTY;  Surgeon: Tarry Kos, MD;  Location: [facility];  Service: Orthopedics;  Laterality: Left;   Patient Active Problem List   Diagnosis Date Noted   Status post total left knee replacement 06/07/2023   Primary osteoarthritis of left knee 01/19/2023   Status post total right knee replacement 10/26/2022   Prediabetes 11/19/2019   Essential hypertension 10/18/2017   Hyperlipidemia 10/18/2017   Obesity (BMI 30.0-34.9) 10/18/2017    PCP: Himanshu Paliwal  REFERRING PROVIDER: Tarry Kos, MD  REFERRING DIAG: [phone] (ICD-10-CM) - Status post total left knee replacement  THERAPY DIAG:  Muscle weakness (generalized)  Other abnormalities of gait and  mobility  Acute pain of left knee  Total knee replacement status, right  Rationale for Evaluation and Treatment: Rehabilitation  ONSET DATE: 06/07/2023  SUBJECTIVE:   SUBJECTIVE STATEMENT:  Patient arrives to clinic with [facility]. Patient denies falls/near falls. She continues to work on [employer]. States the L side is more challenging to use than her R due to newer TKA on this side. Reports pain 2-3.5/10.  Accompanied by: interpreter, [place]  PERTINENT HISTORY: Bilateral TKA (left 06/07/2023 and right 10/26/2022), HLD, HTN  PAIN:  Are you having pain? Yes: NPRS scale: 2.5-3/10 Pain location: L TKA    PRECAUTIONS: Fall  PATIENT GOALS: "She wants to strengthen her knees" - daughter  NEXT MD VISIT: 08/24/2023 - Tarry Kos, MD (orthopedic follow-up)  OBJECTIVE:  Note: Objective measures were completed at Evaluation unless otherwise noted.  DIAGNOSTIC FINDINGS: Left knee x-ray from 07/09/2023:  "well seated prosthesis without complication"   TODAY'S TREATMENT:     Vitals:   08/09/23 0811  BP: (!) 154/58  Pulse: 68  Seated on L arm           TherEx Nustep LE only x 6 min to promote knee flexion and extension at level 4  - requires rest break at 5 min due to fatigue in LLE  TKE in standing with resistance against yellow theraband 2 x 10, second set added 5" hold Hamstring pull back 1  x 10 reps in seated Staggered sit to stand with LLE back for LLE weight acceptance 1 x 10 Step up taps to 4" box with RLE for stance on LLE 1 x 8 Step up without UE support with LLE in stance 2 x 5 with CGA Increased challenge with weight acceptance onto LLE  PATIENT EDUCATION:  Education details: Continue HEP Person educated: Patient and Child(ren) Education method: Explanation, Demonstration, Tactile cues, and Verbal cues Education comprehension: verbalized understanding and needs further education  HOME EXERCISE PROGRAM:  Pt has HHPT HEP which will likely overlap. (Standing hip abd,  marches, HS curls, heel raises, walking up/down stairs, lunge stretch; supine marches, ankle pumps)  Verbally added: -scar mobilization -assisted heel slides with sheet -SLR - staggered sit to stand with LLE back   ASSESSMENT:  CLINICAL IMPRESSION:  Emphasis of skilled PT session on continued progression of weight acceptance on LLE and work on ROM. Patient tolerated session with low levels of pain and without increase. Patient demonstrates greatest challenge with step up tasks so will continue to work to progress weight shift moving forward. Continue POC.  OBJECTIVE IMPAIRMENTS: decreased activity tolerance, decreased balance, decreased endurance, decreased mobility, difficulty walking, decreased ROM, decreased strength, hypomobility, and postural dysfunction.   ACTIVITY LIMITATIONS: carrying, lifting, squatting, and locomotion level  PARTICIPATION LIMITATIONS: meal prep, cleaning, laundry, medication management, and driving  PERSONAL FACTORS: Age, Fitness, Past/current experiences, and 1 comorbidity: HTN  are also affecting patient's functional outcome.   REHAB POTENTIAL: Excellent  CLINICAL DECISION MAKING: Stable/uncomplicated  EVALUATION COMPLEXITY: Low   GOALS: Goals reviewed with patient? Yes  SHORT TERM GOALS: Target date: 08/20/2023 Pt will be independent and compliant with strength and balance HEP in order to maintain functional progress and improve mobility. Baseline:  To be established. Goal status: INITIAL  2.  Patient will ambulate >/= 250' during to demonstrate improved community ambulation  Baseline:  200' Goal status: REVISED  3.  Pt will decrease 5xSTS to </=15.82 seconds in order to demonstrate decreased risk for falls and improved functional bilateral LE strength and power. Baseline: 20.82 seconds w/ BUE Goal status: INITIAL  4.  Pt will demonstrate TUG of </=19.53 seconds in order to decrease risk of falls and improve functional mobility using  LRAD. Baseline: 24.53 sec w/ SPC SBA Goal status: INITIAL  5.  Pt will demonstrate a gait speed of >/=2.38 feet/sec in order to decrease risk for falls. Baseline: 2.18 ft/sec w/ SPC Goal status: INITIAL  6.  Pt will improve ABC Scale score to >/=29.4% in order to demonstrate improved fear of falling and lowered fall risk factors. Baseline: 19.38% Goal status: INITIAL  PLAN:  PT FREQUENCY: 2x/week (daughter preference)  PT DURATION: 4 weeks  PLANNED INTERVENTIONS: 97164- PT Re-evaluation, 97110-Therapeutic exercises, 97530- Therapeutic activity, [id number]- Neuromuscular re-education, 97535- Self Care, 16109- Manual therapy, [phone]- Gait training, Patient/Family education, Balance training, Stair training, Joint mobilization, Scar mobilization, and DME instructions  PLAN FOR NEXT SESSION:  add PRN to functional strength and balance HEP.  Work on knee flexion. SciFit vs NuStep, quad strengthening, reciprocal gait pattern with increased weight shift to the L and stance time on LLE, work on SLS with acceptance onto LLE  Check all possible CPT codes: 09811 - PT Re-evaluation, 97110- Therapeutic Exercise, [phone]- Neuro Re-education, [phone] - Gait Training, [phone] - Manual Therapy, 97530 - Therapeutic Activities, and 97535 - Self Care    Check all conditions that are expected to impact treatment: Musculoskeletal disorders   If treatment  provided at initial evaluation, no treatment charged due to lack of authorization.   Carmelia Bake, PT, DPT  08/09/2023, 10:26 AM

## 2023-08-12 ENCOUNTER — Ambulatory Visit: Payer: Medicare (Managed Care) | Admitting: Physical Therapy

## 2023-08-16 ENCOUNTER — Ambulatory Visit: Payer: Medicare (Managed Care) | Admitting: Physical Therapy

## 2023-08-19 ENCOUNTER — Ambulatory Visit: Payer: Medicare (Managed Care) | Admitting: Physical Therapy

## 2023-08-23 ENCOUNTER — Ambulatory Visit: Payer: Self-pay | Admitting: Physical Therapy

## 2023-08-24 ENCOUNTER — Ambulatory Visit: Payer: Medicare (Managed Care) | Admitting: Orthopaedic Surgery

## 2023-09-07 ENCOUNTER — Ambulatory Visit: Payer: Medicare (Managed Care) | Admitting: Orthopaedic Surgery

## 2023-09-15 ENCOUNTER — Ambulatory Visit: Payer: Medicaid Other | Admitting: Orthopaedic Surgery

## 2023-09-28 ENCOUNTER — Encounter: Payer: Self-pay | Admitting: Physician Assistant

## 2023-09-28 ENCOUNTER — Ambulatory Visit (INDEPENDENT_AMBULATORY_CARE_PROVIDER_SITE_OTHER): Payer: Medicare (Managed Care) | Admitting: Physician Assistant

## 2023-09-28 DIAGNOSIS — Z96652 Presence of left artificial knee joint: Secondary | ICD-10-CM | POA: Diagnosis not present

## 2023-09-28 NOTE — Progress Notes (Signed)
   Post-Op Visit Note   Patient: Katie Burns           Date of Birth: [birth year]           MRN: [id number] Visit Date: 09/28/2023 PCP: Pcp, No   Assessment & Plan:  Chief Complaint:  Chief Complaint  Patient presents with   Left Knee - Follow-up    Left total knee arthroplasty 06/07/2023   Visit Diagnoses:  1. Status post total left knee replacement     Plan: Patient is a pleasant 77 year old female who comes in today 4 months status post left total knee replacement 06/07/2023.  She has been doing well.  She notes slight discomfort to the distal incision but nothing more.  She has finished physical therapy.  Currently ambulating with a single-point cane.  Examination of the left knee reveals a fully healed surgical scar without complication.  Range of motion 0 to 115 degrees.  Stable valgus varus stress.  She is neurovascularly intact distally.  At this point, she will continue with her home exercise program.  I have discussed getting Mederma or vitamin E to rub over the incision.  She will follow-up with us  in 2 months for repeat evaluation and 2 view x-rays of the left knee.  Call with concerns or questions.  Follow-Up Instructions: Return in about 2 months (around 11/26/2023).   Orders:  No orders of the defined types were placed in this encounter.  No orders of the defined types were placed in this encounter.   Imaging: No new imaging  PMFS History: Patient Active Problem List   Diagnosis Date Noted   Status post total left knee replacement 06/07/2023   Primary osteoarthritis of left knee 01/19/2023   Status post total right knee replacement 10/26/2022   Prediabetes 11/19/2019   Essential hypertension 10/18/2017   Hyperlipidemia 10/18/2017   Obesity (BMI 30.0-34.9) 10/18/2017   Past Medical History:  Diagnosis Date   Arthritis    GERD (gastroesophageal reflux disease)    Hyperlipidemia    Hypertension    Pre-diabetes    in MD's note- 08/06/22- A1C was 5.7     Family History  Problem Relation Age of Onset   Hypertension Daughter    Breast cancer Neg Hx     Past Surgical History:  Procedure Laterality Date   CATARACT EXTRACTION, BILATERAL Bilateral 2017   with lens implant   COLONOSCOPY     EYE SURGERY Bilateral 2015   cataract extractionwith lens implant   TOTAL KNEE ARTHROPLASTY Right 10/26/2022   Procedure: RIGHT TOTAL KNEE ARTHROPLASTY;  Surgeon: Jerri Kay HERO, MD;  Location: [facility];  Service: Orthopedics;  Laterality: Right;   TOTAL KNEE ARTHROPLASTY Left 06/07/2023   Procedure: LEFT TOTAL KNEE ARTHROPLASTY;  Surgeon: Jerri Kay HERO, MD;  Location: [facility];  Service: Orthopedics;  Laterality: Left;   Social History   Occupational History   Occupation: unempolyed  Tobacco Use   Smoking status: Never   Smokeless tobacco: Never  Vaping Use   Vaping status: Never Used  Substance and Sexual Activity   Alcohol use: No   Drug use: No   Sexual activity: Not on file

## 2023-11-26 ENCOUNTER — Ambulatory Visit: Payer: Medicaid Other | Admitting: Physician Assistant

## 2023-12-13 ENCOUNTER — Other Ambulatory Visit: Payer: Self-pay | Admitting: Family Medicine

## 2023-12-13 DIAGNOSIS — Z1231 Encounter for screening mammogram for malignant neoplasm of breast: Secondary | ICD-10-CM

## 2023-12-16 ENCOUNTER — Other Ambulatory Visit: Payer: Self-pay | Admitting: Family Medicine

## 2023-12-16 DIAGNOSIS — Z780 Asymptomatic menopausal state: Secondary | ICD-10-CM

## 2024-05-22 ENCOUNTER — Ambulatory Visit: Payer: Medicare (Managed Care)

## 2024-05-26 ENCOUNTER — Ambulatory Visit: Payer: Medicare (Managed Care)

## 2024-06-01 ENCOUNTER — Ambulatory Visit
Admission: RE | Admit: 2024-06-01 | Discharge: 2024-06-01 | Disposition: A | Discharge status: Home / Self Care | Payer: Medicare (Managed Care) | Source: Ambulatory Visit | Attending: Family Medicine | Admitting: Family Medicine

## 2024-06-01 DIAGNOSIS — Z1231 Encounter for screening mammogram for malignant neoplasm of breast: Secondary | ICD-10-CM
